# Patient Record
Sex: Female | Born: 1940 | Race: Black or African American | Hispanic: No | Smoking: Former smoker
Health system: Southern US, Community
[De-identification: ages and names within clinical notes are randomized; demographics above are authoritative.]

## PROBLEM LIST (undated history)

## (undated) DIAGNOSIS — I10 Essential (primary) hypertension: Secondary | ICD-10-CM

## (undated) DIAGNOSIS — E079 Disorder of thyroid, unspecified: Secondary | ICD-10-CM

## (undated) DIAGNOSIS — M109 Gout, unspecified: Secondary | ICD-10-CM

## (undated) DIAGNOSIS — E119 Type 2 diabetes mellitus without complications: Secondary | ICD-10-CM

## (undated) DIAGNOSIS — N189 Chronic kidney disease, unspecified: Secondary | ICD-10-CM

## (undated) DIAGNOSIS — E785 Hyperlipidemia, unspecified: Secondary | ICD-10-CM

## (undated) DIAGNOSIS — E039 Hypothyroidism, unspecified: Secondary | ICD-10-CM

## (undated) HISTORY — DX: Hyperlipidemia, unspecified: E78.5

## (undated) HISTORY — DX: Type 2 diabetes mellitus without complications: E11.9

## (undated) HISTORY — DX: Chronic kidney disease, unspecified: N18.9

## (undated) HISTORY — DX: Essential (primary) hypertension: I10

## (undated) HISTORY — DX: Disorder of thyroid, unspecified: E07.9

## (undated) HISTORY — PX: OTHER SURGICAL HISTORY: SHX169

## (undated) HISTORY — PX: TUBAL LIGATION: SHX77

## (undated) HISTORY — DX: Gout, unspecified: M10.9

---

## 1998-09-06 ENCOUNTER — Observation Stay: Admission: RE | Admit: 1998-09-06 | Discharge: 1998-09-08 | Payer: Self-pay | Admitting: *Deleted

## 2005-04-21 ENCOUNTER — Other Ambulatory Visit: Payer: Self-pay

## 2005-04-22 ENCOUNTER — Inpatient Hospital Stay: Payer: Self-pay | Admitting: Internal Medicine

## 2005-04-22 ENCOUNTER — Other Ambulatory Visit: Payer: Self-pay

## 2005-04-24 ENCOUNTER — Other Ambulatory Visit: Payer: Self-pay

## 2007-03-08 ENCOUNTER — Other Ambulatory Visit: Payer: Self-pay

## 2007-03-08 ENCOUNTER — Emergency Department: Payer: Self-pay | Admitting: Emergency Medicine

## 2011-03-25 ENCOUNTER — Emergency Department: Payer: Self-pay

## 2011-03-25 LAB — URINALYSIS, COMPLETE
Bilirubin,UR: NEGATIVE
Blood: NEGATIVE
Glucose,UR: >=500 mg/dL (ref 0–75)
RBC,UR: 1 /HPF (ref 0–5)
Specific Gravity: 1.007 (ref 1.003–1.030)
Squamous Epithelial: 3
WBC UR: 12 /HPF (ref 0–5)

## 2012-04-28 ENCOUNTER — Emergency Department: Payer: Self-pay | Admitting: Emergency Medicine

## 2012-04-28 LAB — CBC WITH DIFFERENTIAL/PLATELET
Basophil #: 0.1 10*3/uL (ref 0.0–0.1)
Basophil %: 0.9 %
Eosinophil #: 0.2 10*3/uL (ref 0.0–0.7)
Eosinophil %: 1.9 %
HCT: 33.9 % — ABNORMAL LOW (ref 35.0–47.0)
Lymphocyte #: 1.6 10*3/uL (ref 1.0–3.6)
Lymphocyte %: 15.8 %
MCH: 26 pg (ref 26.0–34.0)
MCV: 81 fL (ref 80–100)
Monocyte %: 7.4 %
Neutrophil #: 7.6 10*3/uL — ABNORMAL HIGH (ref 1.4–6.5)
Neutrophil %: 74 %
Platelet: 367 10*3/uL (ref 150–440)
RBC: 4.2 10*6/uL (ref 3.80–5.20)
RDW: 15.7 % — ABNORMAL HIGH (ref 11.5–14.5)
WBC: 10.3 10*3/uL (ref 3.6–11.0)

## 2012-04-28 LAB — BASIC METABOLIC PANEL
BUN: 23 mg/dL — ABNORMAL HIGH (ref 7–18)
Calcium, Total: 8.9 mg/dL (ref 8.5–10.1)
Chloride: 111 mmol/L — ABNORMAL HIGH (ref 98–107)
Co2: 23 mmol/L (ref 21–32)
Creatinine: 1.77 mg/dL — ABNORMAL HIGH (ref 0.60–1.30)
EGFR (African American): 33 — ABNORMAL LOW
Potassium: 3.3 mmol/L — ABNORMAL LOW (ref 3.5–5.1)
Sodium: 141 mmol/L (ref 136–145)

## 2012-04-28 LAB — URIC ACID: Uric Acid: 11.6 mg/dL — ABNORMAL HIGH (ref 2.6–6.0)

## 2012-04-28 LAB — SEDIMENTATION RATE: Erythrocyte Sed Rate: 45 mm/hr — ABNORMAL HIGH (ref 0–30)

## 2012-06-27 ENCOUNTER — Inpatient Hospital Stay: Payer: Self-pay | Admitting: Internal Medicine

## 2012-06-27 LAB — URINALYSIS, COMPLETE
Bilirubin,UR: NEGATIVE
Glucose,UR: 150 mg/dL (ref 0–75)
Ketone: NEGATIVE
Ph: 5 (ref 4.5–8.0)
Protein: 30
RBC,UR: 3 /HPF (ref 0–5)
Specific Gravity: 1.011 (ref 1.003–1.030)
Squamous Epithelial: 7
WBC UR: 19 /HPF (ref 0–5)

## 2012-06-27 LAB — COMPREHENSIVE METABOLIC PANEL
Albumin: 3.3 g/dL — ABNORMAL LOW (ref 3.4–5.0)
Alkaline Phosphatase: 191 U/L — ABNORMAL HIGH (ref 50–136)
Anion Gap: 9 (ref 7–16)
BUN: 47 mg/dL — ABNORMAL HIGH (ref 7–18)
EGFR (African American): 19 — ABNORMAL LOW
Glucose: 474 mg/dL — ABNORMAL HIGH (ref 65–99)
Osmolality: 301 (ref 275–301)
Potassium: 4.1 mmol/L (ref 3.5–5.1)
SGOT(AST): 30 U/L (ref 15–37)
SGPT (ALT): 17 U/L (ref 12–78)
Sodium: 134 mmol/L — ABNORMAL LOW (ref 136–145)

## 2012-06-27 LAB — CBC
HCT: 46.3 % (ref 35.0–47.0)
MCH: 25 pg — ABNORMAL LOW (ref 26.0–34.0)
Platelet: 369 10*3/uL (ref 150–440)
RDW: 15.3 % — ABNORMAL HIGH (ref 11.5–14.5)

## 2012-06-27 LAB — TROPONIN I: Troponin-I: 0.16 ng/mL — ABNORMAL HIGH

## 2012-06-28 LAB — CBC WITH DIFFERENTIAL/PLATELET
Eosinophil: 1 %
Lymphocytes: 13 %
MCH: 25 pg — ABNORMAL LOW (ref 26.0–34.0)
MCHC: 31.1 g/dL — ABNORMAL LOW (ref 32.0–36.0)
Platelet: 267 10*3/uL (ref 150–440)
RBC: 4.57 10*6/uL (ref 3.80–5.20)
RDW: 15.7 % — ABNORMAL HIGH (ref 11.5–14.5)
Segmented Neutrophils: 83 %
WBC: 21.4 10*3/uL — ABNORMAL HIGH (ref 3.6–11.0)

## 2012-06-28 LAB — URINE CULTURE

## 2012-06-28 LAB — BASIC METABOLIC PANEL
Anion Gap: 6 — ABNORMAL LOW (ref 7–16)
BUN: 38 mg/dL — ABNORMAL HIGH (ref 7–18)
Calcium, Total: 8.1 mg/dL — ABNORMAL LOW (ref 8.5–10.1)
Chloride: 106 mmol/L (ref 98–107)
Co2: 29 mmol/L (ref 21–32)
Creatinine: 2.26 mg/dL — ABNORMAL HIGH (ref 0.60–1.30)
EGFR (African American): 25 — ABNORMAL LOW
Glucose: 57 mg/dL — ABNORMAL LOW (ref 65–99)
Osmolality: 288 (ref 275–301)
Potassium: 3.8 mmol/L (ref 3.5–5.1)
Sodium: 141 mmol/L (ref 136–145)

## 2012-06-29 LAB — CBC WITH DIFFERENTIAL/PLATELET
Bands: 2 %
Basophil: 2 %
Lymphocytes: 18 %
MCH: 25.8 pg — ABNORMAL LOW (ref 26.0–34.0)
MCHC: 32.1 g/dL (ref 32.0–36.0)
MCV: 80 fL (ref 80–100)
Metamyelocyte: 1 %
Monocytes: 4 %
RDW: 15.9 % — ABNORMAL HIGH (ref 11.5–14.5)
Segmented Neutrophils: 73 %
WBC: 17.3 10*3/uL — ABNORMAL HIGH (ref 3.6–11.0)

## 2012-06-29 LAB — BASIC METABOLIC PANEL
BUN: 18 mg/dL (ref 7–18)
Co2: 25 mmol/L (ref 21–32)
Creatinine: 1.52 mg/dL — ABNORMAL HIGH (ref 0.60–1.30)
EGFR (African American): 40 — ABNORMAL LOW
EGFR (Non-African Amer.): 34 — ABNORMAL LOW
Osmolality: 280 (ref 275–301)
Potassium: 4 mmol/L (ref 3.5–5.1)
Sodium: 141 mmol/L (ref 136–145)

## 2012-06-29 LAB — HEMOGLOBIN A1C

## 2012-06-30 LAB — CREATININE, SERUM
EGFR (African American): 47 — ABNORMAL LOW
EGFR (Non-African Amer.): 41 — ABNORMAL LOW

## 2012-07-03 LAB — CULTURE, BLOOD (SINGLE)

## 2012-08-20 ENCOUNTER — Ambulatory Visit: Payer: Self-pay | Admitting: Family Medicine

## 2013-05-04 ENCOUNTER — Inpatient Hospital Stay: Payer: Self-pay | Admitting: Internal Medicine

## 2013-05-04 LAB — URINALYSIS, COMPLETE
BACTERIA: NONE SEEN
Bilirubin,UR: NEGATIVE
Glucose,UR: >=500 mg/dL (ref 0–75)
Nitrite: NEGATIVE
Ph: 5 (ref 4.5–8.0)
Protein: NEGATIVE
RBC,UR: 29 /HPF (ref 0–5)
Specific Gravity: 1.015 (ref 1.003–1.030)
WBC UR: 38 /HPF (ref 0–5)

## 2013-05-04 LAB — CBC
HCT: 44.9 % (ref 35.0–47.0)
HGB: 13.6 g/dL (ref 12.0–16.0)
MCH: 27.2 pg (ref 26.0–34.0)
MCHC: 30.3 g/dL — AB (ref 32.0–36.0)
MCV: 90 fL (ref 80–100)
Platelet: 233 10*3/uL (ref 150–440)
RBC: 5 10*6/uL (ref 3.80–5.20)
RDW: 15.7 % — AB (ref 11.5–14.5)
WBC: 14.2 10*3/uL — ABNORMAL HIGH (ref 3.6–11.0)

## 2013-05-04 LAB — BASIC METABOLIC PANEL
ANION GAP: 12 (ref 7–16)
BUN: 60 mg/dL — AB (ref 7–18)
Calcium, Total: 9.5 mg/dL (ref 8.5–10.1)
Chloride: 104 mmol/L (ref 98–107)
Co2: 18 mmol/L — ABNORMAL LOW (ref 21–32)
Creatinine: 2.84 mg/dL — ABNORMAL HIGH (ref 0.60–1.30)
EGFR (Non-African Amer.): 16 — ABNORMAL LOW
GFR CALC AF AMER: 19 — AB
Glucose: 819 mg/dL (ref 65–99)
Osmolality: 325 (ref 275–301)
Potassium: 4.7 mmol/L (ref 3.5–5.1)
Sodium: 134 mmol/L — ABNORMAL LOW (ref 136–145)

## 2013-05-04 LAB — COMPREHENSIVE METABOLIC PANEL
Albumin: 3.5 g/dL (ref 3.4–5.0)
Alkaline Phosphatase: 192 U/L — ABNORMAL HIGH
Anion Gap: 13 (ref 7–16)
BUN: 62 mg/dL — AB (ref 7–18)
Bilirubin,Total: 0.6 mg/dL (ref 0.2–1.0)
CALCIUM: 8.6 mg/dL (ref 8.5–10.1)
Chloride: 99 mmol/L (ref 98–107)
Co2: 14 mmol/L — ABNORMAL LOW (ref 21–32)
Creatinine: 2.83 mg/dL — ABNORMAL HIGH (ref 0.60–1.30)
EGFR (African American): 19 — ABNORMAL LOW
EGFR (Non-African Amer.): 16 — ABNORMAL LOW
Glucose: 1054 mg/dL (ref 65–99)
OSMOLALITY: 324 (ref 275–301)
POTASSIUM: 5.6 mmol/L — AB (ref 3.5–5.1)
SGOT(AST): 23 U/L (ref 15–37)
SGPT (ALT): 13 U/L (ref 12–78)
SODIUM: 126 mmol/L — AB (ref 136–145)
Total Protein: 7.4 g/dL (ref 6.4–8.2)

## 2013-05-04 LAB — BETA-HYDROXYBUTYRIC ACID: Beta-Hydroxybutyrate: 22 mg/dL — ABNORMAL HIGH (ref 0.2–2.8)

## 2013-05-04 LAB — LIPASE, BLOOD: LIPASE: 49 U/L — AB (ref 73–393)

## 2013-05-04 LAB — TROPONIN I: Troponin-I: <0.02 ng/mL

## 2013-05-05 LAB — BASIC METABOLIC PANEL
Anion Gap: 5 — ABNORMAL LOW (ref 7–16)
BUN: 55 mg/dL — ABNORMAL HIGH (ref 7–18)
CO2: 19 mmol/L — AB (ref 21–32)
Calcium, Total: 9.2 mg/dL (ref 8.5–10.1)
Chloride: 119 mmol/L — ABNORMAL HIGH (ref 98–107)
Creatinine: 2.39 mg/dL — ABNORMAL HIGH (ref 0.60–1.30)
EGFR (African American): 23 — ABNORMAL LOW
EGFR (Non-African Amer.): 20 — ABNORMAL LOW
Glucose: 246 mg/dL — ABNORMAL HIGH (ref 65–99)
Osmolality: 308 (ref 275–301)
POTASSIUM: 4.2 mmol/L (ref 3.5–5.1)
Sodium: 143 mmol/L (ref 136–145)

## 2013-05-06 LAB — HEMOGLOBIN A1C

## 2013-05-07 LAB — BASIC METABOLIC PANEL
ANION GAP: 5 — AB (ref 7–16)
BUN: 18 mg/dL (ref 7–18)
CHLORIDE: 116 mmol/L — AB (ref 98–107)
CO2: 21 mmol/L (ref 21–32)
Calcium, Total: 8.7 mg/dL (ref 8.5–10.1)
Creatinine: 1.76 mg/dL — ABNORMAL HIGH (ref 0.60–1.30)
EGFR (African American): 33 — ABNORMAL LOW
EGFR (Non-African Amer.): 29 — ABNORMAL LOW
Glucose: 195 mg/dL — ABNORMAL HIGH (ref 65–99)
OSMOLALITY: 290 (ref 275–301)
POTASSIUM: 4.8 mmol/L (ref 3.5–5.1)
SODIUM: 142 mmol/L (ref 136–145)

## 2013-11-03 LAB — CBC AND DIFFERENTIAL
HCT: 37 % (ref 36–46)
Hemoglobin: 11.6 g/dL — AB (ref 12.0–16.0)
Platelets: 326 10*3/uL (ref 150–399)
WBC: 9 10^3/mL

## 2014-04-13 DIAGNOSIS — I10 Essential (primary) hypertension: Secondary | ICD-10-CM | POA: Diagnosis not present

## 2014-04-13 DIAGNOSIS — N183 Chronic kidney disease, stage 3 (moderate): Secondary | ICD-10-CM | POA: Diagnosis not present

## 2014-04-13 DIAGNOSIS — D649 Anemia, unspecified: Secondary | ICD-10-CM | POA: Diagnosis not present

## 2014-04-13 DIAGNOSIS — E1122 Type 2 diabetes mellitus with diabetic chronic kidney disease: Secondary | ICD-10-CM | POA: Diagnosis not present

## 2014-05-18 DIAGNOSIS — I10 Essential (primary) hypertension: Secondary | ICD-10-CM | POA: Diagnosis not present

## 2014-05-18 DIAGNOSIS — N183 Chronic kidney disease, stage 3 (moderate): Secondary | ICD-10-CM | POA: Diagnosis not present

## 2014-05-18 DIAGNOSIS — R809 Proteinuria, unspecified: Secondary | ICD-10-CM | POA: Diagnosis not present

## 2014-05-18 DIAGNOSIS — E1122 Type 2 diabetes mellitus with diabetic chronic kidney disease: Secondary | ICD-10-CM | POA: Diagnosis not present

## 2014-06-29 DIAGNOSIS — N183 Chronic kidney disease, stage 3 (moderate): Secondary | ICD-10-CM | POA: Diagnosis not present

## 2014-06-29 DIAGNOSIS — E1022 Type 1 diabetes mellitus with diabetic chronic kidney disease: Secondary | ICD-10-CM | POA: Diagnosis not present

## 2014-06-29 DIAGNOSIS — E039 Hypothyroidism, unspecified: Secondary | ICD-10-CM | POA: Diagnosis not present

## 2014-07-01 ENCOUNTER — Other Ambulatory Visit: Payer: Self-pay | Admitting: *Deleted

## 2014-07-01 ENCOUNTER — Encounter: Payer: Self-pay | Admitting: *Deleted

## 2014-07-01 NOTE — Patient Outreach (Signed)
[facility]) Care Management  07/01/2014  Caitlyn Marshall 1940/04/25 [id number]   [employer] Tier 4 referral:   Telephone call to patient. Advised patient of reason for call and of [facility] care management services.  States she has received [facility] brochure and magnet with contact numbers.  States seeing diabetic doctor several times yearly and gets blood work done every 3 months.  Seeing primary care doctor for regular checks 1-2 times yearly. Family member takes her to doctors' appointments.   Uses local pharmacy for medications. States taking medications as prescribed by her doctors.  Patient states she has attended diabetic education classes in the past.  Knows action plan for low blood sugar.  States blood sugars have been within normal limits. Checking three times daily.   Patient states she has no health care concerns currently and does not need [facility] services at this time.  Patient advised of [facility] contact numbers, including 24 hour Nurse Advice line. Patient voices understanding.  Will close case. Send closure letter to MD.   Sherrin Daisy, [place] Management Coordinator [facility] Care Management  [phone]

## 2014-07-08 NOTE — Consult Note (Signed)
PATIENT NAME:  Caitlyn Marshall, Caitlyn Marshall MR#:  [id number] DATE OF BIRTH:  November 10, 1940  DATE OF CONSULTATION:  06/30/2012  REFERRING PHYSICIAN:  Gladstone Lighter, MD CONSULTING PHYSICIAN:  A. Lavone Orn, MD  CHIEF COMPLAINT: Uncontrolled diabetes.   HISTORY OF PRESENT ILLNESS: This is a 74 year old female seen in consultation at the request of Dr. Tressia Miners for uncontrolled diabetes. The patient was admitted on 06/28/2011 with initial findings of elevated blood sugar of over 500, hypotension, elevated white count and acute renal failure. She had been experiencing several weeks of dysphagia. She has since been undergone an EGD showing esophageal candidiasis. Dysphagia has had improved in the last day or two. She is now able to eat a little bit more. She estimates she is eating about half the amount she was prior to hospitalization, when she was feeling well.  She has lost 10 pounds in recent weeks due to dysphagia. She has had diabetes since 1998. She typically takes Novolin 70/30, 24 units at breakfast and 14 units at supper. Her primary care physician is Dr. Margarita Rana. Her initial hemoglobin A1c was found to be greater than 16%. She checks her blood sugars 3 to 4 times per week. She denies having had hypoglycemia prior to hospitalization. Since hospitalization, she had been on a regimen of 70/30 mix 24 units at breakfast and 14 units at bedtime. For the last several mornings, she has woken with fasting hypoglycemia. She has also had a problem with afternoon and evening highs. The last 24 hours, blood sugars have ranged from 63 to 322 and she has received a total of 38 units of 70/30 mix in addition to 14 units of NovoLog.   PAST MEDICAL HISTORY: 1.  Type 2 diabetes.  2.  Hypertension.  3.  Hyperlipidemia.  4.  Pancreatitis.  5.  Gout.   SOCIAL HISTORY: No tobacco or illicit drug use.   FAMILY HISTORY: Positive for diabetes.   CURRENT INPATIENT MEDICATIONS:  1.  Novolin 70/30 mix 24 units at  breakfast and 14 units at bedtime.  2.  Lovastatin 20 mg daily.  3.  Levothyroxine 50 mcg daily.  4.  Pantoprazole 40 mg daily.  5.  Aspirin 81 mg daily.  6.  Ceftriaxone 1 gram daily.  7.  Diflucan 100 mg daily.  8.  D5 NS at 75 mL/hour.   9.  NovoLog insulin sliding scale.   REVIEW OF SYSTEMS:  GENERAL: She has had weight loss. She denies fevers.  HEENT: She denies blurred vision. She does have dysphagia.  NECK: She has no neck pain.  CARDIAC: Denies chest pain or palpitation.  PULMONARY: Denies cough or shortness of breath.  ABDOMEN: Denies abdominal pain. Denies change in bowel habits.  EXTREMITIES: Denies leg swelling.  NEUROLOGIC:  She denies numbness or paresthesias in the extremities. She denies recent falls.  SKIN: Denies rash or recent skin changes.  ENDOCRINE:  Denies heat or cold intolerance.   PHYSICAL EXAMINATION: VITAL SIGNS: Height 65.9 inches, weight 91.6 pounds, BMI 14.8.  GENERAL: Thin African American female in no acute distress.  HEENT: Extraocular movements are intact.  Oropharynx is clear.  Mucous membranes moist.  NECK: Supple. No thyromegaly.  CARDIAC: Regular rate and rhythm without murmur.  PULMONARY: Clear to auscultation bilaterally. No wheeze.  ABDOMEN: Diffusely soft, nontender, nondistended.  EXTREMITIES: No edema is present.  PSYCHIATRIC: Alert and oriented x 3, calm and cooperative.  SKIN: No rash or dermatopathy noted.   LABORATORY DATA: Creatinine 1.32, EGFR 47, WBC 14.7. On  06/27/2012, hemoglobin A1c 15.8%.   ASSESSMENT: Severely uncontrolled diabetes with A1c of over 15.8%, currently with both hyper and hypoglycemia.  I suspect that her decreased oral intake as well as bedtime dosing of 70/30 mix have both contributed to hypoglycemia, whereas recent addition of D5 has contributed to hyperglycemia.   RECOMMENDATIONS: 1. Will discontinue her D5 NS.  2. Will hold her bedtime 70/30 mix tonight.  3. Tomorrow, we will change 70/30 mix 20 units  in the morning and 7 units evening, both to be given at meals.  4.  Continue NovoLog sliding scale.  5. Continue before meals and at bedtime blood sugar monitoring. After discharge, she should also be encouraged to check blood sugars on a regular basis, preferably at least twice a day.   Thank you for the kind request for consultation. I will follow along with you.   ____________________________ A. Lavone Orn, MD ams:cc D: 06/30/2012 17:13:21 ET T: 06/30/2012 18:05:36 ET JOB#: [id number]  cc: A. Lavone Orn, MD, <Dictator> Sherlon Handing MD ELECTRONICALLY SIGNED 07/05/2012 8:20

## 2014-07-08 NOTE — Consult Note (Signed)
CC: dysphagia.  Pt with problem of food hanging up in mid and lower esophagus, also hx of heartburn and reflux.  She may have esophageal reflux induced strictures.  Plan EGD tomorrow.  May need colonoscopy later due to abn CT scan.  Full consult to follow tomorrow morning and EGD later tomorrow.  Electronic Signatures: Manya Silvas (MD)  (Signed on 13-Apr-14 16:58)  Authored  Last Updated: 13-Apr-14 16:58 by Manya Silvas (MD)

## 2014-07-08 NOTE — H&P (Signed)
PATIENT NAME:  Caitlyn Marshall, Caitlyn Marshall MR#:  [id number] DATE OF BIRTH:  06-01-1940  DATE OF ADMISSION:  06/27/2012  PRIMARY CARE PHYSICIAN: Jerrell Belfast, MD  REFERRING PHYSICIAN: Sheryl L. Benjaman Lobe, MD  CHIEF COMPLAINT: High blood sugar today.   HISTORY OF PRESENT ILLNESS: The patient is a 74 year old African American female with a history of hypertension, diabetes, questionable chronic pancreatitis, hyperlipidemia, gout, presented to the ED with a high blood sugar today. Blood sugar was 530 today. In addition, the patient complains of abdominal pain, nausea, vomiting multiple times. Actually the patient has poor oral intake recently, feels generalized weakness, but the patient denies any fever or chills. No headache but has some dizziness, but denies any diarrhea, melena or bloody stool. No dysuria or hematuria. The patient denies any cough, phlegm, shortness of breath. She was noted to have a high white count of 23, was treated with Zosyn. Since the patient's blood pressure was on the lower side, the patient was treated with normal saline 2 liters in ED.   PAST MEDICAL HISTORY: Hypertension, diabetes, chronic pancreatitis, hyperlipidemia, gout.   FAMILY HISTORY: Hypertension and diabetes.   SOCIAL HISTORY: No smoking or drinking or illicit drugs.   HOME MEDICATIONS: Tribenzor 10 mg/25 mg p.o. daily, Novolin 70/30 with 24 units subcutaneously in the morning and 14 units at bedtime, lovastatin 20 mg p.o. daily at bedtime, levothyroxine 50 mcg p.o. daily, aspirin 81 mg p.o. daily.   REVIEW OF SYSTEMS:    CONSTITUTIONAL: The patient denies any fever or chills. No headache but has dizziness and generalized weakness and poor oral intake.  EYES: No double vision or blurred vision.  EARS, NOSE, THROAT: No postnasal drip, slurred speech or dysphagia. No epistaxis.  CARDIOVASCULAR: No chest pain, palpitation, orthopnea or nocturnal dyspnea. No leg edema.  PULMONARY: No cough, sputum, shortness of breath  or hemoptysis.  GASTROINTESTINAL: Positive for abdominal pain, nausea, vomiting, but no diarrhea, melena or bloody stool.  GENITOURINARY: No dysuria, hematuria or incontinence.  SKIN: No rash or jaundice.  NEUROLOGIC: No syncope, loss of consciousness or seizure.  HEMATOLOGIC: No easy bruising or bleeding.  ENDOCRINE: No polyuria, polydipsia, heat or cold intolerance.   PHYSICAL EXAMINATION:  VITAL SIGNS: Temperature 97.7, blood pressure 82/54, pulse 90, O2 saturation 99% on room air.  GENERAL: This patient is alert, awake, oriented, in no acute distress.  HEENT: Pupils round, equal, reactive to light and accommodation. Mouth: Dry oral mucosa. Clear oropharynx.  NECK: Supple. No JVD or carotid bruits. No lymphadenopathy. No thyromegaly.  CARDIOVASCULAR: S1, S2 regular rate, rhythm. No murmurs, gallops.  PULMONARY: Bilateral air entry. No wheezing or rales. No use of accessory muscles to breathe.  ABDOMEN: Soft. No distention or tenderness. No organomegaly. Bowel sounds present  EXTREMITIES: No edema, clubbing or cyanosis. No calf tenderness. Strong bilateral pedal pulses.  SKIN: No rash or jaundice.  NEUROLOGIC: A and O x 3. No focal deficits. Power 5/5. Sensation intact.   LABORATORY, DIAGNOSTIC AND RADIOLOGICAL DATA: CAT scan of abdomen and pelvis without contrast: No acute findings in the abdomen or pelvis. Findings of chronic pancreatitis and there is a focal area of mild bowel wall thickening in the transverse colon. Urinalysis showed WBC 19, RBC 3, nitrite negative. Abdominal ultrasound: No acute findings of gallbladder. Chest x-ray unremarkable, no acute cardiopulmonary disease. WBC 23.8, hemoglobin 14.4, platelets 369. Glucose 474 and then decreased to 142, BUN 47, creatinine 2.84, sodium 134, potassium 4.1, chloride 96, bicarbonate 29. Troponin 0.16. TSH 3.19. BNP 3367. Hemoglobin  A1c more than 16. Lipase 31. EKG showed normal sinus rhythm at 88 BPM.   IMPRESSION:  1.  Hyperglycemia.  uncontrolled diabetes.  2.  Abdominal pain, possible chronic pancreatitis.  3.  Hypotension.  4.  Acute renal failure with dehydration, questionable chronic kidney disease. The patient's creatinine was 1.77 and this time increased to 2.84. BUN was 23 2 months ago and now is 47.  5.  Urinary tract infection.  6.  Leukocytosis.  7.  Elevated troponin, which is possibly due to acute renal failure.  8.  History of hypertension.  9.  Hyperlipidemia.  10.  History of gout.   PLAN OF TREATMENT:  1.  The patient will be admitted to medical floor. We will give normal saline bolus, then 100 mL/hour if blood pressure is stable and follow up BMP. Hold Tribenzor due to hypotension and acute renal failure.  2.  For UTI, we will start Rocephin. Follow up blood culture, urine culture, CBC.  3.  For elevated troponin, which is possibly due to acute renal failure, we will follow up troponin. Continue aspirin and statin. 4.  For chronic pancreatitis and thickening of colon wall, we will get a GI consult.  5.  For hyperglycemia and uncontrolled diabetes, we will start sliding scale. Continue Novolin 70/30. Closely monitor her blood sugar and adjust insulin.  6.  GI and DVT prophylaxis.  7.  I discussed the patient's condition and the plan of treatment with the patient and the patient's family member, also discussed the patient's code status. The patient wants full code.   TIME SPENT: About 62 minutes.   ____________________________ Demetrios Loll, MD qc:jm D: 06/27/2012 16:41:01 ET T: 06/27/2012 17:41:27 ET JOB#: [id number]  cc: Demetrios Loll, MD, <Dictator> Demetrios Loll MD ELECTRONICALLY SIGNED 06/28/2012 17:26

## 2014-07-08 NOTE — Consult Note (Signed)
Pt CC is dysphagia.  She was dilated and started on medicine for candida.  Please continue this for 7-10 days.  I will sign off.  Electronic Signatures: Manya Silvas (MD)  (Signed on 15-Apr-14 17:22)  Authored  Last Updated: 15-Apr-14 17:22 by Manya Silvas (MD)

## 2014-07-08 NOTE — Consult Note (Signed)
Chief Complaint and History:  Referring Physician Dr. Tressia Miners   Chief Complaint Uncontrolled diabetes   Allergies:  No Known Allergies:   Assessment/Plan:  Assessment/Plan yo yo F with a 32 yr h/o diabetes was admitted with several weeks of dysphagia due to candida esophagitis. Hgb A1c on admit was 15.8%. Home regimen is 70/30 Mix 24 units at breakfast and 14 units at supper. She was given the 70/30 Mix 24 units in AM and HS here and was having fasting lows in the 60s. D5 added yesterday and sugars in evenings are high in the 250-320 range. She is eating some, but not regular amount. She is quite underweight.  A/ Uncontrolled diabetes with hypoglycemia  P/ Agree with encouraging inc'd PO intake Stop D5 NS Change PM dose of 70/30 Mix to supper. Will hold today and restart tomorrow PM. Reduce AM dose to 20 units. Continue NovoLog SSI.  I will follow with you and am available for out-pt follow-up also, if needed. Full consult has been dictated.   Case Discussed With patient   Electronic Signatures: Judi Cong (MD)  (Signed 15-Apr-14 17:21)  Authored: Chief Complaint and History, ALLERGIES, Assessment/Plan   Last Updated: 15-Apr-14 17:21 by Judi Cong (MD)

## 2014-07-08 NOTE — Discharge Summary (Signed)
PATIENT NAME:  Caitlyn Marshall, Caitlyn Marshall MR#:  [id number] DATE OF BIRTH:  10/09/1940  DATE OF ADMISSION:  06/27/2012 DATE OF DISCHARGE:  07/02/2012  ADMITTING PHYSICIAN:  Demetrios Loll, MD  DISCHARGING PHYSICIAN:  Gladstone Lighter, MD  PRIMARY CARE PHYSICIAN: Margarita Rana, MD  [place]: 1.  GI consultation by Dr. Vira Agar and Dr. Candace Cruise. 2.  Endocrinology consultation with Dr. Gabriel Carina.  DISCHARGE DIAGNOSES: 1.  Dysphagia secondary to Candida esophagitis on esophagogastroduodenoscopy. 2.  Uncontrolled diabetes mellitus with hemoglobin A1c of 15.8 with hypo and hyperglycemic episodes in the hospital.  3.  Urinary tract infection.  4.  Hypertension.  5.  Hypothyroidism.  6.  Chronic kidney disease, stage III.  DISCHARGE MEDICATIONS:  1.  Levothyroxine 50 mcg a few daily.  2.  Aspirin 81 mg p.o. daily.  3.  Fluconazole 100 mg p.o. daily for 8 more days.  4.  Protonix 40 mg p.o. daily.  5.  Tribenzor 10/25, 40 mg half tablet p.o. daily.  6.  Lantus 8 units subcutaneous at bedtime.  7.  Aspart insulin 6 units 3 times a day before meals.  8.  Aspart insulin protamine sliding scale insulin 1 unit for every 50 units increase 50 three times a day with meals.   DISCHARGE DIET: ADA 1800 diet.   DISCHARGE ACTIVITY: As tolerated. Dietary supplements Glucerna twice a day.    FOLLOWUP INSTRUCTIONS: 1.  GI follow up in 2 weeks.  Will need outpatient colonoscopy. 2.  Follow up with Dr. Gabriel Carina.  Appointment scheduled for 07/22/2012 at 11:15 a.m.  3.  PCP follow-up in 2 to 3 weeks.   LABORATORY AND DIAGNOSTIC DATA:  His hemoglobin A1C as mentioned above is 15.8.  Budding yeast from upper endoscopy brushings seen. Sodium 141, potassium 4.0, chloride 11, bicarb 25, BUN 18, creatinine 1.5, glucose 49 and calcium of 7.9.  WBC 17.3, hemoglobin 10.9, hematocrit 34.1, platelet count 255. WBC normalized to 11.4 at the time of discharge.   BRIEF HOSPITAL COURSE:  The patient is a 74 year old elderly  African American female with past medical history significant for diabetes, hypertension and chronic pancreatitis who follows with Dr. Venia Minks as an outpatient and presented to the hospital secondary to couple of weeks of weakness, poor p.o. intake, dysphagia and also elevated blood sugars at home.   1.  Dysphagia with poor p.o. intake going on for a couple of weeks. Seen by GI, had an upper GI endoscopy done; no strictures were found but she did have a diffuse Candida esophagitis and was started on fluconazole.  Still has pain on swallowing but much improved and she is recommended to have an outpatient follow-up for the same.  Also of note, she was noted to have thickening of her transverse colon on the CT of the abdomen done and never had a colonoscopy done. The patient is reluctant to get a colonoscopy done at this time but outpatient follow-up is recommended and she is agreeable for that. Also with the weight loss, failure to thrive, poor appetite, colon cancer need to be ruled out.  2.  Diabetes mellitus with hypo and hyperglycemic episodes while in the hospital. The patient's sugars were erratic, ranging from hypoglycemia in the 30s to hyperglycemia in the 500s. Her hemoglobin A1c is elevated at 15.8.  Sugars were hard to control with 70/30 insulin.  She was seen by endocrinologist, Dr. Gabriel Carina who adjusted her insulin and changed it to Lantus and also premeal insulin with sliding scale insulin.  The patient  said that she would be comfortable taking as she has been on insulin for several years. Prescription for new glucometer strips and Lantus is given and she has a follow-up appointment with Dr. Gabriel Carina in the next few weeks.  3.  Hypertension.  Her home medication was continued at half dose because her blood pressure has been stable while in the hospital.  4.  Hypothyroidism.  Continue her Synthroid.  5. Chronic kidney disease, stage III, appeared stable here. 6.  Urinary tract infection. The patient's  cultures were negative. The patient finished a 5-days course of Rocephin while in the hospital.   DISCHARGE CONDITION: Stable.   DISCHARGE DISPOSITION: Home.   Time spent on discharge: 40 minutes.  Also of note, the patient refused home health.  At the time of discharge she was able to ambulate fine.     ____________________________ Gladstone Lighter, MD rk:ct D: 07/02/2012 15:37:50 ET T: 07/02/2012 17:03:54 ET JOB#: [id number]  cc: Gladstone Lighter, MD, <Dictator> Jerrell Belfast, MD Gladstone Lighter MD ELECTRONICALLY SIGNED 07/03/2012 15:54

## 2014-07-08 NOTE — Consult Note (Signed)
EGD done for Dr. Vira Agar. Prob candidal esophagitis. Cytology brushings done. Start diflucan daily x 5 days. Hold lovastatin while getting diflucan since there is major drug interaction. Though no obvious esophageal stricture, 54 Fr maloney dilator passed. Suspect esophagitis cause of dysphagia. 1800 cal diet ordered. Dr. Vira Agar to see patient tomorrow. Thanks.  Electronic Signatures: Verdie Shire (MD)  (Signed on 14-Apr-14 12:59)  Authored  Last Updated: 14-Apr-14 12:59 by Verdie Shire (MD)

## 2014-07-08 NOTE — Consult Note (Signed)
PATIENT NAME:  Caitlyn Marshall, Caitlyn Marshall MR#:  [id number] DATE OF BIRTH:  July 07, 1940  DATE OF CONSULTATION:  06/29/2012  REFERRING PHYSICIAN:   CONSULTING PHYSICIAN:  Manya Silvas, MD  HISTORY OF PRESENT ILLNESS:  The patient is a 74 year old black female who was admitted to the hospital with very elevated blood sugar of 530. Also complaining of abdominal pain, nausea and multiple of vomiting spells with poor oral intake recently She had leukocytosis and was admitted to the hospital for all these problems. I was asked to see her because of history of chronic pancreatitis and problems with dysphasia. She also had a CAT scan that showed a focal area of mild bowel wall thickening in the transverse colon and it has been more than 10 years since she has had an upper endoscopy or colonoscopy so I was consulted to see her.   PAST MEDICAL HISTORY: Diabetes since 1998, chronic pancreatitis, hyperlipidemia, gout and hypertension.   HABITS: Used to drink vodka 1/2 pint or more a day, quit several years ago.  Quit smoking cigarettes 15 to 20 years ago.   HOME MEDICATIONS: 1.  Tribenzor 10/25 mg 1 a day.  2.  Novolin insulin 24 units of 70/30 in the morning and 14 units at bedtime.  3.  Lovastatin 20 mg at bedtime.  4.  Levothyroxine 50 mcg a day.  5.  Aspirin 81 mg a day.   REVIEW OF SYSTEMS:  No chills or fever. Some dizziness and weakness. She has had decreased oral intake and maybe lost 10 pounds over the last several weeks. She had vomiting a couple of days ago. She has vomiting off and on. No asthma or wheezing. No chest pains. She runs a somewhat low blood pressure, however.  No dysuria or hematuria.   PHYSICAL EXAMINATION:  GENERAL:  A thin black female in no acute distress, pleasant.  HEENT: Sclerae anicteric. Conjunctivae negative. Tongue negative. Head is atraumatic. Trachea is in the midline.  LUNGS:  Chest is clear.  HEART: Normal S1 and S2.  ABDOMEN: Soft, nontender. No hepatosplenomegaly. No  masses. No bruits.  EXTREMITIES: No edema.  SKIN: Warm and dry.  NEUROLOGIC: The patient is alert and oriented.   LABORATORY DATA:  White count 23.8, hemoglobin 14.4 and platelet count 369. Glucose 474, decreased to 142. BUN 47 and creatinine 2.84. Troponin 0.16. TSH 3.19.  BNP 3367.  Hemoglobin A1c more than 16.   ASSESSMENT:  1.  Dysphagia, possible esophageal stricture, possible spasm of esophagus. Plan to do an upper endoscopy this morning. Will need a colonoscopy eventually.  2.  Acute renal insufficiency with deterioration over time.  3.  Leukocytosis, etiology uncertain.  4.  History of hypertension.  6.  History of gout.  7.  Elevated troponin likely due to renal insufficiency.   PLAN: Upper endoscopy this morning and colonoscopy to follow in the future.  ____________________________ Manya Silvas, MD rte:sb D: 06/29/2012 07:36:02 ET T: 06/29/2012 07:59:06 ET JOB#: [id number]  cc: Manya Silvas, MD, <Dictator> Manya Silvas MD ELECTRONICALLY SIGNED 07/29/2012 14:12

## 2014-07-09 NOTE — Consult Note (Signed)
PATIENT NAME:  Caitlyn Marshall, Caitlyn Marshall MR#:  [id number] DATE OF BIRTH:  [birth year]  DATE OF CONSULTATION:  05/05/2013  REFERRING PHYSICIAN:  Bettey Costa, MD CONSULTING PHYSICIAN:  A. Lavone Orn, MD  PRIMARY CARE PROVIDER:  Margarita Rana, MD  CHIEF COMPLAINT: Hyperglycemia.   HISTORY OF PRESENT ILLNESS: This is a 74 year old female with a history of insulin-dependent diabetes, hyperlipidemia, hypothyroidism, chronic pancreatitis, who presented to the ED with complaints of weakness, increased thirst, polyuria.  She has experienced about a 7 pound weight loss in the last 2 weeks. Over the last 2 days, she has had nausea and vomiting.  She was found to have an initial serum blood sugar of 1054, low sodium of 126, elevated potassium of 5.6, acute on chronic renal insufficiency with a creatinine of 2.8, and a low bicarbonate of 14 consistent with metabolic acidosis. She did not have an elevated anion gap. She was initiated on IV insulin and IV fluids and this led to significant improvement in her blood sugars. This morning, IV insulin was stopped and she was started on subcutaneous Levemir 10 units. Over the course of the day, her blood sugars have steadily increased and the last blood sugar was greater than 500. In addition to Levemir over the course of the day, she has been given a NovoLog insulin sliding scale. She is receiving a diet. The patient states she has had a good appetite and eating much of the food tray.   The patient is well known to me from a prior hospitalization and clinic followup. She was last seen in clinic in August 2014. She has chronically uncontrolled diabetes. Last hemoglobin A1c, on 02/16/2013, was 10.8%. She has no known complications from diabetes. Her prescribed outpatient insulin regimen is Lantus 7 units at bedtime and NovoLog 6 units t.i.d. a.c., in addition to a sliding scale of 1 unit per 50 over a target of 150. She tells she might miss a dose of her NovoLog once in a while and  she is not taking her sliding scale insulin at all. She states blood sugars have been running high for about 3 to 4 weeks. She has not notified me as she apparently had a clinic visit scheduled for today and was waiting for that visit to discuss her diabetes. Because her sugars are running high, she states she had fears of taking so much insulin. She thought that if she took less insulin maybe this would improve her blood sugars.  PAST MEDICAL HISTORY: 1.  Diabetes mellitus, insulin-dependent.  2.  Hypertension.  3.  Hyperlipidemia.  4.  Chronic pancreatitis.  5.  Gout.  6.  History of hospitalization for esophageal candidiasis, 06/2012.  7.  Hypothyroidism.   OUTPATIENT MEDICATIONS: 1.  Lantus 7 units at bedtime.  2.  NovoLog 6 units t.i.d. a.c. plus sliding scale.  3.  Protonix 40 mg daily.  4.  Tribenzor 40/10/25, 1 tablet daily.  5.  Lovastatin 20 mg at bedtime.  6.  Aspirin 81 mg daily.  7.  Levothyroxine 50 mcg daily.   SOCIAL HISTORY: The patient lives with her 63 year old son. She denies use of tobacco or illicit drugs.   FAMILY HISTORY: Positive for diabetes.   ALLERGIES: No known drug allergies.     REVIEW OF SYSTEMS:   GENERAL: She has had weight loss, as per HPI. No fever.  HEENT: She reports blurred vision. She reports dry mouth.  NECK: Denies neck pain. She does report sore throat.  ENDOCRINE:  Denies heat or  cold intolerance.  CARDIAC: Denies chest pain. Denies palpitations.  PULMONARY: Denies cough. Denies shortness of breath.  GASTROINTESTINAL: Reports nausea with vomiting. Reports good appetite.  GENITOURINARY: Denies dysuria. Does report polyuria.  EXTREMITIES: Denies leg swelling.  SKIN: Denies recent rash or skin changes.  HEMATOLOGIC: Denies easy bruisability or recent bleeding.   PHYSICAL EXAMINATION: VITAL SIGNS: Height 70 inches, weight 105 pounds, BMI 15.1. Temperature 98.6, pulse 79, respirations 18, blood pressure 122/65, pulse ox 100% on room air.   GENERAL: Underweight African American female, disheveled, no acute distress.  HEENT: EOMI.  Oropharynx is clear. Mucous membranes dry.  NECK: Supple. No neck tenderness.  CARDIAC: Regular rate and rhythm. No arrhythmia.  PULMONARY: Clear to auscultation bilaterally. No wheeze.  ABDOMEN: Diffusely soft, nontender.  EXTREMITIES: No peripheral edema is present, 2+ DP pulses are present.  SKIN: No rash or dermatopathy is present.  PSYCHIATRIC: Calm, cooperative.  NEUROLOGIC: Alert and oriented.   LABORATORY: Glucose 246, BUN 55, creatinine 2.39, bicarb 19, anion gap 5, calcium 9.2.   ASSESSMENT: A 74 year old female with severely uncontrolled long-standing insulin-dependent diabetes who has had likely inadequate insulin delivery despite high blood sugars going on at least several weeks. Cause of severe hyperglycemia not clear. No evidence of infection at this time.   RECOMMENDATIONS: 1.  For severely high sugar now, the patient will be given 20 units of NovoLog.  2.  Restart her prandial insulin. Recommend scheduled NovoLog 7 units t.i.d. a.c.  3.  Recommend NovoLog insulin sliding scale, 1 unit if blood sugar 150 to 200 and an additional 2 units per 50 over a target of 201.  4.  We will re-dose Levemir 10 units tonight, could consider increasing her long-standing basal insulin if needed.  5.  Counseled the patient about how high sugars need to be treated with more insulin. She seems to lack understanding about diabetes. I will request a diabetic educator to see her during this hospitalization as well.   The patient will need close outpatient followup, which I can arrange at discharge. In the meantime, I will continue to follow along with you.    ____________________________ A. Lavone Orn, MD ams:dmm D: 05/05/2013 21:27:19 ET T: 05/05/2013 21:48:43 ET JOB#: [id number]  cc: A. Lavone Orn, MD, <Dictator> Sherlon Handing MD ELECTRONICALLY SIGNED 05/08/2013 8:48

## 2014-07-09 NOTE — Discharge Summary (Signed)
PATIENT NAME:  SELA, FALK MR#:  [id number] DATE OF BIRTH:  03/23/1940  PRESENTING COMPLAINT: Weakness and uncontrolled diabetes along with loss of appetite.   DISCHARGE DIAGNOSES: 1. Uncontrolled type 2 diabetes, improved.  2. Acute renal failure due to dehydration.   CODE STATUS: FULL CODE.   MEDICATIONS: 1. Aspirin 81 mg daily.  2. Levothyroxine 50 mcg p.o. daily.  3. Tribenzor 5/12.5/20 p.o. daily.  4. Lovastatin 20 mg daily at bedtime.  5. Allopurinol 100 mg daily.  6. Lantus 12 units daily.  7. Insulin aspart per your sliding scale.   DIET:  Carbohydrate-controlled diet.   FOLLOWUP:  1. With Dr. Gabriel Carina in 1 to 2 weeks.  2. Follow up with Dr. Venia Minks in 2 to 3 weeks.   LABORATORY DATA:  Glucose at discharge was 172, BUN is 18, creatinine is 1.79, sodium is 146, potassium is 4.8, chloride is 116, bicarbonate 21. Sugar on admission was in the thousands. Her hemoglobin A1c is 16.6. UA positive for mild UTI. Creatinine on admission was 2.84. H and H are 13.6 and 44.9.   CONSULTATIONS:  Endocrine consultation with Dr. Gabriel Carina.   BRIEF SUMMARY OF HOSPITAL COURSE:  Koreen Lizaola is a 74 year old African American female with a history of diabetes, comes in with poor appetite and uncontrolled sugars. 1. She was admitted with uncontrolled type 2 diabetes, blood sugar more than 1000, started on insulin gtt changed to Lantus and sliding scale. Her Lantus was increased to 12 units daily and added insulin aspart 7 units t.i.d. by Dr. Gabriel Carina; her hemoglobin A1c 16. She will follow up with Dr. Gabriel Carina as an outpatient.   2. Acute on chronic chronic kidney disease secondary to uncontrolled type 2 diabetes. Received IV fluids. Creatinine was  1.7 prior to discharge.   3. Hypothyroidism. Continue Synthroid.  4. Hyperkalemia secondary to acute renal failure.  5. Type 2 diabetes.  6. Leukocytosis, suspected from urinary tract infection . Received Cipro for 5 days. The patient did receive IV  antibiotics and was changed to p.o. Cipro.  7. Hyperlipidemia. Continue on her statin.  8. Hypertension. Her Tribenzor was resumed prior to discharge.  [facility] stay otherwise has been stable. The patient made a FULL CODE.   TIME SPENT:  40 minutes.   ____________________________ Hart Rochester Posey Pronto, MD sap:cs D: 05/09/2013 07:24:00 ET T: 05/09/2013 14:36:01 ET JOB#: [id number]  cc: Raedyn Wenke A. Posey Pronto, MD, <Dictator>  Ilda Basset MD ELECTRONICALLY SIGNED 05/16/2013 13:39

## 2014-07-09 NOTE — H&P (Signed)
PATIENT NAME:  Caitlyn Marshall, Caitlyn Marshall MR#:  [id number] DATE OF BIRTH:  1940/10/08  DATE OF ADMISSION:  05/04/2013  PRIMARY CARE PHYSICIAN: Jerrell Belfast, MD  PRIMARY ENDOCRINOLOGIST: Sherlon Handing, MD  CHIEF COMPLAINT: Loss of appetite, weakness.   HISTORY OF PRESENT ILLNESS:  This is a very pleasant 74 year old female with history of insulin-requiring diabetes, hypothyroidism, hyperlipidemia, hypertension who presents with the above complaint. Over the past week, the patient had a decreased appetite and weakness and she also says that she has been very thirsty. EMS was called. Upon arrival, her blood sugar was undetectable. Here it is over 1000. She was started on DKA protocol. She says she is compliant with her insulin but she has not taken her insulin over the last 24 hours due to her poor appetite.  REVIEW OF SYSTEMS: CONSTITUTIONAL: No fever. Positive fatigue, weakness. No weight loss, weight gain.  EYES: No blurred or double vision, glaucoma.  ENT:  No ear pain, hearing loss, snoring, postnasal drip.  RESPIRATORY:  No cough, wheezing, hemoptysis, COPD, pneumonia.  CARDIOVASCULAR:  No chest pain, palpitations, orthopnea, syncope, edema, arrhythmia,  GASTROINTESTINAL: Positive nausea, positive vomiting. No diarrhea, abdominal pain, melena or ulcers.  GENITOURINARY: No dysuria, hematuria, frequency or urgency.  ENDOCRINE:  No nocturia but positive hypothyroidism.  HEMATOLOGY AND LYMPHATIC:  No anemia or easy bruising.  SKIN: No rash or lesions.  MUSCULOSKELETAL: No limited activity.  NEUROLOGICAL:  No history of CVA, TIA or seizures.  PSYCHIATRIC: She says she stays depressed but denies any suicidal ideations or major depression.   PAST MEDICAL HISTORY:   1.  Hypertension.  2.  Hypothyroidism.  3.  Hyperlipidemia.  4.  Diabetes, insulin-requiring.   MEDICATIONS: 1.  Allopurinol 100 mg daily.  2.  Aspirin 81 mg daily.  3.  Lantus 7 units at bedtime.  4.  Synthroid 50 mcg daily.   5.  Lovastatin 20 mg daily.  6.  NovoLog 6 units t.i.d.  7.  Tribenzor 05/12.5/20 mg daily.   ALLERGIES: No known drug allergies.   PAST SURGICAL HISTORY: None.   FAMILY HISTORY:  Positive for dementia.   SOCIAL HISTORY:  No tobacco, alcohol or drug use.   PHYSICAL EXAM:  VITAL SIGNS:  The patient's temperature is 98.4, pulse 103, respirations 18, blood pressure 133/61, 97% on room air.  GENERAL: The patient is alert, oriented, does not appear to be in acute distress. She does look frail.  HEENT: Head is atraumatic. Pupils are round and reactive. Sclerae are anicteric. Mucous membranes are dry. It looks like she has some oral thrush. Oropharynx is clear.  NECK: Supple without JVD, carotid bruit or enlarged thyroid.  CARDIOVASCULAR: Tachycardia without murmur, gallops or rubs. PMI is not displaced. RESPIRATORY:  Lungs clear to auscultation without crackles, rales, rhonchi or wheezing. Normal percussion. Normal chest expansion.  BACK: No CVA or vertebral tenderness.  ABDOMEN:  Bowel sounds are present. Nontender, nondistended. No hepatosplenomegaly, no  guarding or rebound.  EXTREMITIES: No clubbing, cyanosis or edema.  NEUROLOGICAL:  Cranial nerves II through XII are grossly intact. There are no focal deficits.  MUSCULOSKELETAL: Has 5 out of 5 strength in all extremities.  SKIN:  Without any rash or lesions.   LABORATORY, DIAGNOSTIC AND RADIOLOGICAL DATA:  1.  Sodium 126, potassium 5.6, chloride 99, bicarb 14, BUN 62, creatinine 2.83, glucose 1054, anion gap is 13.  2.  Lipase 4980, beta-hydroxybutyrate 22, total protein 7.4, albumin 3.5, bilirubin 0.6, alk phos  192, AST 23, ALT 13.  3.  Troponin less than 0.02.  4.  White blood cells 14.2, hemoglobin 13.6, hematocrit 45, platelets 233.  5.  PH 7.18.  6.  EKG: Sinus tachycardia, no ST elevations or depressions.   ASSESSMENT AND PLAN: This is a 74 year old female who presents with diabetic ketoacidosis.  1.  Diabetic ketoacidosis.  Interesting the patient does not have an anion gap but is suspect this is from her sodium being low but her CO2 is low as well as her beta-hydroxybutyrate. The patient will be admitted to stepdown status. We started diabetic ketoacidosis protocol. We will continue to monitor BMP q.4 hours per protocol. We will continue per diabetic ketoacidosis protocol. We will also consult Dr. Gabriel Carina for her assistance. Check hemoglobin A1c.  2.  Acute renal failure secondary to diabetic ketoacidosis. We will hold any nephrotoxic agents, provide hydration for diabetic ketoacidosis. Suspect if her diabetic ketoacidosis resolves her creatinine will also improve.  I am holding allopurinol as well.  3.  Hypothyroidism. Continue Synthroid.  4.  Hyperkalemia secondary to acute renal failure. We will provide some Kayexalate, repeat the potassium after Kayexalate given.  5.  Leukocytosis. I suspect from severe dehydration in the setting of diabetic ketoacidosis. No source of infection. Urinalysis is pending at this time. Chest x-ray shows no acute cardiopulmonary disease.  6.  Tachycardia in the setting of dehydration. We will continue to monitor, provide IV fluids. The patient will be admitted to telemetry.  7.  Hyperlipidemia. We will continue her outpatient medications.  8.  Hypertension. The patient is on Tribenzor which can affect the kidneys so we will continue to monitor at this time and will add Norvasc for now 10 mg for her blood pressure.  9.  The patient is full CODE STATUS.   CRITICAL CARE TIME SPENT: Approximately 50 minutes.   ____________________________ Donell Beers. Benjie Karvonen, MD spm:cs D: 05/04/2013 15:36:00 ET T: 05/04/2013 15:49:45 ET JOB#: [id number]  cc:  P. Benjie Karvonen, MD, <Dictator> Jerrell Belfast, MD A. Lavone Orn, MD Donell Beers  MD ELECTRONICALLY SIGNED 05/04/2013 17:38

## 2014-07-20 DIAGNOSIS — I10 Essential (primary) hypertension: Secondary | ICD-10-CM | POA: Diagnosis not present

## 2014-07-20 DIAGNOSIS — E119 Type 2 diabetes mellitus without complications: Secondary | ICD-10-CM | POA: Diagnosis not present

## 2014-07-20 DIAGNOSIS — H52223 Regular astigmatism, bilateral: Secondary | ICD-10-CM | POA: Diagnosis not present

## 2014-07-20 DIAGNOSIS — H2513 Age-related nuclear cataract, bilateral: Secondary | ICD-10-CM | POA: Diagnosis not present

## 2014-08-04 DIAGNOSIS — E1059 Type 1 diabetes mellitus with other circulatory complications: Secondary | ICD-10-CM | POA: Diagnosis not present

## 2014-08-04 DIAGNOSIS — Z0000 Encounter for general adult medical examination without abnormal findings: Secondary | ICD-10-CM | POA: Diagnosis not present

## 2014-08-04 DIAGNOSIS — Z1389 Encounter for screening for other disorder: Secondary | ICD-10-CM | POA: Diagnosis not present

## 2014-08-04 DIAGNOSIS — E039 Hypothyroidism, unspecified: Secondary | ICD-10-CM | POA: Diagnosis not present

## 2014-08-05 DIAGNOSIS — E039 Hypothyroidism, unspecified: Secondary | ICD-10-CM | POA: Diagnosis not present

## 2014-08-05 DIAGNOSIS — I129 Hypertensive chronic kidney disease with stage 1 through stage 4 chronic kidney disease, or unspecified chronic kidney disease: Secondary | ICD-10-CM | POA: Diagnosis not present

## 2014-08-05 DIAGNOSIS — E780 Pure hypercholesterolemia: Secondary | ICD-10-CM | POA: Diagnosis not present

## 2014-08-05 LAB — LIPID PANEL
Cholesterol: 122 mg/dL (ref 0–200)
HDL: 52 mg/dL (ref 35–70)
LDL Cholesterol: 46 mg/dL
Triglycerides: 120 mg/dL (ref 40–160)

## 2014-08-05 LAB — BASIC METABOLIC PANEL
BUN: 34 mg/dL — AB (ref 4–21)
Creatinine: 1.9 mg/dL — AB (ref 0.5–1.1)
GLUCOSE: 184 mg/dL
POTASSIUM: 5 mmol/L (ref 3.4–5.3)
Sodium: 143 mmol/L (ref 137–147)

## 2014-08-05 LAB — HEPATIC FUNCTION PANEL
ALT: 11 U/L (ref 7–35)
AST: 18 U/L (ref 13–35)

## 2014-08-05 LAB — TSH: TSH: 1.92 u[IU]/mL (ref 0.41–5.90)

## 2014-09-05 ENCOUNTER — Other Ambulatory Visit: Payer: Self-pay | Admitting: Family Medicine

## 2014-09-05 DIAGNOSIS — E119 Type 2 diabetes mellitus without complications: Secondary | ICD-10-CM

## 2014-10-19 DIAGNOSIS — N183 Chronic kidney disease, stage 3 (moderate): Secondary | ICD-10-CM | POA: Diagnosis not present

## 2014-10-19 DIAGNOSIS — E1022 Type 1 diabetes mellitus with diabetic chronic kidney disease: Secondary | ICD-10-CM | POA: Diagnosis not present

## 2014-10-19 DIAGNOSIS — E039 Hypothyroidism, unspecified: Secondary | ICD-10-CM | POA: Diagnosis not present

## 2014-10-25 ENCOUNTER — Other Ambulatory Visit: Payer: Self-pay | Admitting: Family Medicine

## 2014-10-25 DIAGNOSIS — IMO0002 Reserved for concepts with insufficient information to code with codable children: Secondary | ICD-10-CM

## 2014-10-25 DIAGNOSIS — E1029 Type 1 diabetes mellitus with other diabetic kidney complication: Secondary | ICD-10-CM

## 2014-10-25 DIAGNOSIS — E1065 Type 1 diabetes mellitus with hyperglycemia: Principal | ICD-10-CM

## 2014-10-26 DIAGNOSIS — E039 Hypothyroidism, unspecified: Secondary | ICD-10-CM | POA: Diagnosis not present

## 2014-10-26 DIAGNOSIS — E109 Type 1 diabetes mellitus without complications: Secondary | ICD-10-CM | POA: Diagnosis not present

## 2014-10-26 NOTE — Telephone Encounter (Signed)
Last ov for DM was on 08/11/2013 pt last seen by endo on 06/09/2013. Last HgA1c 01/06/2013 8.7%, document in allscripts.

## 2014-10-26 NOTE — Telephone Encounter (Signed)
Sent rx, but please call patient. Needs OV.  Thanks.

## 2014-11-16 DIAGNOSIS — I129 Hypertensive chronic kidney disease with stage 1 through stage 4 chronic kidney disease, or unspecified chronic kidney disease: Secondary | ICD-10-CM | POA: Diagnosis not present

## 2014-11-16 DIAGNOSIS — E1129 Type 2 diabetes mellitus with other diabetic kidney complication: Secondary | ICD-10-CM | POA: Diagnosis not present

## 2014-11-16 DIAGNOSIS — N184 Chronic kidney disease, stage 4 (severe): Secondary | ICD-10-CM | POA: Diagnosis not present

## 2014-11-16 DIAGNOSIS — I10 Essential (primary) hypertension: Secondary | ICD-10-CM | POA: Diagnosis not present

## 2014-12-20 DIAGNOSIS — I129 Hypertensive chronic kidney disease with stage 1 through stage 4 chronic kidney disease, or unspecified chronic kidney disease: Secondary | ICD-10-CM | POA: Insufficient documentation

## 2014-12-20 DIAGNOSIS — I493 Ventricular premature depolarization: Secondary | ICD-10-CM

## 2014-12-20 DIAGNOSIS — E559 Vitamin D deficiency, unspecified: Secondary | ICD-10-CM

## 2014-12-20 DIAGNOSIS — M109 Gout, unspecified: Secondary | ICD-10-CM | POA: Insufficient documentation

## 2014-12-20 DIAGNOSIS — D649 Anemia, unspecified: Secondary | ICD-10-CM

## 2014-12-20 DIAGNOSIS — N183 Chronic kidney disease, stage 3 unspecified: Secondary | ICD-10-CM | POA: Insufficient documentation

## 2014-12-20 DIAGNOSIS — E039 Hypothyroidism, unspecified: Secondary | ICD-10-CM | POA: Insufficient documentation

## 2014-12-20 DIAGNOSIS — I739 Peripheral vascular disease, unspecified: Secondary | ICD-10-CM | POA: Insufficient documentation

## 2014-12-20 DIAGNOSIS — F419 Anxiety disorder, unspecified: Secondary | ICD-10-CM

## 2014-12-20 DIAGNOSIS — E7800 Pure hypercholesterolemia, unspecified: Secondary | ICD-10-CM

## 2014-12-20 DIAGNOSIS — N289 Disorder of kidney and ureter, unspecified: Secondary | ICD-10-CM

## 2014-12-20 DIAGNOSIS — M1990 Unspecified osteoarthritis, unspecified site: Secondary | ICD-10-CM | POA: Insufficient documentation

## 2014-12-21 ENCOUNTER — Encounter: Payer: Self-pay | Admitting: Family Medicine

## 2014-12-21 ENCOUNTER — Other Ambulatory Visit: Payer: Self-pay

## 2014-12-21 ENCOUNTER — Ambulatory Visit (INDEPENDENT_AMBULATORY_CARE_PROVIDER_SITE_OTHER): Payer: Commercial Managed Care - HMO | Admitting: Family Medicine

## 2014-12-21 VITALS — BP 160/46 | HR 60 | Temp 98.3°F | Resp 16 | Ht 64.5 in | Wt 117.0 lb

## 2014-12-21 DIAGNOSIS — N183 Chronic kidney disease, stage 3 unspecified: Secondary | ICD-10-CM

## 2014-12-21 DIAGNOSIS — I129 Hypertensive chronic kidney disease with stage 1 through stage 4 chronic kidney disease, or unspecified chronic kidney disease: Secondary | ICD-10-CM

## 2014-12-21 DIAGNOSIS — Z23 Encounter for immunization: Secondary | ICD-10-CM

## 2014-12-21 MED ORDER — AMLODIPINE BESYLATE 10 MG PO TABS: 10.0000 mg | ORAL_TABLET | Freq: Every day | ORAL | Status: DC

## 2014-12-21 NOTE — Progress Notes (Signed)
Subjective:    [name], female    DOB: 1941-02-05, 74 y.o.   MRN: [id number]  Hypertension This is a chronic problem. Associated symptoms include headaches and shortness of breath (on exertion). Pertinent negatives include no anxiety, blurred vision, chest pain, malaise/fatigue, neck pain, orthopnea, palpitations, peripheral edema or sweats. Treatments tried: Amlodipine 5 mg, Valsartan 160 mg, HCTZ 12.5 mg. Hypertensive end-organ damage includes kidney disease.  Pt's Nephrologist has recently changed pt's BP medication. Will update her chart.  Does not think her medication is working as well. Would like to add a medication. Concerned that HCTZ is for leg swelling.     Review of Systems  Constitutional: Negative for malaise/fatigue.  Eyes: Negative for blurred vision.  Respiratory: Positive for shortness of breath (on exertion).   Cardiovascular: Negative for chest pain, palpitations and orthopnea.  Musculoskeletal: Negative for neck pain.  Neurological: Positive for headaches.   BP 152/42 mmHg  Pulse 60  Temp(Src) 98.3 F (36.8 C) (Oral)  Resp 16  Ht 5' 4.5" (1.638 m)  Wt 117 lb (53.071 kg)  BMI 19.78 kg/m2   Patient Active Problem List   Diagnosis Date Noted  . Vitamin D deficiency 12/20/2014  . Anxiety 12/20/2014  . Hypercholesterolemia 12/20/2014  . Anemia 12/20/2014  . Hypothyroidism 12/20/2014  . Hypertensive CKD (chronic kidney disease) 12/20/2014  . Premature ventricular contraction 12/20/2014  . PVD (peripheral vascular disease) ([place]) 12/20/2014  . Gout 12/20/2014  . Osteoarthritis 12/20/2014  . Renal insufficiency 12/20/2014  . Chronic kidney disease (CKD), stage III (moderate) 12/20/2014  . Diabetes ([place]) 09/05/2014  . Type 1 diabetes mellitus without complication ([place]) 41/93/7902   No past medical history on file. Current Outpatient Prescriptions on File Prior to Visit  Medication Sig  . allopurinol (ZYLOPRIM) 100 MG tablet Take 100 mg  by mouth 2 (two) times daily.  . Insulin Syringe-Needle U-100 (INSULIN SYRINGE .5CC/30GX5/16") 30G X 5/16" 0.5 ML MISC U QID UTD  . levothyroxine (SYNTHROID, LEVOTHROID) 50 MCG tablet Take 1 tablet by mouth daily.  Marland Kitchen lovastatin (MEVACOR) 20 MG tablet Take 20 mg by mouth daily.  Marland Kitchen NOVOLOG 100 UNIT/ML injection INJECT 6 UNITS UNDER THE SKIN THREE TIMES DAILY  . ONE TOUCH ULTRA TEST test strip TEST THREE TIMES DAILY  . TRIBENZOR 20-5-12.5 MG TABS Take 1 tablet by mouth daily.   No current facility-administered medications on file prior to visit.   No Known Allergies Past Surgical History  Procedure Laterality Date  . Vascular stent in right leg    . Tubal ligation     Social History   Social History  . Marital Status: Widowed    Spouse Name: N/A  . Number of Children: N/A  . Years of Education: N/A   Occupational History  . Not on file.   Social History Main Topics  . Smoking status: Former Smoker    Quit date: 03/18/2007  . Smokeless tobacco: Never Used     Comment: Quit smoking in 2008  . Alcohol Use: No  . Drug Use: No  . Sexual Activity: Not on file   Other Topics Concern  . Not on file   Social History Narrative   Family History  Problem Relation Age of Onset  . Hypertension Mother   . Diabetes Mother     Diabetes mellitus Type 2  . Alzheimer's disease Mother       Objective:   Physical Exam  Constitutional: She is oriented to person, place, and time.  She appears well-developed and well-nourished.  Neurological: She is alert and oriented to person, place, and time.  Psychiatric: She has a normal mood and affect. Her behavior is normal. Judgment and thought content normal.  BP 152/42 mmHg  Pulse 60  Temp(Src) 98.3 F (36.8 C) (Oral)  Resp 16  Ht 5' 4.5" (1.638 m)  Wt 117 lb (53.071 kg)  BMI 19.78 kg/m2     Assessment & Plan:  1. Flu vaccine need Given today.  - Flu vaccine HIGH DOSE PF  2. 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 Will increase Norvasc and recheck in one to two weeks, just for BP check.   - amLODipine (NORVASC) 10 MG tablet; Take 1 tablet (10 mg total) by mouth daily.  Dispense: 30 tablet; Refill: 5  3. Chronic kidney disease (CKD), stage III (moderate) Follow up with nephrology in one month.  Margarita Rana, MD

## 2014-12-22 DIAGNOSIS — Z23 Encounter for immunization: Secondary | ICD-10-CM

## 2014-12-22 DIAGNOSIS — I129 Hypertensive chronic kidney disease with stage 1 through stage 4 chronic kidney disease, or unspecified chronic kidney disease: Secondary | ICD-10-CM | POA: Diagnosis not present

## 2014-12-22 DIAGNOSIS — N183 Chronic kidney disease, stage 3 (moderate): Secondary | ICD-10-CM | POA: Diagnosis not present

## 2014-12-25 ENCOUNTER — Other Ambulatory Visit: Payer: Self-pay | Admitting: Family Medicine

## 2014-12-25 DIAGNOSIS — J309 Allergic rhinitis, unspecified: Secondary | ICD-10-CM

## 2014-12-26 DIAGNOSIS — J309 Allergic rhinitis, unspecified: Secondary | ICD-10-CM | POA: Insufficient documentation

## 2015-02-28 ENCOUNTER — Other Ambulatory Visit: Payer: Self-pay | Admitting: Family Medicine

## 2015-03-22 DIAGNOSIS — E039 Hypothyroidism, unspecified: Secondary | ICD-10-CM | POA: Diagnosis not present

## 2015-03-22 DIAGNOSIS — E109 Type 1 diabetes mellitus without complications: Secondary | ICD-10-CM | POA: Diagnosis not present

## 2015-03-29 DIAGNOSIS — E109 Type 1 diabetes mellitus without complications: Secondary | ICD-10-CM | POA: Diagnosis not present

## 2015-03-29 DIAGNOSIS — E039 Hypothyroidism, unspecified: Secondary | ICD-10-CM | POA: Diagnosis not present

## 2015-05-22 ENCOUNTER — Other Ambulatory Visit: Payer: Self-pay | Admitting: Family Medicine

## 2015-05-22 DIAGNOSIS — E7800 Pure hypercholesterolemia, unspecified: Secondary | ICD-10-CM

## 2015-05-22 NOTE — Telephone Encounter (Signed)
LOV 12/21/2014. Last Lipids were checked May 2016 and were stable. Renaldo Fiddler, CMA.

## 2015-05-31 ENCOUNTER — Ambulatory Visit (INDEPENDENT_AMBULATORY_CARE_PROVIDER_SITE_OTHER): Payer: Commercial Managed Care - HMO | Admitting: Family Medicine

## 2015-05-31 ENCOUNTER — Encounter: Payer: Self-pay | Admitting: Family Medicine

## 2015-05-31 ENCOUNTER — Other Ambulatory Visit: Payer: Self-pay | Admitting: Family Medicine

## 2015-05-31 VITALS — BP 150/60 | HR 76 | Temp 98.3°F | Resp 16 | Wt 118.0 lb

## 2015-05-31 DIAGNOSIS — K5909 Other constipation: Secondary | ICD-10-CM

## 2015-05-31 DIAGNOSIS — J3089 Other allergic rhinitis: Secondary | ICD-10-CM | POA: Diagnosis not present

## 2015-05-31 DIAGNOSIS — G4700 Insomnia, unspecified: Secondary | ICD-10-CM

## 2015-05-31 DIAGNOSIS — N183 Chronic kidney disease, stage 3 unspecified: Secondary | ICD-10-CM

## 2015-05-31 DIAGNOSIS — R630 Anorexia: Secondary | ICD-10-CM

## 2015-05-31 DIAGNOSIS — M7989 Other specified soft tissue disorders: Secondary | ICD-10-CM | POA: Insufficient documentation

## 2015-05-31 DIAGNOSIS — M719 Bursopathy, unspecified: Secondary | ICD-10-CM | POA: Insufficient documentation

## 2015-05-31 DIAGNOSIS — F419 Anxiety disorder, unspecified: Secondary | ICD-10-CM

## 2015-05-31 DIAGNOSIS — E1029 Type 1 diabetes mellitus with other diabetic kidney complication: Secondary | ICD-10-CM | POA: Insufficient documentation

## 2015-05-31 MED ORDER — POLYETHYLENE GLYCOL 3350 17 GM/SCOOP PO POWD: 17.0000 g | Freq: Every day | ORAL | Status: DC

## 2015-05-31 MED ORDER — MIRTAZAPINE 15 MG PO TBDP
15.0000 mg | ORAL_TABLET | Freq: Every day | ORAL | Status: DC
Start: 2015-05-31 — End: 2015-05-31

## 2015-05-31 MED ORDER — LORATADINE 10 MG PO TABS: 10.0000 mg | ORAL_TABLET | Freq: Every day | ORAL | Status: DC

## 2015-05-31 NOTE — Progress Notes (Signed)
[name], female   DOB: 1941-02-24, 75 y.o.   MRN: [id number]       Patient: Caitlyn Marshall Female    DOB: [birth year]   75 y.o.   MRN: [id number] Visit Date: 05/31/2015  Today's Provider: Margarita Rana, MD   Chief Complaint  Patient presents with  . Allergic Rhinitis    Subjective:    HPI Upper Respiratory Infection: Patient complains of symptoms of a URI. Symptoms include cough and sneezing. Onset of symptoms was a few weeks ago, gradually improving since that time. She also c/o productive cough with  clear colored sputum and abdominal pain for the past 3 weeks .  She is drinking plenty of fluids. Evaluation to date: none. Treatment to date: none. Patient not sure which OTC medications she can take due to BP and diabetic medications.  Constipation: Patient complains of constipation.  Patient reports that constipation has been on and off for years. Patient reports that she drinks plenty of water through out the day. Patient reports that her diet is the about the same for years. Patient has used OTC laxatives on and off reports mild relief. Patient unsure if medications are causing constipation.     No Known Allergies Previous Medications   ALLOPURINOL (ZYLOPRIM) 100 MG TABLET    Take 100 mg by mouth 2 (two) times daily.   AMLODIPINE (NORVASC) 5 MG TABLET    TK 1 T PO QD   ASPIRIN EC 81 MG TABLET    Take by mouth.   HYDROCHLOROTHIAZIDE (HYDRODIURIL) 12.5 MG TABLET    TK 1 T PO QAM UTD FOR LEG SWELLING   INSULIN GLARGINE (LANTUS) 100 UNIT/ML INJECTION    INJECT 10 UNITS UNDER THE SKIN EVERY EVENING   INSULIN SYRINGE-NEEDLE U-100 (INSULIN SYRINGE .5CC/30GX5/16") 30G X 5/16" 0.5 ML MISC    U QID UTD   LEVOTHYROXINE (SYNTHROID, LEVOTHROID) 50 MCG TABLET    Take 1 tablet by mouth daily.   LOVASTATIN (MEVACOR) 20 MG TABLET    TAKE 1 TABLET BY MOUTH DAILY   MONTELUKAST (SINGULAIR) 10 MG TABLET    TAKE 1 TABLET BY MOUTH EVERY DAY   NOVOLOG 100 UNIT/ML INJECTION    INJECT  6 UNITS UNDER THE SKIN THREE TIMES DAILY   ONE TOUCH ULTRA TEST TEST STRIP    TEST THREE TIMES DAILY   ONETOUCH DELICA LANCETS 02V MISC    INJECT AS DIRECTED THREE TIMES DAILY   VALSARTAN (DIOVAN) 160 MG TABLET    TK 1 T PO QPM FOR HIGH BP    Review of Systems  Constitutional: Negative.   HENT: Positive for rhinorrhea and sneezing.   Respiratory: Positive for cough.   Gastrointestinal: Positive for abdominal pain and constipation.    Social History  Substance Use Topics  . Smoking status: Former Smoker    Quit date: 03/18/2007  . Smokeless tobacco: Never Used     Comment: Quit smoking in 2008  . Alcohol Use: No   Objective:   BP 150/60 mmHg  Pulse 76  Temp(Src) 98.3 F (36.8 C) (Oral)  Resp 16  Wt 118 lb (53.524 kg)  SpO2 98%  Physical Exam  Constitutional: She appears well-developed and well-nourished.  Cardiovascular: Normal rate, regular rhythm and normal heart sounds.   Pulmonary/Chest: Effort normal and breath sounds normal.  Abdominal: Soft. Bowel sounds are normal.  Psychiatric: She has a normal mood and affect. Her behavior is normal. Judgment and thought content normal.  Assessment & Plan:     1. Other allergic rhinitis New problem. Patient advised to start loratadine 10 mg as below. - loratadine (CLARITIN) 10 MG tablet; Take 1 tablet (10 mg total) by mouth daily.  Dispense: 30 tablet; Refill: 11  2. Other constipation Recurrent. Patient advised to start Miralax powder as below. - polyethylene glycol powder (GLYCOLAX/MIRALAX) powder; Take 17 g by mouth daily.  Dispense: 3350 g; Refill: 6  3. Insomnia Recurrent. Worsening. Patient started on mirtazapine 15 mg as below. Patient to follow-up in 6 weeks. - mirtazapine (REMERON SOL-TAB) 15 MG disintegrating tablet; Take 1 tablet (15 mg total) by mouth at bedtime. 1/2 tablet at bedtime 1 week then 1 tablet daily at bedtime.  Dispense: 30 tablet; Refill: 5  4. Poor appetite As above. - mirtazapine (REMERON  SOL-TAB) 15 MG disintegrating tablet; Take 1 tablet (15 mg total) by mouth at bedtime. 1/2 tablet at bedtime 1 week then 1 tablet daily at bedtime.  Dispense: 30 tablet; Refill: 5  5. Chronic kidney disease (CKD), stage III (moderate) Gave a sample of NovoLog #1 exp: 02/2016 lot # [id number] - Ambulatory referral to Nephrology      Patient seen and examined by Dr. Jerrell Belfast, and note scribed by Philbert Riser. Dimas, CMA. I have reviewed the document for accuracy and completeness and I agree with above. - Jerrell Belfast, MD    Margarita Rana, MD  [facility]

## 2015-06-07 DIAGNOSIS — N183 Chronic kidney disease, stage 3 (moderate): Secondary | ICD-10-CM | POA: Diagnosis not present

## 2015-06-07 DIAGNOSIS — E1122 Type 2 diabetes mellitus with diabetic chronic kidney disease: Secondary | ICD-10-CM | POA: Diagnosis not present

## 2015-06-07 DIAGNOSIS — I129 Hypertensive chronic kidney disease with stage 1 through stage 4 chronic kidney disease, or unspecified chronic kidney disease: Secondary | ICD-10-CM | POA: Diagnosis not present

## 2015-06-07 DIAGNOSIS — I10 Essential (primary) hypertension: Secondary | ICD-10-CM | POA: Diagnosis not present

## 2015-06-16 ENCOUNTER — Other Ambulatory Visit: Payer: Self-pay | Admitting: Family Medicine

## 2015-06-16 DIAGNOSIS — M1000 Idiopathic gout, unspecified site: Secondary | ICD-10-CM

## 2015-06-28 DIAGNOSIS — E1129 Type 2 diabetes mellitus with other diabetic kidney complication: Secondary | ICD-10-CM | POA: Diagnosis not present

## 2015-06-28 DIAGNOSIS — R809 Proteinuria, unspecified: Secondary | ICD-10-CM | POA: Diagnosis not present

## 2015-06-28 DIAGNOSIS — N184 Chronic kidney disease, stage 4 (severe): Secondary | ICD-10-CM | POA: Diagnosis not present

## 2015-06-28 DIAGNOSIS — I10 Essential (primary) hypertension: Secondary | ICD-10-CM | POA: Diagnosis not present

## 2015-07-12 ENCOUNTER — Ambulatory Visit (INDEPENDENT_AMBULATORY_CARE_PROVIDER_SITE_OTHER): Payer: Commercial Managed Care - HMO | Admitting: Family Medicine

## 2015-07-12 ENCOUNTER — Encounter: Payer: Self-pay | Admitting: Family Medicine

## 2015-07-12 VITALS — BP 148/64 | HR 80 | Temp 98.0°F | Resp 16 | Wt 122.0 lb

## 2015-07-12 DIAGNOSIS — K5900 Constipation, unspecified: Secondary | ICD-10-CM

## 2015-07-12 DIAGNOSIS — J309 Allergic rhinitis, unspecified: Secondary | ICD-10-CM | POA: Diagnosis not present

## 2015-07-12 DIAGNOSIS — R630 Anorexia: Secondary | ICD-10-CM

## 2015-07-12 DIAGNOSIS — G4700 Insomnia, unspecified: Secondary | ICD-10-CM

## 2015-07-12 DIAGNOSIS — I10 Essential (primary) hypertension: Secondary | ICD-10-CM | POA: Diagnosis not present

## 2015-07-12 NOTE — Progress Notes (Signed)
[name], female   DOB: 04-06-1940, 75 y.o.   MRN: [id number]        Patient: Caitlyn Marshall Female    DOB: 02-23-1941   75 y.o.   MRN: [id number] Visit Date: 07/12/2015  Today's Provider: Margarita Rana, MD   Chief Complaint  Patient presents with  . Insomnia   Subjective:    Insomnia Primary symptoms: fragmented sleep, no sleep disturbance, difficulty falling asleep.  The current episode started more than one year. The onset quality is gradual. The problem occurs every several days. The problem has been gradually improving since onset. How many beverages per day that contain caffeine: 0 - 1.  Types of beverages you drink: coffee. The symptoms are relieved by medication. Past treatments include medication. The treatment provided moderate relief. Typical bedtime:  10-11 P.M..  How long after going to bed to you fall asleep: 30-60 minutes.     Has gained weight since last visit. Eating better. Increased appetite since started Mirtazapine.     BP is improved, but not quite at goal. Had medication added by nephrology.  Has follow up in July.   Constipation is improved on Miralax.    No Known Allergies Previous Medications   ALLOPURINOL (ZYLOPRIM) 100 MG TABLET    TAKE 2 TABLETS BY MOUTH TWICE DAILY   AMLODIPINE (NORVASC) 5 MG TABLET    TK 1 T PO QD   ASPIRIN EC 81 MG TABLET    Take by mouth.   HYDROCHLOROTHIAZIDE (HYDRODIURIL) 12.5 MG TABLET    TK 1 T PO QAM UTD FOR LEG SWELLING   INSULIN GLARGINE (LANTUS) 100 UNIT/ML INJECTION    INJECT 10 UNITS UNDER THE SKIN EVERY EVENING   INSULIN SYRINGE-NEEDLE U-100 (INSULIN SYRINGE .5CC/30GX5/16") 30G X 5/16" 0.5 ML MISC    U QID UTD   LEVOTHYROXINE (SYNTHROID, LEVOTHROID) 50 MCG TABLET    Take 1 tablet by mouth daily.   LORATADINE (CLARITIN) 10 MG TABLET    Take 1 tablet (10 mg total) by mouth daily.   LOVASTATIN (MEVACOR) 20 MG TABLET    TAKE 1 TABLET BY MOUTH DAILY   MIRTAZAPINE (REMERON SOL-TAB) 15 MG DISINTEGRATING  TABLET    TAKE 1/2 TABLET BY MOUTH EVERY NIGHT AT BEDTIME FOR 1 WEEK THEN 1 TABLET EVERY NIGHT AT BEDTIME   MONTELUKAST (SINGULAIR) 10 MG TABLET    TAKE 1 TABLET BY MOUTH EVERY DAY   NOVOLOG 100 UNIT/ML INJECTION    INJECT 6 UNITS UNDER THE SKIN THREE TIMES DAILY   ONE TOUCH ULTRA TEST TEST STRIP    TEST THREE TIMES DAILY   ONETOUCH DELICA LANCETS 29B MISC    INJECT AS DIRECTED THREE TIMES DAILY   POLYETHYLENE GLYCOL POWDER (GLYCOLAX/MIRALAX) POWDER    Take 17 g by mouth daily.   VALSARTAN (DIOVAN) 160 MG TABLET    TK 1 T PO QPM FOR HIGH BP    Review of Systems  Constitutional: Negative for fever, chills, diaphoresis, activity change, appetite change (Appetitie has improved since starting Mirtazapine.), fatigue and unexpected weight change.  Respiratory: Negative.   Cardiovascular: Negative.   Gastrointestinal: Negative.   Musculoskeletal: Positive for arthralgias (Has some arthritis pain). Negative for myalgias, back pain, joint swelling, gait problem, neck pain and neck stiffness.  Neurological: Negative for dizziness, light-headedness and headaches.  Psychiatric/Behavioral: Negative for suicidal ideas, sleep disturbance, self-injury, dysphoric mood and decreased concentration. The patient has insomnia. The patient is not nervous/anxious.     Social History  Substance Use Topics  . Smoking status: Former Smoker    Quit date: 03/18/2007  . Smokeless tobacco: Never Used     Comment: Quit smoking in 2008  . Alcohol Use: No   Objective:   BP 148/64 mmHg  Pulse 80  Temp(Src) 98 F (36.7 C) (Oral)  Resp 16  Wt 122 lb (55.339 kg)  Physical Exam  Constitutional: She is oriented to person, place, and time. She appears well-developed and well-nourished.  Cardiovascular: Normal rate, regular rhythm and normal heart sounds.   Pulmonary/Chest: Effort normal and breath sounds normal.  Neurological: She is alert and oriented to person, place, and time.  Skin: Skin is warm and dry.    Psychiatric: She has a normal mood and affect. Her behavior is normal. Judgment and thought content normal.      Assessment & Plan:     1. Insomnia Pt reports improvement on Mirtazapine, she also reports her appetite has improved as well.  Continue current medications.    2. Essential hypertension Elevated today; cardiology recently increased her BP medication.  No changes for now; pt will follow up with cardiology next month.   3. Poor appetite Improved on Mirtazapine.  4. Allergic rhinitis, unspecified allergic rhinitis type Stable continue Loratadine.   5. Constipation, unspecified constipation type Improved with Miralax.  Pt says she only needs to take it every other day.   Patient was seen and examined by Jerrell Belfast, MD, and note scribed by Ashley Royalty, CMA.  I have reviewed the document for accuracy and completeness and I agree with above. - Jerrell Belfast, MD      Margarita Rana, MD  [facility]

## 2015-07-26 DIAGNOSIS — E109 Type 1 diabetes mellitus without complications: Secondary | ICD-10-CM | POA: Diagnosis not present

## 2015-08-02 DIAGNOSIS — E039 Hypothyroidism, unspecified: Secondary | ICD-10-CM | POA: Diagnosis not present

## 2015-08-02 DIAGNOSIS — E1065 Type 1 diabetes mellitus with hyperglycemia: Secondary | ICD-10-CM | POA: Diagnosis not present

## 2015-08-02 DIAGNOSIS — Z794 Long term (current) use of insulin: Secondary | ICD-10-CM | POA: Diagnosis not present

## 2015-09-18 ENCOUNTER — Other Ambulatory Visit: Payer: Self-pay | Admitting: Family Medicine

## 2015-09-18 DIAGNOSIS — E7800 Pure hypercholesterolemia, unspecified: Secondary | ICD-10-CM

## 2015-09-27 ENCOUNTER — Encounter: Payer: Self-pay | Admitting: *Deleted

## 2015-09-27 ENCOUNTER — Encounter: Payer: Commercial Managed Care - HMO | Attending: Internal Medicine | Admitting: *Deleted

## 2015-09-27 VITALS — BP 150/62 | Ht 66.0 in | Wt 120.9 lb

## 2015-09-27 DIAGNOSIS — E1022 Type 1 diabetes mellitus with diabetic chronic kidney disease: Secondary | ICD-10-CM

## 2015-09-27 DIAGNOSIS — N183 Chronic kidney disease, stage 3 unspecified: Secondary | ICD-10-CM

## 2015-09-27 DIAGNOSIS — E109 Type 1 diabetes mellitus without complications: Secondary | ICD-10-CM | POA: Diagnosis not present

## 2015-09-27 NOTE — Patient Instructions (Signed)
Check blood sugars at least 3 x day before each meal   Exercise: Continue walking for 15-20 minutes  3  days a week and gradually increase as tolerated  Eat 3 meals day,  2  snacks a day Space meals 4-6 hours apart Avoid sugar sweetened drinks (soda, juice) unless treating a low blood sugar Complete 3 Day Food Record and bring to next appt  Bring blood sugar records or glucometer to the next appointment  Carry fast acting glucose and a snack at all times  Rotate injection sites  Return for appointment on:  Wednesday October 25, 2015 at 1:30 pm with [facility] (dietitian)

## 2015-09-28 NOTE — Progress Notes (Signed)
Diabetes Self-Management Education  Visit Type: First/Initial  Appt. Start Time: 1525 Appt. End Time: 1640  09/27/2015  [name], identified by name and date of birth, is a 75 y.o. female with a diagnosis of Diabetes: Type 1.   ASSESSMENT  Blood pressure 150/62, height '5\' 6"'$  (1.676 m), weight 120 lb 14.4 oz (54.84 kg). Body mass index is 19.52 kg/(m^2).      Diabetes Self-Management Education - 09/27/15 1651    Visit Information   Visit Type First/Initial   Initial Visit   Diabetes Type Type 1   Are you currently following a meal plan? No   Are you taking your medications as prescribed? Yes   Date Diagnosed 1998   Psychosocial Assessment   Patient Belief/Attitude about Diabetes Other (comment)  "I would rather be normal"   Self-care barriers None   Self-management support Doctor's office;Family   Patient Concerns Nutrition/Meal planning   Special Needs None   Preferred Learning Style Visual   Learning Readiness Ready   How often do you need to have someone help you when you read instructions, pamphlets, or other written materials from your doctor or pharmacy? 1 - Never   What is the last grade level you completed in school? 12   Pre-Education Assessment   Patient understands the diabetes disease and treatment process. Needs Review   Patient understands incorporating nutritional management into lifestyle. Needs Instruction   Patient undertands incorporating physical activity into lifestyle. Needs Review   Patient understands using medications safely. Needs Review   Patient understands monitoring blood glucose, interpreting and using results Needs Review   Patient understands prevention, detection, and treatment of acute complications. Needs Review   Patient understands prevention, detection, and treatment of chronic complications. Needs Review   Patient understands how to develop strategies to address psychosocial issues. Needs Instruction   Patient understands  how to develop strategies to promote health/change behavior. Needs Instruction   Complications   Last HgB A1C per patient/outside source 9 %   How often do you check your blood sugar? 3-4 times/day   Fasting Blood glucose range (mg/dL) 70-129;130-179  Pt reports FBG's 110-150's mg/dL. She didn't bring meter or readings to this appointment.    Postprandial Blood glucose range (mg/dL) --  Pt reports pre-lunch readings 150-170's mg/dL and pre-supper 150's mg/dL. She reports no 200's mg/dL in last few weeks.   Number of hypoglycemic episodes per month 2   What do you do if your blood sugar is low? "eat something sweet"   Have you had a dilated eye exam in the past 12 months? Yes   Have you had a dental exam in the past 12 months? No  "no money"   Are you checking your feet? Yes   How many days per week are you checking your feet? 4   Dietary Intake   Breakfast egg, sausage sandwich and fruit   Lunch [place], ham or pimento cheese sandwich, chips   Dinner sandwich during the summer; meat and vegetables during the winter   Snack (evening) 1/2 sandwich   Beverage(s) water, coffee, lemonade with Splenda, 1 regular Pepsi or Sprite per day   Exercise   Exercise Type Light (walking / raking leaves)   How many days per week to you exercise? 3   How many minutes per day do you exercise? 15   Total minutes per week of exercise 45   Patient Education   Previous Diabetes Education No   Disease state  Factors that  contribute to the development of diabetes   Nutrition management  Role of diet in the treatment of diabetes and the relationship between the three main macronutrients and blood glucose level   Physical activity and exercise  Role of exercise on diabetes management, blood pressure control and cardiac health.   Medications Taught/reviewed insulin injection, site rotation, insulin storage and needle disposal.;Reviewed patients medication for diabetes, action, purpose, timing of dose and side  effects.   Monitoring Purpose and frequency of SMBG.;Identified appropriate SMBG and/or A1C goals.   Acute complications Taught treatment of hypoglycemia - the 15 rule.   Chronic complications Relationship between chronic complications and blood glucose control   Psychosocial adjustment Identified and addressed patients feelings and concerns about diabetes   Individualized Goals (developed by patient)   Reducing Risk Decrease medications   Outcomes   Expected Outcomes Demonstrated interest in learning. Expect positive outcomes   Future DMSE 4-6 wks      Individualized Plan for Diabetes Self-Management Training:   Learning Objective:  Patient will have a greater understanding of diabetes self-management. Patient education plan is to attend individual and/or group sessions per assessed needs and concerns.   Plan:   Patient Instructions  Check blood sugars at least 3 x day before each meal  Exercise: Continue walking for 15-20 minutes  3  days a week and gradually increase as tolerated Eat 3 meals day,  2  snacks a day Space meals 4-6 hours apart Avoid sugar sweetened drinks (soda, juice) unless treating a low blood sugar Complete 3 Day Food Record and bring to next appt Bring blood sugar records or glucometer to the next appointment Carry fast acting glucose and a snack at all times Rotate injection sites Return for appointment on:  Wednesday October 25, 2015 at 1:30 pm with [facility] (dietitian)   Expected Outcomes:  Demonstrated interest in learning. Expect positive outcomes  Education material provided:  General Meal Planning Guidelines Simple Meal Plan 3 Day Food Record Glucose tablets Symptoms, causes and treatments of Hypoglycemia  If problems or questions, patient to contact team via:  Johny Drilling, [place], [place], CDE [phone]  Future DSME appointment: 4-6 wks  October 25, 2015 with dietitian

## 2015-10-19 ENCOUNTER — Encounter: Payer: Self-pay | Admitting: Family Medicine

## 2015-10-25 ENCOUNTER — Encounter: Payer: Self-pay | Admitting: Dietician

## 2015-10-25 ENCOUNTER — Encounter: Payer: Commercial Managed Care - HMO | Attending: Internal Medicine | Admitting: Dietician

## 2015-10-25 VITALS — Ht 66.0 in | Wt 122.1 lb

## 2015-10-25 DIAGNOSIS — E109 Type 1 diabetes mellitus without complications: Secondary | ICD-10-CM | POA: Diagnosis not present

## 2015-10-25 DIAGNOSIS — E1022 Type 1 diabetes mellitus with diabetic chronic kidney disease: Secondary | ICD-10-CM

## 2015-10-25 DIAGNOSIS — N183 Chronic kidney disease, stage 3 (moderate): Secondary | ICD-10-CM

## 2015-10-25 NOTE — Patient Instructions (Signed)
   Eat every 3-4 hours while you are awake. Make sure to have a protein food and a carbohydrate food (starch, fruit) each time you eat.   Keep meals quick and easy to fix so you can keep your appetite up.   Do some light walking or other moving to help stimulate your hunger.   Eat small amounts often during the day.

## 2015-10-25 NOTE — Progress Notes (Signed)
Diabetes Self-Management Education  Visit Type:  Follow-up  Appt. Start Time: 1330 Appt. End Time: 1430  10/25/2015  [name], identified by name and date of birth, is a 75 y.o. female with a diagnosis of Diabetes:  .   ASSESSMENT  Height '5\' 6"'$  (1.676 m), weight 122 lb 1.6 oz (55.4 kg). Body mass index is 19.71 kg/m.       Diabetes Self-Management Education - 96/78/93 8101      Complications   How often do you check your blood sugar? 3-4 times/day   Fasting Blood glucose range (mg/dL) 70-129;130-179;180-200  93-192; one reading of 344   Postprandial Blood glucose range (mg/dL) --  tests before each meal daily; ranging 72-242 before lch, spr   Have you had a dilated eye exam in the past 12 months? Yes   Have you had a dental exam in the past 12 months? No   Are you checking your feet? Yes   How many days per week are you checking your feet? 4     Dietary Intake   Breakfast 3 meals and 1-2 snacks daily    Beverage(s) 2c coffee, water, occasional pepsi or sprite     Exercise   Exercise Type Light (walking / raking leaves)   How many days per week to you exercise? 3   How many minutes per day do you exercise? 15   Total minutes per week of exercise 45     Post-Education Assessment   Patient understands the diabetes disease and treatment process. Demonstrates understanding / competency   Patient understands incorporating nutritional management into lifestyle. Demonstrates understanding / competency   Patient undertands incorporating physical activity into lifestyle. Demonstrates understanding / competency   Patient understands using medications safely. Demonstrates understanding / competency   Patient understands monitoring blood glucose, interpreting and using results Demonstrates understanding / competency   Patient understands prevention, detection, and treatment of acute complications. Demonstrates understanding / competency   Patient understands prevention,  detection, and treatment of chronic complications. Demonstrates understanding / competency   Patient understands how to develop strategies to address psychosocial issues. Needs Review   Patient understands how to develop strategies to promote health/change behavior. Needs Review     Outcomes   Program Status Completed      Learning Objective:  Patient will have a greater understanding of diabetes self-management. Patient education plan is to attend individual and/or group sessions per assessed needs and concerns.  Discussed importance of adequate nutrition and kcal intake to maintain a healthy weight and strength. Patient reports her highest weight during her life has been 126lbs., and weight gain is very difficult for her. She also reports disinterest in eating most of the time.    Plan:   Patient Instructions   Eat every 3-4 hours while you are awake. Make sure to have a protein food and a carbohydrate food (starch, fruit) each time you eat.   Keep meals quick and easy to fix so you can keep your appetite up.   Do some light walking or other moving to help stimulate your hunger.   Eat small amounts often during the day.     Expected Outcomes:  Demonstrated interest in learning. Expect positive outcomes  Education material provided: plate method meal planner; Quick and Healthy Meal Ideas, Planning a Balanced Meal  If problems or questions, patient to contact team via:  Phone  Future DSME appointment: -  none scheduled, patient feels she does not need further follow-up

## 2015-11-01 DIAGNOSIS — E1122 Type 2 diabetes mellitus with diabetic chronic kidney disease: Secondary | ICD-10-CM | POA: Diagnosis not present

## 2015-11-01 DIAGNOSIS — E1065 Type 1 diabetes mellitus with hyperglycemia: Secondary | ICD-10-CM | POA: Diagnosis not present

## 2015-11-01 DIAGNOSIS — I10 Essential (primary) hypertension: Secondary | ICD-10-CM | POA: Diagnosis not present

## 2015-11-01 DIAGNOSIS — N183 Chronic kidney disease, stage 3 (moderate): Secondary | ICD-10-CM | POA: Diagnosis not present

## 2015-11-01 DIAGNOSIS — R809 Proteinuria, unspecified: Secondary | ICD-10-CM | POA: Diagnosis not present

## 2015-11-03 ENCOUNTER — Encounter: Payer: Self-pay | Admitting: Family Medicine

## 2015-11-08 DIAGNOSIS — E039 Hypothyroidism, unspecified: Secondary | ICD-10-CM | POA: Diagnosis not present

## 2015-11-08 DIAGNOSIS — E1022 Type 1 diabetes mellitus with diabetic chronic kidney disease: Secondary | ICD-10-CM | POA: Diagnosis not present

## 2015-11-08 DIAGNOSIS — E1065 Type 1 diabetes mellitus with hyperglycemia: Secondary | ICD-10-CM | POA: Diagnosis not present

## 2015-11-08 DIAGNOSIS — Z794 Long term (current) use of insulin: Secondary | ICD-10-CM | POA: Diagnosis not present

## 2015-11-08 DIAGNOSIS — N183 Chronic kidney disease, stage 3 (moderate): Secondary | ICD-10-CM | POA: Diagnosis not present

## 2016-01-01 ENCOUNTER — Other Ambulatory Visit: Payer: Self-pay | Admitting: Family Medicine

## 2016-01-01 DIAGNOSIS — J309 Allergic rhinitis, unspecified: Secondary | ICD-10-CM

## 2016-01-10 DIAGNOSIS — H5202 Hypermetropia, left eye: Secondary | ICD-10-CM | POA: Diagnosis not present

## 2016-01-10 DIAGNOSIS — H5211 Myopia, right eye: Secondary | ICD-10-CM | POA: Diagnosis not present

## 2016-01-10 DIAGNOSIS — E119 Type 2 diabetes mellitus without complications: Secondary | ICD-10-CM | POA: Diagnosis not present

## 2016-01-10 DIAGNOSIS — H2513 Age-related nuclear cataract, bilateral: Secondary | ICD-10-CM | POA: Diagnosis not present

## 2016-01-10 DIAGNOSIS — I10 Essential (primary) hypertension: Secondary | ICD-10-CM | POA: Diagnosis not present

## 2016-01-10 DIAGNOSIS — H52223 Regular astigmatism, bilateral: Secondary | ICD-10-CM | POA: Diagnosis not present

## 2016-01-10 LAB — HM DIABETES EYE EXAM

## 2016-01-11 ENCOUNTER — Encounter: Payer: Self-pay | Admitting: *Deleted

## 2016-01-30 ENCOUNTER — Other Ambulatory Visit: Payer: Self-pay | Admitting: Family Medicine

## 2016-02-01 ENCOUNTER — Ambulatory Visit: Payer: Commercial Managed Care - HMO

## 2016-02-02 ENCOUNTER — Ambulatory Visit (INDEPENDENT_AMBULATORY_CARE_PROVIDER_SITE_OTHER): Payer: Commercial Managed Care - HMO

## 2016-02-02 DIAGNOSIS — Z23 Encounter for immunization: Secondary | ICD-10-CM

## 2016-02-07 DIAGNOSIS — E039 Hypothyroidism, unspecified: Secondary | ICD-10-CM | POA: Diagnosis not present

## 2016-02-07 DIAGNOSIS — E1065 Type 1 diabetes mellitus with hyperglycemia: Secondary | ICD-10-CM | POA: Diagnosis not present

## 2016-02-14 DIAGNOSIS — E1065 Type 1 diabetes mellitus with hyperglycemia: Secondary | ICD-10-CM | POA: Diagnosis not present

## 2016-02-14 DIAGNOSIS — Z794 Long term (current) use of insulin: Secondary | ICD-10-CM | POA: Diagnosis not present

## 2016-02-14 DIAGNOSIS — E039 Hypothyroidism, unspecified: Secondary | ICD-10-CM | POA: Diagnosis not present

## 2016-02-16 ENCOUNTER — Encounter: Payer: Self-pay | Admitting: Dietician

## 2016-02-16 NOTE — Progress Notes (Signed)
Returned call from Brock Hall at Dr. Joycie Peek office. Gave clarification regarding patient's 10/25/15 appointment with dietitian at [facility]. Janett Billow stated that Dr. Gabriel Carina wants patient to be seen again with focus on carbohydrate counting.  Requested that Dr. Gabriel Carina send referral for medical nutrition therapy and we will be glad to call patient to set up the appointment.

## 2016-02-19 ENCOUNTER — Telehealth: Payer: Self-pay | Admitting: Dietician

## 2016-02-19 NOTE — Telephone Encounter (Signed)
Called Caitlyn Marshall to set up a follow-up appointment with the dietitian. She is scheduled for December 20th at 1:30pm at [facility], [facility].

## 2016-03-06 ENCOUNTER — Encounter: Payer: Self-pay | Admitting: Dietician

## 2016-03-06 DIAGNOSIS — E876 Hypokalemia: Secondary | ICD-10-CM | POA: Diagnosis not present

## 2016-03-06 DIAGNOSIS — N183 Chronic kidney disease, stage 3 (moderate): Secondary | ICD-10-CM | POA: Diagnosis not present

## 2016-03-06 DIAGNOSIS — E1122 Type 2 diabetes mellitus with diabetic chronic kidney disease: Secondary | ICD-10-CM | POA: Diagnosis not present

## 2016-03-06 DIAGNOSIS — I10 Essential (primary) hypertension: Secondary | ICD-10-CM | POA: Diagnosis not present

## 2016-03-06 DIAGNOSIS — I129 Hypertensive chronic kidney disease with stage 1 through stage 4 chronic kidney disease, or unspecified chronic kidney disease: Secondary | ICD-10-CM | POA: Diagnosis not present

## 2016-03-06 DIAGNOSIS — E875 Hyperkalemia: Secondary | ICD-10-CM | POA: Diagnosis not present

## 2016-03-06 NOTE — Progress Notes (Signed)
Discussed carbohydrate counting with Ms. Huyser per MD request. Ms. Edmundson still reports eating little, but she is eating 3-4 times a day typically. Verbal diet recall indicates moderate carbohydrate intake, but protein lacking in some meals.  Reviewed sources of carbohydrate and basic carbohydrate needs for each meal and snack. Advised 30-45g carbohydrate with meals and 15-30g with snacks. Used food labels and food list resources to demonstrate how to determine carb content of foods. Encouraged patient to have a nutrition drink high in protein on days she is not hungry for a meal, or unable to include a protein source with her meal. Encouraged her to call as needed with any questions or concerns.

## 2016-03-24 ENCOUNTER — Other Ambulatory Visit: Payer: Self-pay | Admitting: Family Medicine

## 2016-03-24 DIAGNOSIS — E7800 Pure hypercholesterolemia, unspecified: Secondary | ICD-10-CM

## 2016-05-22 DIAGNOSIS — E1065 Type 1 diabetes mellitus with hyperglycemia: Secondary | ICD-10-CM | POA: Diagnosis not present

## 2016-05-22 LAB — MICROALBUMIN, URINE: Microalb, Ur: 21

## 2016-05-29 DIAGNOSIS — E1065 Type 1 diabetes mellitus with hyperglycemia: Secondary | ICD-10-CM | POA: Diagnosis not present

## 2016-05-29 DIAGNOSIS — E039 Hypothyroidism, unspecified: Secondary | ICD-10-CM | POA: Diagnosis not present

## 2016-05-29 DIAGNOSIS — N183 Chronic kidney disease, stage 3 (moderate): Secondary | ICD-10-CM | POA: Diagnosis not present

## 2016-05-29 DIAGNOSIS — E1022 Type 1 diabetes mellitus with diabetic chronic kidney disease: Secondary | ICD-10-CM | POA: Diagnosis not present

## 2016-06-26 DIAGNOSIS — I10 Essential (primary) hypertension: Secondary | ICD-10-CM | POA: Diagnosis not present

## 2016-06-26 DIAGNOSIS — R0989 Other specified symptoms and signs involving the circulatory and respiratory systems: Secondary | ICD-10-CM | POA: Diagnosis not present

## 2016-06-26 DIAGNOSIS — N183 Chronic kidney disease, stage 3 (moderate): Secondary | ICD-10-CM | POA: Diagnosis not present

## 2016-06-26 DIAGNOSIS — E1129 Type 2 diabetes mellitus with other diabetic kidney complication: Secondary | ICD-10-CM | POA: Diagnosis not present

## 2016-07-03 ENCOUNTER — Encounter: Payer: Commercial Managed Care - HMO | Admitting: Family Medicine

## 2016-07-03 ENCOUNTER — Ambulatory Visit: Payer: Commercial Managed Care - HMO

## 2016-07-17 ENCOUNTER — Encounter: Payer: Self-pay | Admitting: Family Medicine

## 2016-07-19 ENCOUNTER — Telehealth: Payer: Self-pay | Admitting: Family Medicine

## 2016-07-19 NOTE — Telephone Encounter (Signed)
Called Pt to schedule AWV with [facility] - [facility]

## 2016-08-09 ENCOUNTER — Other Ambulatory Visit: Payer: Self-pay | Admitting: Family Medicine

## 2016-08-14 ENCOUNTER — Other Ambulatory Visit (INDEPENDENT_AMBULATORY_CARE_PROVIDER_SITE_OTHER): Payer: Self-pay | Admitting: Nephrology

## 2016-08-14 ENCOUNTER — Other Ambulatory Visit (INDEPENDENT_AMBULATORY_CARE_PROVIDER_SITE_OTHER): Payer: Medicare HMO

## 2016-08-14 DIAGNOSIS — R0989 Other specified symptoms and signs involving the circulatory and respiratory systems: Secondary | ICD-10-CM

## 2016-08-14 LAB — VAS US CAROTID
LCCADDIAS: 0 cm/s
LCCAPSYS: 113 cm/s
LEFT ECA DIAS: 0 cm/s
LEFT VERTEBRAL DIAS: 0 cm/s
LICADDIAS: -11 cm/s
LICAPSYS: -102 cm/s
Left CCA dist sys: -95 cm/s
Left CCA prox dias: 8 cm/s
Left ICA dist sys: -79 cm/s
Left ICA prox dias: -16 cm/s
RIGHT CCA MID DIAS: 0 cm/s
RIGHT ECA DIAS: 0 cm/s
RIGHT VERTEBRAL DIAS: 3 cm/s
Right CCA prox dias: 0 cm/s
Right CCA prox sys: 110 cm/s
Right cca dist sys: -105 cm/s

## 2016-09-04 DIAGNOSIS — E1065 Type 1 diabetes mellitus with hyperglycemia: Secondary | ICD-10-CM | POA: Diagnosis not present

## 2016-09-04 LAB — HEMOGLOBIN A1C: Hemoglobin A1C: 9.1

## 2016-09-11 DIAGNOSIS — E1022 Type 1 diabetes mellitus with diabetic chronic kidney disease: Secondary | ICD-10-CM | POA: Diagnosis not present

## 2016-09-11 DIAGNOSIS — E1065 Type 1 diabetes mellitus with hyperglycemia: Secondary | ICD-10-CM | POA: Diagnosis not present

## 2016-09-11 DIAGNOSIS — E039 Hypothyroidism, unspecified: Secondary | ICD-10-CM | POA: Diagnosis not present

## 2016-09-11 DIAGNOSIS — Z794 Long term (current) use of insulin: Secondary | ICD-10-CM | POA: Diagnosis not present

## 2016-09-11 DIAGNOSIS — N183 Chronic kidney disease, stage 3 (moderate): Secondary | ICD-10-CM | POA: Diagnosis not present

## 2016-10-02 ENCOUNTER — Encounter: Payer: Self-pay | Admitting: Family Medicine

## 2016-10-02 ENCOUNTER — Ambulatory Visit (INDEPENDENT_AMBULATORY_CARE_PROVIDER_SITE_OTHER): Payer: Medicare HMO

## 2016-10-02 ENCOUNTER — Ambulatory Visit (INDEPENDENT_AMBULATORY_CARE_PROVIDER_SITE_OTHER): Payer: Medicare HMO | Admitting: Family Medicine

## 2016-10-02 VITALS — BP 144/50

## 2016-10-02 VITALS — BP 148/52 | HR 80 | Temp 98.9°F | Ht 66.0 in | Wt 116.4 lb

## 2016-10-02 DIAGNOSIS — Z0000 Encounter for general adult medical examination without abnormal findings: Secondary | ICD-10-CM

## 2016-10-02 DIAGNOSIS — E1029 Type 1 diabetes mellitus with other diabetic kidney complication: Secondary | ICD-10-CM

## 2016-10-02 DIAGNOSIS — I10 Essential (primary) hypertension: Secondary | ICD-10-CM

## 2016-10-02 NOTE — Patient Instructions (Signed)
Caitlyn Marshall , Thank you for taking time to come for your Medicare Wellness Visit. I appreciate your ongoing commitment to your health goals. Please review the following plan we discussed and let me know if I can assist you in the future.   Screening recommendations/referrals: Colonoscopy: declined referral Mammogram: declined Bone Density: completed 04/13/12 Recommended yearly ophthalmology/optometry visit for glaucoma screening and checkup Recommended yearly dental visit for hygiene and checkup  Vaccinations: Influenza vaccine: due 11/2016 Pneumococcal vaccine: completed series Tdap vaccine: declined Shingles vaccine: declined  Advanced directives: Please bring a copy of your POA (Power of Attorney) and/or Living Will to your next appointment.   Conditions/risks identified: Recommend increasing water intake to 4-6 glasses a day.    Next appointment: None, need to schedule 1 year AWV.   Preventive Care 45 Years and Older, Female Preventive care refers to lifestyle choices and visits with your health care provider that can promote health and wellness. What does preventive care include?  A yearly physical exam. This is also called an annual well check.  Dental exams once or twice a year.  Routine eye exams. Ask your health care provider how often you should have your eyes checked.  Personal lifestyle choices, including:  Daily care of your teeth and gums.  Regular physical activity.  Eating a healthy diet.  Avoiding tobacco and drug use.  Limiting alcohol use.  Practicing safe sex.  Taking low-dose aspirin every day.  Taking vitamin and mineral supplements as recommended by your health care provider. What happens during an annual well check? The services and screenings done by your health care provider during your annual well check will depend on your age, overall health, lifestyle risk factors, and family history of disease. Counseling  Your health care provider  may ask you questions about your:  Alcohol use.  Tobacco use.  Drug use.  Emotional well-being.  Home and relationship well-being.  Sexual activity.  Eating habits.  History of falls.  Memory and ability to understand (cognition).  Work and work Statistician.  Reproductive health. Screening  You may have the following tests or measurements:  Height, weight, and BMI.  Blood pressure.  Lipid and cholesterol levels. These may be checked every 5 years, or more frequently if you are over 88 years old.  Skin check.  Lung cancer screening. You may have this screening every year starting at age 73 if you have a 30-pack-year history of smoking and currently smoke or have quit within the past 15 years.  Fecal occult blood test (FOBT) of the stool. You may have this test every year starting at age 45.  Flexible sigmoidoscopy or colonoscopy. You may have a sigmoidoscopy every 5 years or a colonoscopy every 10 years starting at age 53.  Hepatitis C blood test.  Hepatitis B blood test.  Sexually transmitted disease (STD) testing.  Diabetes screening. This is done by checking your blood sugar (glucose) after you have not eaten for a while (fasting). You may have this done every 1-3 years.  Bone density scan. This is done to screen for osteoporosis. You may have this done starting at age 64.  Mammogram. This may be done every 1-2 years. Talk to your health care provider about how often you should have regular mammograms. Talk with your health care provider about your test results, treatment options, and if necessary, the need for more tests. Vaccines  Your health care provider may recommend certain vaccines, such as:  Influenza vaccine. This is recommended every year.  Tetanus, diphtheria, and acellular pertussis (Tdap, Td) vaccine. You may need a Td booster every 10 years.  Zoster vaccine. You may need this after age 3.  Pneumococcal 13-valent conjugate (PCV13) vaccine.  One dose is recommended after age 82.  Pneumococcal polysaccharide (PPSV23) vaccine. One dose is recommended after age 60. Talk to your health care provider about which screenings and vaccines you need and how often you need them. This information is not intended to replace advice given to you by your health care provider. Make sure you discuss any questions you have with your health care provider. Document Released: 03/31/2015 Document Revised: 11/22/2015 Document Reviewed: 01/03/2015 Elsevier Interactive Patient Education  2017 [place] Prevention in the Home Falls can cause injuries. They can happen to people of all ages. There are many things you can do to make your home safe and to help prevent falls. What can I do on the outside of my home?  Regularly fix the edges of walkways and driveways and fix any cracks.  Remove anything that might make you trip as you walk through a door, such as a raised step or threshold.  Trim any bushes or trees on the path to your home.  Use bright outdoor lighting.  Clear any walking paths of anything that might make someone trip, such as rocks or tools.  Regularly check to see if handrails are loose or broken. Make sure that both sides of any steps have handrails.  Any raised decks and porches should have guardrails on the edges.  Have any leaves, snow, or ice cleared regularly.  Use sand or salt on walking paths during winter.  Clean up any spills in your garage right away. This includes oil or grease spills. What can I do in the bathroom?  Use night lights.  Install grab bars by the toilet and in the tub and shower. Do not use towel bars as grab bars.  Use non-skid mats or decals in the tub or shower.  If you need to sit down in the shower, use a plastic, non-slip stool.  Keep the floor dry. Clean up any water that spills on the floor as soon as it happens.  Remove soap buildup in the tub or shower regularly.  Attach bath  mats securely with double-sided non-slip rug tape.  Do not have throw rugs and other things on the floor that can make you trip. What can I do in the bedroom?  Use night lights.  Make sure that you have a light by your bed that is easy to reach.  Do not use any sheets or blankets that are too big for your bed. They should not hang down onto the floor.  Have a firm chair that has side arms. You can use this for support while you get dressed.  Do not have throw rugs and other things on the floor that can make you trip. What can I do in the kitchen?  Clean up any spills right away.  Avoid walking on wet floors.  Keep items that you use a lot in easy-to-reach places.  If you need to reach something above you, use a strong step stool that has a grab bar.  Keep electrical cords out of the way.  Do not use floor polish or wax that makes floors slippery. If you must use wax, use non-skid floor wax.  Do not have throw rugs and other things on the floor that can make you trip. What can I do  with my stairs?  Do not leave any items on the stairs.  Make sure that there are handrails on both sides of the stairs and use them. Fix handrails that are broken or loose. Make sure that handrails are as long as the stairways.  Check any carpeting to make sure that it is firmly attached to the stairs. Fix any carpet that is loose or worn.  Avoid having throw rugs at the top or bottom of the stairs. If you do have throw rugs, attach them to the floor with carpet tape.  Make sure that you have a light switch at the top of the stairs and the bottom of the stairs. If you do not have them, ask someone to add them for you. What else can I do to help prevent falls?  Wear shoes that:  Do not have high heels.  Have rubber bottoms.  Are comfortable and fit you well.  Are closed at the toe. Do not wear sandals.  If you use a stepladder:  Make sure that it is fully opened. Do not climb a closed  stepladder.  Make sure that both sides of the stepladder are locked into place.  Ask someone to hold it for you, if possible.  Clearly mark and make sure that you can see:  Any grab bars or handrails.  First and last steps.  Where the edge of each step is.  Use tools that help you move around (mobility aids) if they are needed. These include:  Canes.  Walkers.  Scooters.  Crutches.  Turn on the lights when you go into a dark area. Replace any light bulbs as soon as they burn out.  Set up your furniture so you have a clear path. Avoid moving your furniture around.  If any of your floors are uneven, fix them.  If there are any pets around you, be aware of where they are.  Review your medicines with your doctor. Some medicines can make you feel dizzy. This can increase your chance of falling. Ask your doctor what other things that you can do to help prevent falls. This information is not intended to replace advice given to you by your health care provider. Make sure you discuss any questions you have with your health care provider. Document Released: 12/29/2008 Document Revised: 08/10/2015 Document Reviewed: 04/08/2014 Elsevier Interactive Patient Education  2017 [employer].

## 2016-10-02 NOTE — Progress Notes (Signed)
Patient: Caitlyn Marshall, Female    DOB: 02-Sep-1940, 76 y.o.   MRN: [id number] Visit Date: 10/02/2016  Today's Provider: Lelon Huh, MD   Chief Complaint  Patient presents with  . Annual Exam  . Hypertension  . Chronic Kidney Disease  . Insomnia   Subjective:    Annual physical exam Caitlyn Marshall is a 76 y.o. female who presents today for health maintenance and complete physical. She feels well. She reports exercising daily. She reports she is sleeping poorly. She has AWV with nurse health advisor earlier today.   -----------------------------------------------------------------  Hypertension, follow-up:  BP Readings from Last 3 Encounters:  10/02/16 (!) 148/52  09/27/15 (!) 150/62  07/12/15 (!) 148/64    She was last seen for hypertension 1 years ago.  BP at that visit was 148/64. Management since that visit includes no changes. Patient was advised to follow up with Cardiology. She reports good compliance with treatment. She is not having side effects.  She is exercising. She is not adherent to low salt diet.   Outside blood pressures are not being checked. She is experiencing none.  Patient denies chest pain, chest pressure/discomfort, claudication, dyspnea, exertional chest pressure/discomfort, fatigue, irregular heart beat, lower extremity edema, near-syncope, orthopnea, palpitations, paroxysmal nocturnal dyspnea, syncope and tachypnea.   Cardiovascular risk factors include advanced age (older than 44 for men, 39 for women), diabetes mellitus and hypertension.  Use of agents associated with hypertension: NSAIDS.     Weight trend: decreasing steadily Wt Readings from Last 3 Encounters:  10/02/16 116 lb 6.4 oz (52.8 kg)  10/25/15 122 lb 1.6 oz (55.4 kg)  09/27/15 120 lb 14.4 oz (54.8 kg)    Current diet: has a poor appetite most days  ------------------------------------------------------------------------ Follow up of CKD:  Patient was last  seen for this problem over 1 year ago and was referred to Nephrology.Patient reports that her next follow up with Nephrology is in 1 month.  Follow up of Insomnia:  Patient was last seen for this problem over 1 year ago and no changes were made. Patient reports that she only gets 5-6 hours of sleep each night.   Review of Systems  Constitutional: Negative for chills, fatigue and fever.  HENT: Positive for sneezing. Negative for congestion, ear pain, rhinorrhea and sore throat.   Eyes: Positive for itching. Negative for pain and redness.  Respiratory: Negative for cough, shortness of breath and wheezing.   Cardiovascular: Negative for chest pain and leg swelling.  Gastrointestinal: Negative for abdominal pain, blood in stool, constipation, diarrhea and nausea.  Endocrine: Negative for polydipsia and polyphagia.  Genitourinary: Negative.  Negative for dysuria, flank pain, hematuria, pelvic pain, vaginal bleeding and vaginal discharge.  Musculoskeletal: Positive for neck stiffness. Negative for arthralgias, back pain, gait problem and joint swelling.  Skin: Negative for rash.  Neurological: Negative.  Negative for dizziness, tremors, seizures, weakness, light-headedness, numbness and headaches.  Hematological: Negative for adenopathy.  Psychiatric/Behavioral: Negative.  Negative for behavioral problems, confusion and dysphoric mood. The patient is not nervous/anxious and is not hyperactive.     Social History      She  reports that she quit smoking about 9 years ago. Her smoking use included Cigarettes. She has a 15.00 pack-year smoking history. She has never used smokeless tobacco. She reports that she does not drink alcohol or use drugs.       Social History   Social History  . Marital status: Widowed    Spouse  name: N/A  . Number of children: N/A  . Years of education: N/A   Social History Main Topics  . Smoking status: Former Smoker    Packs/day: 0.50    Years: 30.00    Types:  Cigarettes    Quit date: 03/18/2007  . Smokeless tobacco: Never Used     Comment: Quit smoking in 2008  . Alcohol use No  . Drug use: No  . Sexual activity: Not on file   Other Topics Concern  . Not on file   Social History Narrative  . No narrative on file    Past Medical History:  Diagnosis Date  . Chronic kidney disease   . Diabetes mellitus without complication ([place])   . Gout   . Hyperlipidemia   . Hypertension   . Thyroid disease      Patient Active Problem List   Diagnosis Date Noted  . Insomnia 07/12/2015  . Hypertension 07/12/2015  . Poor appetite 07/12/2015  . Constipation 07/12/2015  . Bursitis 05/31/2015  . Type 1 diabetes mellitus ([place]) 05/31/2015  . 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 Leg swelling 05/31/2015  . Allergic rhinitis 12/26/2014  . Vitamin D deficiency 12/20/2014  . Anxiety 12/20/2014  . Hypercholesterolemia 12/20/2014  . Anemia 12/20/2014  . Hypothyroidism 12/20/2014  . Hypertensive CKD (chronic kidney disease) 12/20/2014  . Premature ventricular contraction 12/20/2014  . PVD (peripheral vascular disease) ([place]) 12/20/2014  . Gout 12/20/2014  . Osteoarthritis 12/20/2014  . Renal insufficiency 12/20/2014  . Chronic kidney disease (CKD), stage III (moderate) 12/20/2014  . Diabetes ([place]) 09/05/2014  . Type 1 diabetes mellitus without complication ([place]) 52/77/8242    Past Surgical History:  Procedure Laterality Date  . TUBAL LIGATION    . Vascular Stent in right leg      Family History        Family Status  Relation Status  . Mother Deceased at age 101  . Father Deceased at age 38's       Diabetes mellitus Type II  . Sister Deceased       bone cancer  . Brother Deceased       seizure  . Brother Deceased       died from a motor vehicle accident, was hit by a car  . Son Deceased  . Son Alive        Her family history includes Alzheimer's disease in her mother; Cancer in her  son; Diabetes in her mother; Hypertension in her mother.     No Known Allergies   Current Outpatient Prescriptions:  .  allopurinol (ZYLOPRIM) 100 MG tablet, TAKE 2 TABLETS BY MOUTH TWICE DAILY, Disp: 360 tablet, Rfl: 0 .  amLODipine (NORVASC) 10 MG tablet, Take 10 mg by mouth daily., Disp: , Rfl:  .  aspirin EC 81 MG tablet, Take 81 mg by mouth daily. , Disp: , Rfl:  .  hydrochlorothiazide (HYDRODIURIL) 12.5 MG tablet, TK 1 T PO QAM UTD FOR LEG SWELLING, Disp: , Rfl: 11 .  insulin NPH Human (NOVOLIN N) 100 UNIT/ML injection, Inject into the skin 2 (two) times daily before a meal. , Disp: , Rfl:  .  insulin regular (NOVOLIN R,HUMULIN R) 100 units/mL injection, Inject into the skin 3 (three) times daily before meals. , Disp: , Rfl:  .  Insulin Syringe-Needle U-100 (INSULIN SYRINGE .5CC/30GX5/16") 30G X 5/16" 0.5 ML MISC, U QID UTD, Disp: , Rfl: 5 .  levothyroxine (SYNTHROID, LEVOTHROID) 50  MCG tablet, Take 1 tablet by mouth daily., Disp: , Rfl: 5 .  lovastatin (MEVACOR) 20 MG tablet, TAKE 1 TABLET BY MOUTH DAILY, Disp: 90 tablet, Rfl: 3 .  montelukast (SINGULAIR) 10 MG tablet, TAKE 1 TABLET BY MOUTH EVERY DAY, Disp: 90 tablet, Rfl: 3 .  ONE TOUCH ULTRA TEST test strip, TEST THREE TIMES DAILY, Disp: 100 each, Rfl: 5 .  ONETOUCH DELICA LANCETS 98P MISC, INJECT AS DIRECTED THREE TIMES DAILY, Disp: , Rfl:  .  polyethylene glycol powder (GLYCOLAX/MIRALAX) powder, Take 17 g by mouth daily. (Patient taking differently: Take 17 g by mouth daily as needed. ), Disp: 3350 g, Rfl: 6 .  valsartan (DIOVAN) 320 MG tablet, Take 320 mg by mouth daily. , Disp: , Rfl: 2   Patient Care Team: Birdie Sons, MD as PCP - General (Family Medicine) [name], OD as Consulting Physician (Optometry) [name], [name], MD as Physician Assistant (Endocrinology) [name], MD as Consulting Physician (Internal Medicine)      Objective:   Vitals:  Vital Signs - Last Recorded  Most recent update: 10/02/2016  2:28 PM by Fabio Neighbors, LPN  BP    382/50 (BP Location: Left Arm)     Pulse  80     Temp  98.9 F (37.2 C) (Oral)     Ht  5\' 6"  (1.676 m)     Wt  116 lb 6.4 oz (52.8 kg)      BMI  18.79 kg/m        Vitals:   10/02/16 1505  BP: (!) 144/50     Physical Exam   General Appearance:    Alert, cooperative, no distress, appears stated age  Head:    Normocephalic, without obvious abnormality, atraumatic  Eyes:    PERRL, conjunctiva/corneas clear, EOM's intact, fundi    benign, both eyes  Ears:    Normal TM's and external ear canals, both ears  Nose:   Nares normal, septum midline, mucosa normal, no drainage    or sinus tenderness  Throat:   Lips, mucosa, and tongue normal; teeth and gums normal  Neck:   Supple, symmetrical, trachea midline, no adenopathy;    thyroid:  no enlargement/tenderness/nodules; no carotid   bruit or JVD  Back:     Symmetric, no curvature, ROM normal, no CVA tenderness  Lungs:     Clear to auscultation bilaterally, respirations unlabored  Chest Wall:    No tenderness or deformity   Heart:    Regular rate and rhythm, S1 and S2 normal, no murmur, rub   or gallop  Breast Exam:    patient refused  Abdomen:     Soft, non-tender, bowel sounds active all four quadrants,    no masses, no organomegaly  Pelvic:    deferred  Extremities:   Extremities normal, atraumatic, no cyanosis or edema  Pulses:   2+ and symmetric all extremities  Skin:   Skin color, texture, turgor normal, no rashes or lesions  Lymph nodes:   Cervical, supraclavicular, and axillary nodes normal  Neurologic:   CNII-XII intact, normal strength, sensation and reflexes    throughout    Depression Screen PHQ 2/9 Scores 10/02/2016 10/02/2016 09/27/2015 07/12/2015  PHQ - 2 Score 0 0 0 0  PHQ- 9 Score 2 - - -      Assessment & Plan:     Routine Health Maintenance and Physical Exam  Exercise Activities and Dietary recommendations Goals    . Increase water intake  Recommend increasing water intake to 4-6 glasses a day.        Immunization History  Administered Date(s) Administered  . Influenza, High Dose Seasonal PF 12/22/2014, 02/02/2016  . Pneumococcal Conjugate-13 11/03/2013  . Pneumococcal Polysaccharide-23 04/15/2007, 08/07/2011    Health Maintenance  Topic Date Due  . MAMMOGRAM  09/15/2017 (Originally 03/12/1992)  . COLONOSCOPY  03/18/2026 (Originally 03/12/1992)  . TETANUS/TDAP  03/18/2026 (Originally 03/12/1961)  . INFLUENZA VACCINE  10/16/2016  . OPHTHALMOLOGY EXAM  01/09/2017  . HEMOGLOBIN A1C  03/06/2017  . FOOT EXAM  09/11/2017  . DEXA SCAN  Completed  . PNA vac Low Risk Adult  Completed     Discussed health benefits of physical activity, and encouraged her to engage in regular exercise appropriate for her age and condition.    --------------------------------------------------------------------  1. Annual physical exam Generally doing well. Patient given contact information to schedule mammogram. She refused referral for BMD and colonoscopy. Advised to call back to schedule BMD and order for cologuard when she decides to have these done.   2. Essential hypertension Stable Continue current medications.  Continue routine follow up Dr. Candiss Norse for CKD.  - EKG 12-Lead  3. 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 Gabriel Carina.  - Lipid panel   Lelon Huh, MD  [facility]

## 2016-10-02 NOTE — Patient Instructions (Addendum)
   You are due for a  screening colonoscopy. You can call our office at your convenience to schedule a referral to gastroenterologist or to order a Cologuard stool test   You are due for a bone density test to screen for osteoporosis. Please call our office at [phone] to schedule at your convenience.   Please call the [facility] [phone] to schedule a routine screening mammogram.

## 2016-10-02 NOTE — Progress Notes (Signed)
Subjective:   Caitlyn Marshall is a 76 y.o. female who presents for Medicare Annual (Subsequent) preventive examination.  Review of Systems:  N/A  Cardiac Risk Factors include: advanced age (>68men, >43 women);diabetes mellitus;dyslipidemia;hypertension     Objective:     Vitals: BP (!) 148/52 (BP Location: Left Arm)   Pulse 80   Temp 98.9 F (37.2 C) (Oral)   Ht 5\' 6"  (1.676 m)   Wt 116 lb 6.4 oz (52.8 kg)   BMI 18.79 kg/m   Body mass index is 18.79 kg/m.   Tobacco History  Smoking Status  . Former Smoker  . Packs/day: 0.50  . Years: 30.00  . Types: Cigarettes  . Quit date: 03/18/2007  Smokeless Tobacco  . Never Used    Comment: Quit smoking in 2008     Counseling given: Not Answered   Past Medical History:  Diagnosis Date  . Chronic kidney disease   . Diabetes mellitus without complication ([place])   . Gout   . Hyperlipidemia   . Hypertension   . Thyroid disease    Past Surgical History:  Procedure Laterality Date  . TUBAL LIGATION    . Vascular Stent in right leg     Family History  Problem Relation Age of Onset  . Hypertension Mother   . Diabetes Mother        Diabetes mellitus Type 2  . Alzheimer's disease Mother   . Cancer Son        died at age 77, esophagus   History  Sexual Activity  . Sexual activity: Not on file    Outpatient Encounter Prescriptions as of 10/02/2016  Medication Sig  . allopurinol (ZYLOPRIM) 100 MG tablet TAKE 2 TABLETS BY MOUTH TWICE DAILY  . amLODipine (NORVASC) 10 MG tablet Take 10 mg by mouth daily.  Marland Kitchen aspirin EC 81 MG tablet Take 81 mg by mouth daily.   . hydrochlorothiazide (HYDRODIURIL) 12.5 MG tablet TK 1 T PO QAM UTD FOR LEG SWELLING  . insulin NPH Human (NOVOLIN N) 100 UNIT/ML injection Inject into the skin 2 (two) times daily before a meal.   . insulin regular (NOVOLIN R,HUMULIN R) 100 units/mL injection Inject into the skin 3 (three) times daily before meals.   . Insulin Syringe-Needle U-100 (INSULIN  SYRINGE .5CC/30GX5/16") 30G X 5/16" 0.5 ML MISC U QID UTD  . levothyroxine (SYNTHROID, LEVOTHROID) 50 MCG tablet Take 1 tablet by mouth daily.  Marland Kitchen lovastatin (MEVACOR) 20 MG tablet TAKE 1 TABLET BY MOUTH DAILY  . montelukast (SINGULAIR) 10 MG tablet TAKE 1 TABLET BY MOUTH EVERY DAY  . ONE TOUCH ULTRA TEST test strip TEST THREE TIMES DAILY  . ONETOUCH DELICA LANCETS 44B MISC INJECT AS DIRECTED THREE TIMES DAILY  . polyethylene glycol powder (GLYCOLAX/MIRALAX) powder Take 17 g by mouth daily. (Patient taking differently: Take 17 g by mouth daily as needed. )  . valsartan (DIOVAN) 320 MG tablet Take 320 mg by mouth daily.   . [DISCONTINUED] insulin glargine (LANTUS) 100 UNIT/ML injection INJECT 10 UNITS UNDER THE SKIN EVERY EVENING  . [DISCONTINUED] mirtazapine (REMERON SOL-TAB) 15 MG disintegrating tablet TAKE 1/2 TABLET BY MOUTH EVERY NIGHT AT BEDTIME FOR 1 WEEK THEN 1 TABLET EVERY NIGHT AT BEDTIME  . [DISCONTINUED] NOVOLOG 100 UNIT/ML injection INJECT 6 UNITS UNDER THE SKIN THREE TIMES DAILY  . [DISCONTINUED] valsartan (DIOVAN) 160 MG tablet TK 1 T PO QPM FOR HIGH BP   No facility-administered encounter medications on file as of 10/02/2016.  Activities of Daily Living In your present state of health, do you have any difficulty performing the following activities: 10/02/2016  Hearing? N  Vision? N  Difficulty concentrating or making decisions? N  Walking or climbing stairs? N  Dressing or bathing? N  Doing errands, shopping? N  Preparing Food and eating ? N  Using the Toilet? N  In the past six months, have you accidently leaked urine? N  Do you have problems with loss of bowel control? N  Managing your Medications? N  Managing your Finances? N  Housekeeping or managing your Housekeeping? N  Some recent data might be hidden    Patient Care Team: Birdie Sons, MD as PCP - General (Family Medicine) [name], OD as Consulting Physician (Optometry) [name], [name], MD as  Physician Assistant (Endocrinology) [name], MD as Consulting Physician (Internal Medicine)    Assessment:     Exercise Activities and Dietary recommendations Current Exercise Habits: The patient does not participate in regular exercise at present, Exercise limited by: None identified  Goals    . Increase water intake          Recommend increasing water intake to 4-6 glasses a day.       Fall Risk Fall Risk  10/02/2016 10/25/2015 09/27/2015 07/12/2015  Falls in the past year? No No No No   Depression Screen PHQ 2/9 Scores 10/02/2016 10/02/2016 09/27/2015 07/12/2015  PHQ - 2 Score 0 0 0 0  PHQ- 9 Score 2 - - -     Cognitive Function     6CIT Screen 10/02/2016  What Year? 0 points  What month? 0 points  What time? 0 points  Count back from 20 0 points  Months in reverse 0 points  Repeat phrase 2 points  Total Score 2    Immunization History  Administered Date(s) Administered  . Influenza, High Dose Seasonal PF 12/22/2014, 02/02/2016  . Pneumococcal Conjugate-13 11/03/2013  . Pneumococcal Polysaccharide-23 04/15/2007, 08/07/2011   Screening Tests Health Maintenance  Topic Date Due  . MAMMOGRAM  09/15/2017 (Originally 03/12/1992)  . COLONOSCOPY  03/18/2026 (Originally 03/12/1992)  . TETANUS/TDAP  03/18/2026 (Originally 03/12/1961)  . INFLUENZA VACCINE  10/16/2016  . OPHTHALMOLOGY EXAM  01/09/2017  . HEMOGLOBIN A1C  03/06/2017  . FOOT EXAM  09/11/2017  . DEXA SCAN  Completed  . PNA vac Low Risk Adult  Completed      Plan:  I have personally reviewed and addressed the Medicare Annual Wellness questionnaire and have noted the following in the patient's chart:  A. Medical and social history B. Use of alcohol, tobacco or illicit drugs  C. Current medications and supplements D. Functional ability and status E.  Nutritional status F.  Physical activity G. Advance directives H. List of other physicians I.  Hospitalizations, surgeries, and ER visits in previous 12  months J.  [place] such as hearing and vision if needed, cognitive and depression L. Referrals and appointments - none  In addition, I have reviewed and discussed with patient certain preventive protocols, quality metrics, and best practice recommendations. A written personalized care plan for preventive services as well as general preventive health recommendations were provided to patient.  See attached scanned questionnaire for additional information.   Signed,  Fabio Neighbors, LPN Nurse Health Advisor   MD Recommendations: None. Pt declined mammogram, colonoscopy referral and tetanus vaccine today.

## 2016-10-03 DIAGNOSIS — E1029 Type 1 diabetes mellitus with other diabetic kidney complication: Secondary | ICD-10-CM | POA: Diagnosis not present

## 2016-10-04 LAB — LIPID PANEL
CHOLESTEROL TOTAL: 131 mg/dL (ref 100–199)
Chol/HDL Ratio: 2 ratio (ref 0.0–4.4)
HDL: 65 mg/dL (ref >39)
LDL Calculated: 49 mg/dL (ref 0–99)
TRIGLYCERIDES: 87 mg/dL (ref 0–149)
VLDL CHOLESTEROL CAL: 17 mg/dL (ref 5–40)

## 2016-11-06 DIAGNOSIS — H52223 Regular astigmatism, bilateral: Secondary | ICD-10-CM | POA: Diagnosis not present

## 2016-11-06 DIAGNOSIS — E119 Type 2 diabetes mellitus without complications: Secondary | ICD-10-CM | POA: Diagnosis not present

## 2016-11-06 DIAGNOSIS — H2513 Age-related nuclear cataract, bilateral: Secondary | ICD-10-CM | POA: Diagnosis not present

## 2016-11-06 DIAGNOSIS — I10 Essential (primary) hypertension: Secondary | ICD-10-CM | POA: Diagnosis not present

## 2016-11-06 DIAGNOSIS — H5213 Myopia, bilateral: Secondary | ICD-10-CM | POA: Diagnosis not present

## 2016-11-06 DIAGNOSIS — H524 Presbyopia: Secondary | ICD-10-CM | POA: Diagnosis not present

## 2016-11-28 ENCOUNTER — Other Ambulatory Visit: Payer: Self-pay | Admitting: Family Medicine

## 2016-11-28 NOTE — Telephone Encounter (Signed)
Pharmacy requesting refills. Thanks!  

## 2016-12-05 DIAGNOSIS — E1129 Type 2 diabetes mellitus with other diabetic kidney complication: Secondary | ICD-10-CM | POA: Diagnosis not present

## 2016-12-05 DIAGNOSIS — N183 Chronic kidney disease, stage 3 (moderate): Secondary | ICD-10-CM | POA: Diagnosis not present

## 2016-12-05 DIAGNOSIS — I10 Essential (primary) hypertension: Secondary | ICD-10-CM | POA: Diagnosis not present

## 2016-12-11 DIAGNOSIS — Z0100 Encounter for examination of eyes and vision without abnormal findings: Secondary | ICD-10-CM | POA: Diagnosis not present

## 2016-12-24 ENCOUNTER — Other Ambulatory Visit: Payer: Self-pay | Admitting: Family Medicine

## 2016-12-24 DIAGNOSIS — J309 Allergic rhinitis, unspecified: Secondary | ICD-10-CM

## 2016-12-25 DIAGNOSIS — E1065 Type 1 diabetes mellitus with hyperglycemia: Secondary | ICD-10-CM | POA: Diagnosis not present

## 2016-12-25 DIAGNOSIS — N183 Chronic kidney disease, stage 3 (moderate): Secondary | ICD-10-CM | POA: Diagnosis not present

## 2016-12-25 DIAGNOSIS — E039 Hypothyroidism, unspecified: Secondary | ICD-10-CM | POA: Diagnosis not present

## 2016-12-25 DIAGNOSIS — E1022 Type 1 diabetes mellitus with diabetic chronic kidney disease: Secondary | ICD-10-CM | POA: Diagnosis not present

## 2016-12-25 LAB — HEMOGLOBIN A1C: HEMOGLOBIN A1C: 8.1

## 2017-01-08 ENCOUNTER — Ambulatory Visit (INDEPENDENT_AMBULATORY_CARE_PROVIDER_SITE_OTHER): Payer: Medicare HMO

## 2017-01-08 DIAGNOSIS — Z23 Encounter for immunization: Secondary | ICD-10-CM | POA: Diagnosis not present

## 2017-03-12 ENCOUNTER — Other Ambulatory Visit: Payer: Self-pay | Admitting: Family Medicine

## 2017-03-12 DIAGNOSIS — E7800 Pure hypercholesterolemia, unspecified: Secondary | ICD-10-CM

## 2017-03-17 ENCOUNTER — Encounter: Payer: Self-pay | Admitting: Emergency Medicine

## 2017-03-17 ENCOUNTER — Emergency Department: Payer: Medicare HMO

## 2017-03-17 ENCOUNTER — Inpatient Hospital Stay
Admission: EM | Admit: 2017-03-17 | Discharge: 2017-03-19 | DRG: 871 | Disposition: A | Discharge status: Home / Self Care | Payer: Medicare HMO | Attending: Internal Medicine | Admitting: Internal Medicine

## 2017-03-17 ENCOUNTER — Other Ambulatory Visit: Payer: Self-pay

## 2017-03-17 DIAGNOSIS — N183 Chronic kidney disease, stage 3 (moderate): Secondary | ICD-10-CM | POA: Diagnosis not present

## 2017-03-17 DIAGNOSIS — Z79890 Hormone replacement therapy (postmenopausal): Secondary | ICD-10-CM

## 2017-03-17 DIAGNOSIS — E872 Acidosis: Secondary | ICD-10-CM | POA: Diagnosis not present

## 2017-03-17 DIAGNOSIS — R911 Solitary pulmonary nodule: Secondary | ICD-10-CM

## 2017-03-17 DIAGNOSIS — E785 Hyperlipidemia, unspecified: Secondary | ICD-10-CM | POA: Diagnosis not present

## 2017-03-17 DIAGNOSIS — Z87891 Personal history of nicotine dependence: Secondary | ICD-10-CM

## 2017-03-17 DIAGNOSIS — R109 Unspecified abdominal pain: Secondary | ICD-10-CM

## 2017-03-17 DIAGNOSIS — N179 Acute kidney failure, unspecified: Secondary | ICD-10-CM | POA: Diagnosis not present

## 2017-03-17 DIAGNOSIS — E1122 Type 2 diabetes mellitus with diabetic chronic kidney disease: Secondary | ICD-10-CM | POA: Diagnosis present

## 2017-03-17 DIAGNOSIS — M109 Gout, unspecified: Secondary | ICD-10-CM | POA: Diagnosis present

## 2017-03-17 DIAGNOSIS — J189 Pneumonia, unspecified organism: Secondary | ICD-10-CM

## 2017-03-17 DIAGNOSIS — E1165 Type 2 diabetes mellitus with hyperglycemia: Secondary | ICD-10-CM | POA: Diagnosis present

## 2017-03-17 DIAGNOSIS — I129 Hypertensive chronic kidney disease with stage 1 through stage 4 chronic kidney disease, or unspecified chronic kidney disease: Secondary | ICD-10-CM | POA: Diagnosis not present

## 2017-03-17 DIAGNOSIS — Z833 Family history of diabetes mellitus: Secondary | ICD-10-CM | POA: Diagnosis not present

## 2017-03-17 DIAGNOSIS — Z8249 Family history of ischemic heart disease and other diseases of the circulatory system: Secondary | ICD-10-CM

## 2017-03-17 DIAGNOSIS — N189 Chronic kidney disease, unspecified: Secondary | ICD-10-CM | POA: Diagnosis not present

## 2017-03-17 DIAGNOSIS — Z7982 Long term (current) use of aspirin: Secondary | ICD-10-CM

## 2017-03-17 DIAGNOSIS — A419 Sepsis, unspecified organism: Secondary | ICD-10-CM | POA: Diagnosis not present

## 2017-03-17 DIAGNOSIS — E876 Hypokalemia: Secondary | ICD-10-CM | POA: Diagnosis present

## 2017-03-17 DIAGNOSIS — Z79899 Other long term (current) drug therapy: Secondary | ICD-10-CM | POA: Diagnosis not present

## 2017-03-17 DIAGNOSIS — E039 Hypothyroidism, unspecified: Secondary | ICD-10-CM | POA: Diagnosis present

## 2017-03-17 DIAGNOSIS — E860 Dehydration: Secondary | ICD-10-CM | POA: Diagnosis present

## 2017-03-17 DIAGNOSIS — R1010 Upper abdominal pain, unspecified: Secondary | ICD-10-CM | POA: Diagnosis not present

## 2017-03-17 DIAGNOSIS — R1110 Vomiting, unspecified: Secondary | ICD-10-CM | POA: Diagnosis not present

## 2017-03-17 DIAGNOSIS — Z794 Long term (current) use of insulin: Secondary | ICD-10-CM | POA: Diagnosis not present

## 2017-03-17 DIAGNOSIS — J181 Lobar pneumonia, unspecified organism: Secondary | ICD-10-CM | POA: Diagnosis present

## 2017-03-17 DIAGNOSIS — D72829 Elevated white blood cell count, unspecified: Secondary | ICD-10-CM | POA: Diagnosis not present

## 2017-03-17 DIAGNOSIS — R1011 Right upper quadrant pain: Secondary | ICD-10-CM | POA: Diagnosis not present

## 2017-03-17 DIAGNOSIS — R918 Other nonspecific abnormal finding of lung field: Secondary | ICD-10-CM | POA: Diagnosis not present

## 2017-03-17 LAB — URINALYSIS, COMPLETE (UACMP) WITH MICROSCOPIC
BILIRUBIN URINE: NEGATIVE
Bacteria, UA: NONE SEEN
GLUCOSE, UA: 50 mg/dL — AB
Ketones, ur: NEGATIVE mg/dL
Leukocytes, UA: NEGATIVE
NITRITE: NEGATIVE
Protein, ur: NEGATIVE mg/dL
RBC / HPF: NONE SEEN RBC/hpf (ref 0–5)
SPECIFIC GRAVITY, URINE: 1.008 (ref 1.005–1.030)
Squamous Epithelial / LPF: NONE SEEN
pH: 5 (ref 5.0–8.0)

## 2017-03-17 LAB — BLOOD GAS, VENOUS
Acid-base deficit: 4.7 mmol/L — ABNORMAL HIGH (ref 0.0–2.0)
BICARBONATE: 19.4 mmol/L — AB (ref 20.0–28.0)
O2 SAT: 80.2 %
PATIENT TEMPERATURE: 37
pCO2, Ven: 32 mmHg — ABNORMAL LOW (ref 44.0–60.0)
pH, Ven: 7.39 (ref 7.250–7.430)
pO2, Ven: 45 mmHg (ref 32.0–45.0)

## 2017-03-17 LAB — CBC WITH DIFFERENTIAL/PLATELET
BASOS PCT: 0 %
Basophils Absolute: 0.1 10*3/uL (ref 0–0.1)
EOS ABS: 0 10*3/uL (ref 0–0.7)
Eosinophils Relative: 0 %
HCT: 37 % (ref 35.0–47.0)
HEMOGLOBIN: 11.7 g/dL — AB (ref 12.0–16.0)
Lymphocytes Relative: 2 %
Lymphs Abs: 0.7 10*3/uL — ABNORMAL LOW (ref 1.0–3.6)
MCH: 27.2 pg (ref 26.0–34.0)
MCHC: 31.5 g/dL — AB (ref 32.0–36.0)
MCV: 86.4 fL (ref 80.0–100.0)
Monocytes Absolute: 1.1 10*3/uL — ABNORMAL HIGH (ref 0.2–0.9)
Monocytes Relative: 3 %
NEUTROS PCT: 95 %
Neutro Abs: 29.7 10*3/uL — ABNORMAL HIGH (ref 1.4–6.5)
PLATELETS: 198 10*3/uL (ref 150–440)
RBC: 4.28 MIL/uL (ref 3.80–5.20)
RDW: 17 % — ABNORMAL HIGH (ref 11.5–14.5)
WBC: 31.7 10*3/uL — AB (ref 3.6–11.0)

## 2017-03-17 LAB — BETA-HYDROXYBUTYRIC ACID: Beta-Hydroxybutyric Acid: 0.08 mmol/L (ref 0.05–0.27)

## 2017-03-17 LAB — COMPREHENSIVE METABOLIC PANEL
ALK PHOS: 103 U/L (ref 38–126)
ALT: 12 U/L — AB (ref 14–54)
AST: 29 U/L (ref 15–41)
Albumin: 3.9 g/dL (ref 3.5–5.0)
Anion gap: 11 (ref 5–15)
BILIRUBIN TOTAL: 1.1 mg/dL (ref 0.3–1.2)
BUN: 23 mg/dL — ABNORMAL HIGH (ref 6–20)
CALCIUM: 9 mg/dL (ref 8.9–10.3)
CO2: 18 mmol/L — ABNORMAL LOW (ref 22–32)
CREATININE: 1.95 mg/dL — AB (ref 0.44–1.00)
Chloride: 109 mmol/L (ref 101–111)
GFR, EST AFRICAN AMERICAN: 28 mL/min — AB (ref >60)
GFR, EST NON AFRICAN AMERICAN: 24 mL/min — AB (ref >60)
Glucose, Bld: 370 mg/dL — ABNORMAL HIGH (ref 65–99)
Potassium: 2.9 mmol/L — ABNORMAL LOW (ref 3.5–5.1)
Sodium: 138 mmol/L (ref 135–145)
Total Protein: 7.1 g/dL (ref 6.5–8.1)

## 2017-03-17 LAB — GLUCOSE, CAPILLARY
GLUCOSE-CAPILLARY: 342 mg/dL — AB (ref 65–99)
GLUCOSE-CAPILLARY: 138 mg/dL — AB (ref 65–99)
Glucose-Capillary: 175 mg/dL — ABNORMAL HIGH (ref 65–99)

## 2017-03-17 LAB — LACTIC ACID, PLASMA
LACTIC ACID, VENOUS: 2.6 mmol/L — AB (ref 0.5–1.9)
LACTIC ACID, VENOUS: 2.3 mmol/L — AB (ref 0.5–1.9)

## 2017-03-17 LAB — LIPASE, BLOOD: Lipase: 16 U/L (ref 11–51)

## 2017-03-17 LAB — MAGNESIUM: MAGNESIUM: 1.4 mg/dL — AB (ref 1.7–2.4)

## 2017-03-17 LAB — POTASSIUM: Potassium: 3.7 mmol/L (ref 3.5–5.1)

## 2017-03-17 MED ORDER — PIPERACILLIN-TAZOBACTAM 3.375 G IVPB: 3.3750 g | Freq: Three times a day (TID) | INTRAVENOUS | Status: DC

## 2017-03-17 MED ORDER — INSULIN DETEMIR 100 UNIT/ML ~~LOC~~ SOLN
8.0000 [IU] | Freq: Two times a day (BID) | SUBCUTANEOUS | Status: DC
  Administered 2017-03-17 – 2017-03-19 (×4): 8 [IU] via SUBCUTANEOUS
  Filled 2017-03-17 (×5): qty 0.08

## 2017-03-17 MED ORDER — AZITHROMYCIN 500 MG PO TABS: 250.0000 mg | ORAL_TABLET | Freq: Every day | ORAL | Status: DC

## 2017-03-17 MED ORDER — INSULIN ASPART 100 UNIT/ML ~~LOC~~ SOLN
0.0000 [IU] | Freq: Three times a day (TID) | SUBCUTANEOUS | Status: DC
Start: 2017-03-17 — End: 2017-03-19
  Administered 2017-03-17: 17:00:00 2 [IU] via SUBCUTANEOUS
  Administered 2017-03-18: 1 [IU] via SUBCUTANEOUS
  Administered 2017-03-18: 5 [IU] via SUBCUTANEOUS
  Administered 2017-03-18: 7 [IU] via SUBCUTANEOUS
  Administered 2017-03-19: 5 [IU] via SUBCUTANEOUS
  Filled 2017-03-17 (×5): qty 1

## 2017-03-17 MED ORDER — ALBUTEROL SULFATE (2.5 MG/3ML) 0.083% IN NEBU
2.5000 mg | INHALATION_SOLUTION | Freq: Four times a day (QID) | RESPIRATORY_TRACT | Status: DC
  Administered 2017-03-17: 20:00:00 2.5 mg via RESPIRATORY_TRACT
  Filled 2017-03-17: qty 3

## 2017-03-17 MED ORDER — ACETAMINOPHEN 325 MG PO TABS
650.0000 mg | ORAL_TABLET | Freq: Four times a day (QID) | ORAL | Status: DC | PRN
  Administered 2017-03-19: 01:00:00 650 mg via ORAL
  Filled 2017-03-17: qty 2

## 2017-03-17 MED ORDER — CEFTRIAXONE SODIUM IN DEXTROSE 20 MG/ML IV SOLN
1.0000 g | Freq: Once | INTRAVENOUS | Status: AC
  Administered 2017-03-17: 1 g via INTRAVENOUS
  Filled 2017-03-17: qty 50

## 2017-03-17 MED ORDER — ALBUTEROL SULFATE (2.5 MG/3ML) 0.083% IN NEBU
2.5000 mg | INHALATION_SOLUTION | Freq: Three times a day (TID) | RESPIRATORY_TRACT | Status: DC
  Administered 2017-03-18 – 2017-03-19 (×4): 2.5 mg via RESPIRATORY_TRACT
  Filled 2017-03-17 (×4): qty 3

## 2017-03-17 MED ORDER — SODIUM CHLORIDE 0.9 % IV SOLN
INTRAVENOUS | Status: DC
  Administered 2017-03-17 – 2017-03-18 (×4): via INTRAVENOUS

## 2017-03-17 MED ORDER — ASPIRIN EC 81 MG PO TBEC
81.0000 mg | DELAYED_RELEASE_TABLET | Freq: Every day | ORAL | Status: DC
  Administered 2017-03-17 – 2017-03-19 (×3): 81 mg via ORAL
  Filled 2017-03-17 (×3): qty 1

## 2017-03-17 MED ORDER — POTASSIUM CHLORIDE 10 MEQ/100ML IV SOLN
10.0000 meq | INTRAVENOUS | Status: AC
  Administered 2017-03-17 (×4): 10 meq via INTRAVENOUS
  Filled 2017-03-17 (×4): qty 100

## 2017-03-17 MED ORDER — ACETAMINOPHEN 325 MG PO TABS
650.0000 mg | ORAL_TABLET | Freq: Once | ORAL | Status: AC
  Administered 2017-03-17: 650 mg via ORAL
  Filled 2017-03-17: qty 2

## 2017-03-17 MED ORDER — PRAVASTATIN SODIUM 10 MG PO TABS
10.0000 mg | ORAL_TABLET | Freq: Every day | ORAL | Status: DC
  Administered 2017-03-17 – 2017-03-18 (×2): 10 mg via ORAL
  Filled 2017-03-17 (×3): qty 1

## 2017-03-17 MED ORDER — MAGNESIUM SULFATE 2 GM/50ML IV SOLN
2.0000 g | Freq: Once | INTRAVENOUS | Status: AC
  Administered 2017-03-17: 2 g via INTRAVENOUS
  Filled 2017-03-17: qty 50

## 2017-03-17 MED ORDER — ONDANSETRON HCL 4 MG PO TABS: 4.0000 mg | ORAL_TABLET | Freq: Four times a day (QID) | ORAL | Status: DC | PRN

## 2017-03-17 MED ORDER — DEXTROSE 5 % IV SOLN
INTRAVENOUS | Status: DC
  Administered 2017-03-18: 15:00:00 via INTRAVENOUS
  Filled 2017-03-17 (×2): qty 10

## 2017-03-17 MED ORDER — DEXTROSE 5 % IV SOLN
500.0000 mg | Freq: Once | INTRAVENOUS | Status: AC
  Administered 2017-03-17: 500 mg via INTRAVENOUS
  Filled 2017-03-17: qty 500

## 2017-03-17 MED ORDER — DEXTROSE 5 % IV SOLN: INTRAVENOUS | Status: DC

## 2017-03-17 MED ORDER — SODIUM CHLORIDE 0.9 % IV BOLUS (SEPSIS)
500.0000 mL | Freq: Once | INTRAVENOUS | Status: AC
  Administered 2017-03-17: 500 mL via INTRAVENOUS

## 2017-03-17 MED ORDER — ONDANSETRON HCL 4 MG/2ML IJ SOLN: 4.0000 mg | Freq: Four times a day (QID) | INTRAMUSCULAR | Status: DC | PRN

## 2017-03-17 MED ORDER — POLYETHYLENE GLYCOL 3350 17 G PO PACK: 17.0000 g | PACK | Freq: Every day | ORAL | Status: DC | PRN

## 2017-03-17 MED ORDER — LEVOTHYROXINE SODIUM 50 MCG PO TABS
50.0000 ug | ORAL_TABLET | Freq: Every day | ORAL | Status: DC
  Administered 2017-03-17 – 2017-03-19 (×3): 50 ug via ORAL
  Filled 2017-03-17 (×3): qty 1

## 2017-03-17 MED ORDER — DEXTROSE 5 % IV SOLN
500.0000 mg | INTRAVENOUS | Status: DC
  Administered 2017-03-18: 14:00:00 500 mg via INTRAVENOUS
  Filled 2017-03-17 (×2): qty 500

## 2017-03-17 MED ORDER — MONTELUKAST SODIUM 10 MG PO TABS
10.0000 mg | ORAL_TABLET | Freq: Every day | ORAL | Status: DC
  Administered 2017-03-17 – 2017-03-19 (×3): 10 mg via ORAL
  Filled 2017-03-17 (×3): qty 1

## 2017-03-17 MED ORDER — SENNA 8.6 MG PO TABS
1.0000 | ORAL_TABLET | Freq: Two times a day (BID) | ORAL | Status: DC
  Administered 2017-03-17 – 2017-03-19 (×4): 8.6 mg via ORAL
  Filled 2017-03-17 (×4): qty 1

## 2017-03-17 MED ORDER — CEFTRIAXONE SODIUM IN DEXTROSE 20 MG/ML IV SOLN: 1.0000 g | INTRAVENOUS | Status: DC

## 2017-03-17 MED ORDER — ONDANSETRON HCL 4 MG/2ML IJ SOLN
4.0000 mg | Freq: Once | INTRAMUSCULAR | Status: AC
  Administered 2017-03-17: 4 mg via INTRAVENOUS
  Filled 2017-03-17: qty 2

## 2017-03-17 MED ORDER — ACETAMINOPHEN 650 MG RE SUPP: 650.0000 mg | Freq: Four times a day (QID) | RECTAL | Status: DC | PRN

## 2017-03-17 MED ORDER — PIPERACILLIN-TAZOBACTAM 3.375 G IVPB 30 MIN: 3.3750 g | Freq: Once | INTRAVENOUS | Status: DC

## 2017-03-17 MED ORDER — ENOXAPARIN SODIUM 30 MG/0.3ML ~~LOC~~ SOLN
30.0000 mg | SUBCUTANEOUS | Status: DC
  Administered 2017-03-17 – 2017-03-18 (×2): 30 mg via SUBCUTANEOUS
  Filled 2017-03-17 (×2): qty 0.3

## 2017-03-17 MED ORDER — CEFTRIAXONE SODIUM IN DEXTROSE 20 MG/ML IV SOLN
1.0000 g | INTRAVENOUS | Status: DC
  Filled 2017-03-17: qty 50

## 2017-03-17 NOTE — Progress Notes (Signed)
Anticoagulation monitoring(Lovenox):  76 yo female ordered Lovenox 40 mg Q24h  Filed Weights   03/17/17 1125  Weight: 120 lb (54.4 kg)   BMI    Lab Results  Component Value Date   CREATININE 1.95 (H) 03/17/2017   CREATININE 1.9 (A) 08/05/2014   CREATININE 1.76 (H) 05/07/2013   Estimated Creatinine Clearance: 21.4 mL/min (A) (by C-G formula based on SCr of 1.95 mg/dL (H)). Hemoglobin & Hematocrit     Component Value Date/Time   HGB 11.7 (L) 03/17/2017 1132   HGB 13.6 05/04/2013 1411   HCT 37.0 03/17/2017 1132   HCT 44.9 05/04/2013 1411     Per Protocol for Patient with estCrcl < 30 ml/min and BMI < 40, will transition to Lovenox 30 mg Q24h.

## 2017-03-17 NOTE — Progress Notes (Signed)
Pharmacy Antibiotic Note  Caitlyn Marshall is a 76 y.o. female admitted on 03/17/2017 with pneumonia.  Pharmacy has been consulted for azithromycin and ceftriaxone dosing.  Plan: azithromycin 500mg  iv q24h & ceftriaxone 1gm iv q24h   Height: 5\' 6"  (167.6 cm) Weight: 120 lb (54.4 kg) IBW/kg (Calculated) : 59.3  Temp (24hrs), Avg:100.6 F (38.1 C), Min:100.6 F (38.1 C), Max:100.6 F (38.1 C)  Recent Labs  Lab 03/17/17 1132  WBC 31.7*  CREATININE 1.95*  LATICACIDVEN 2.6*    Estimated Creatinine Clearance: 21.4 mL/min (A) (by C-G formula based on SCr of 1.95 mg/dL (H)).    No Known Allergies  Antimicrobials this admission: Anti-infectives (From admission, onward)   Start     Dose/Rate Route Frequency Ordered Stop   03/18/17 1300  azithromycin (ZITHROMAX) 500 mg in dextrose 5 % 250 mL IVPB     500 mg 250 mL/hr over 60 Minutes Intravenous Every 24 hours 03/17/17 1327     03/18/17 1300  cefTRIAXone (ROCEPHIN) 1 g in dextrose 5 % 50 mL IVPB - Premix     1 g 100 mL/hr over 30 Minutes Intravenous Every 24 hours 03/17/17 1337     03/18/17 1000  azithromycin (ZITHROMAX) tablet 250 mg     250 mg Oral Daily 03/17/17 1338     03/18/17 0000  cefTRIAXone (ROCEPHIN) 1 g in dextrose 5 % 50 mL IVPB - Premix     1 g 100 mL/hr over 30 Minutes Intravenous Every 24 hours 03/17/17 1338     03/17/17 1400  piperacillin-tazobactam (ZOSYN) IVPB 3.375 g  Status:  Discontinued     3.375 g 12.5 mL/hr over 240 Minutes Intravenous Every 8 hours 03/17/17 1326 03/17/17 1326   03/17/17 1400  piperacillin-tazobactam (ZOSYN) IVPB 3.375 g  Status:  Discontinued     3.375 g 12.5 mL/hr over 240 Minutes Intravenous Every 8 hours 03/17/17 1326 03/17/17 1329   03/17/17 1300  cefTRIAXone (ROCEPHIN) 1 g in dextrose 5 % 50 mL IVPB - Premix     1 g 100 mL/hr over 30 Minutes Intravenous  Once 03/17/17 1246 03/17/17 1326   03/17/17 1300  azithromycin (ZITHROMAX) 500 mg in dextrose 5 % 250 mL IVPB     500 mg 250  mL/hr over 60 Minutes Intravenous  Once 03/17/17 1246     03/17/17 1245  piperacillin-tazobactam (ZOSYN) IVPB 3.375 g  Status:  Discontinued     3.375 g 100 mL/hr over 30 Minutes Intravenous  Once 03/17/17 1244 03/17/17 1246      Microbiology results: No results found for this or any previous visit (from the past 240 hour(s)).  Thank you for allowing pharmacy to be a part of this patient's care.  Donna Christen Heena [place]bury 03/17/2017 1:40 PM

## 2017-03-17 NOTE — Progress Notes (Signed)
MEDICATION RELATED CONSULT NOTE - FOLLOW UP   Pharmacy Consult for electrolyte management  Indication: hypokalemia  No Known Allergies  Patient Measurements: Height: 5\' 6"  (167.6 cm) Weight: 120 lb (54.4 kg) IBW/kg (Calculated) : 59.3  Vital Signs: Temp: 98.4 F (36.9 C) (12/31 2040) Temp Source: Oral (12/31 2040) BP: 128/44 (12/31 2040) Pulse Rate: 99 (12/31 2040) Intake/Output from previous day: No intake/output data recorded. Intake/Output from this shift: No intake/output data recorded.  Labs: Recent Labs    03/17/17 1132 03/17/17 2026  WBC 31.7*  --   HGB 11.7*  --   HCT 37.0  --   PLT 198  --   CREATININE 1.95*  --   MG  --  1.4*  ALBUMIN 3.9  --   PROT 7.1  --   AST 29  --   ALT 12*  --   ALKPHOS 103  --   BILITOT 1.1  --    Estimated Creatinine Clearance: 21.4 mL/min (A) (by C-G formula based on SCr of 1.95 mg/dL (H)).   Microbiology: No results found for this or any previous visit (from the past 720 hour(s)).  Medications:  Medications Prior to Admission  Medication Sig Dispense Refill Last Dose  . allopurinol (ZYLOPRIM) 100 MG tablet TAKE 2 TABLETS BY MOUTH TWICE DAILY 360 tablet 4 Past Week at 2000  . amLODipine (NORVASC) 10 MG tablet Take 10 mg by mouth daily.   03/16/2017 at 0800  . aspirin EC 81 MG tablet Take 81 mg by mouth daily.    03/16/2017 at 0800  . hydrochlorothiazide (HYDRODIURIL) 12.5 MG tablet TK 1 T PO QAM UTD FOR LEG SWELLING  11 03/16/2017 at 0800  . insulin NPH Human (NOVOLIN N) 100 UNIT/ML injection Inject 6-7 Units into the skin 2 (two) times daily before a meal.    03/16/2017 at 1900  . insulin regular (NOVOLIN R,HUMULIN R) 100 units/mL injection Inject 5-9 Units into the skin 3 (three) times daily before meals. 5 units in AM, 7 units at lunch and 9 units in PM   03/17/2017 at 0800  . Insulin Syringe-Needle U-100 (INSULIN SYRINGE .5CC/30GX5/16") 30G X 5/16" 0.5 ML MISC U QID UTD  5 Taking  . irbesartan (AVAPRO) 300 MG tablet  Take 300 mg by mouth every evening.  8 03/16/2017 at 0800  . levothyroxine (SYNTHROID, LEVOTHROID) 50 MCG tablet Take 1 tablet by mouth daily.  5 03/16/2017 at 0730  . lovastatin (MEVACOR) 20 MG tablet TAKE 1 TABLET BY MOUTH DAILY 90 tablet 3 03/16/2017 at 0800  . montelukast (SINGULAIR) 10 MG tablet TAKE 1 TABLET BY MOUTH EVERY DAY 90 tablet 4 03/16/2017 at 0800  . ONE TOUCH ULTRA TEST test strip TEST THREE TIMES DAILY 100 each 5 Taking  . ONETOUCH DELICA LANCETS 16X MISC INJECT AS DIRECTED THREE TIMES DAILY   Taking   Scheduled:  . [START ON 03/18/2017] albuterol  2.5 mg Nebulization TID  . aspirin EC  81 mg Oral Daily  . enoxaparin (LOVENOX) injection  30 mg Subcutaneous Q24H  . insulin aspart  0-9 Units Subcutaneous TID WC  . insulin detemir  8 Units Subcutaneous BID AC & HS  . levothyroxine  50 mcg Oral QAC breakfast  . montelukast  10 mg Oral Daily  . pravastatin  10 mg Oral q1800  . senna  1 tablet Oral BID   Infusions:  . sodium chloride 100 mL/hr at 03/17/17 1502  . [START ON 03/18/2017] azithromycin    . [START ON  03/18/2017] small volume/piggyback builder    . magnesium sulfate 1 - 4 g bolus IVPB     PRN: acetaminophen **OR** acetaminophen, ondansetron **OR** ondansetron (ZOFRAN) IV, polyethylene glycol Anti-infectives (From admission, onward)   Start     Dose/Rate Route Frequency Ordered Stop   03/18/17 1300  azithromycin (ZITHROMAX) 500 mg in dextrose 5 % 250 mL IVPB     500 mg 250 mL/hr over 60 Minutes Intravenous Every 24 hours 03/17/17 1327     03/18/17 1300  cefTRIAXone (ROCEPHIN) 1 g in dextrose 5 % 50 mL IVPB - Premix  Status:  Discontinued     1 g 100 mL/hr over 30 Minutes Intravenous Every 24 hours 03/17/17 1337 03/17/17 1658   03/18/17 1300  cefTRIAXone (ROCEPHIN) 1 g in dextrose 5 % 50 mL injection      Intravenous Every 24 hours 03/17/17 1658     03/18/17 1000  azithromycin (ZITHROMAX) tablet 250 mg  Status:  Discontinued     250 mg Oral Daily 03/17/17 1338  03/17/17 1450   03/18/17 1000  cefTRIAXone (ROCEPHIN) 1 g in dextrose 5 % 50 mL injection  Status:  Discontinued      Intravenous Every 24 hours 03/17/17 1445 03/17/17 1453   03/18/17 0000  cefTRIAXone (ROCEPHIN) 1 g in dextrose 5 % 50 mL IVPB - Premix  Status:  Discontinued     1 g 100 mL/hr over 30 Minutes Intravenous Every 24 hours 03/17/17 1338 03/17/17 1444   03/17/17 1400  piperacillin-tazobactam (ZOSYN) IVPB 3.375 g  Status:  Discontinued     3.375 g 12.5 mL/hr over 240 Minutes Intravenous Every 8 hours 03/17/17 1326 03/17/17 1326   03/17/17 1400  piperacillin-tazobactam (ZOSYN) IVPB 3.375 g  Status:  Discontinued     3.375 g 12.5 mL/hr over 240 Minutes Intravenous Every 8 hours 03/17/17 1326 03/17/17 1329   03/17/17 1300  cefTRIAXone (ROCEPHIN) 1 g in dextrose 5 % 50 mL IVPB - Premix     1 g 100 mL/hr over 30 Minutes Intravenous  Once 03/17/17 1246 03/17/17 1343   03/17/17 1300  azithromycin (ZITHROMAX) 500 mg in dextrose 5 % 250 mL IVPB     500 mg 250 mL/hr over 60 Minutes Intravenous  Once 03/17/17 1246 03/17/17 1450   03/17/17 1245  piperacillin-tazobactam (ZOSYN) IVPB 3.375 g  Status:  Discontinued     3.375 g 100 mL/hr over 30 Minutes Intravenous  Once 03/17/17 1244 03/17/17 1246      Assessment: 76 year old female requiring electrolyte replacement per pharmacy protocol.    Upon admission: Lab Results  Component Value Date   CREATININE 1.95 (H) 03/17/2017   BUN 23 (H) 03/17/2017   NA 138 03/17/2017   K 3.7 03/17/2017   CL 109 03/17/2017   CO2 18 (L) 03/17/2017      Goal of Therapy:  K+ 3.5 - 5.1   Plan:  KCl 77meq IV x 4, recheck 1 hour after last dose, will also order Magnesium for this time. BMP in the morning per protocol.  12/31 @ 20:26:  K = 3.7,  Mag = 1.4  Will order Magnesium sulfate 2 gm IV X 1 . Will recheck electrolytes on 1/1 with AM labs.   Cherrell Maybee D 03/17/2017,9:06 PM

## 2017-03-17 NOTE — ED Notes (Signed)
Patient transported to X-ray 

## 2017-03-17 NOTE — ED Provider Notes (Signed)
[facility] Emergency Department Provider Note   ____________________________________________   First MD Initiated Contact with Patient 03/17/17 1148     (approximate)  I have reviewed the triage vital signs and the nursing notes.   HISTORY  Chief Complaint Emesis    HPI Caitlyn Marshall is a 76 y.o. female known diabetes chronic kidney disease presents for evaluation of vomiting and fever  Patient and her family report that yesterday she did not feel well, fatigued, feeling generally weak and began vomiting last night.  She feels dehydrated.  She denies chest pain or trouble breathing.  She does report last night that she had some right-sided pain in her upper abdomen with vomiting but this is since gone away.  Currently not any pain or discomfort.  Experiencing fevers and chills.  No shortness of breath.  Pain is experience last night was aching, worsened by vomiting.  Hospitalizations, lives at home.  Does not live in a nursing facility.  Is using her insulin regularly    Past Medical History:  Diagnosis Date  . Chronic kidney disease   . Diabetes mellitus without complication ([place])   . Gout   . Hyperlipidemia   . Hypertension   . Thyroid disease     Patient Active Problem List   Diagnosis Date Noted  . Sepsis ([place]) 03/17/2017  . Insomnia 07/12/2015  . Hypertension 07/12/2015  . Poor appetite 07/12/2015  . Constipation 07/12/2015  . Bursitis 05/31/2015  . 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 Leg swelling 05/31/2015  . Allergic rhinitis 12/26/2014  . Vitamin D deficiency 12/20/2014  . Anxiety 12/20/2014  . Hypercholesterolemia 12/20/2014  . Anemia 12/20/2014  . Hypothyroidism 12/20/2014  . Hypertensive CKD (chronic kidney disease) 12/20/2014  . Premature ventricular contraction 12/20/2014  . PVD (peripheral vascular disease) ([place]) 12/20/2014  . Gout 12/20/2014  . Osteoarthritis  12/20/2014  . Renal insufficiency 12/20/2014  . Chronic kidney disease (CKD), stage III (moderate) ([place]) 12/20/2014    Past Surgical History:  Procedure Laterality Date  . TUBAL LIGATION    . Vascular Stent in right leg      Prior to Admission medications   Medication Sig Start Date End Date Taking? Authorizing Provider  allopurinol (ZYLOPRIM) 100 MG tablet TAKE 2 TABLETS BY MOUTH TWICE DAILY 11/28/16  Yes Birdie Sons, MD  amLODipine (NORVASC) 10 MG tablet Take 10 mg by mouth daily. 12/21/14  Yes [provider]  aspirin EC 81 MG tablet Take 81 mg by mouth daily.    Yes [provider]  hydrochlorothiazide (HYDRODIURIL) 12.5 MG tablet TK 1 T PO QAM UTD FOR LEG SWELLING 11/16/14  Yes [provider]  insulin NPH Human (NOVOLIN N) 100 UNIT/ML injection Inject 6-7 Units into the skin 2 (two) times daily before a meal.    Yes [provider]  insulin regular (NOVOLIN R,HUMULIN R) 100 units/mL injection Inject 5-9 Units into the skin 3 (three) times daily before meals. 5 units in AM, 7 units at lunch and 9 units in PM   Yes [provider]  Insulin Syringe-Needle U-100 (INSULIN SYRINGE .5CC/30GX5/16") 30G X 5/16" 0.5 ML MISC U QID UTD 09/13/14  Yes [provider]  irbesartan (AVAPRO) 300 MG tablet Take 300 mg by mouth every evening. 01/02/17  Yes [provider]  levothyroxine (SYNTHROID, LEVOTHROID) 50 MCG tablet Take 1 tablet by mouth daily. 10/21/14  Yes [provider]  lovastatin (MEVACOR) 20 MG  tablet TAKE 1 TABLET BY MOUTH DAILY 03/13/17  Yes Birdie Sons, MD  montelukast (SINGULAIR) 10 MG tablet TAKE 1 TABLET BY MOUTH EVERY DAY 12/24/16  Yes Birdie Sons, MD  ONE TOUCH ULTRA TEST test strip TEST THREE TIMES DAILY 09/06/14  Yes Jerrol Banana., MD  [facility] DELICA LANCETS 81K MISC INJECT AS DIRECTED THREE TIMES DAILY 11/30/14  Yes [provider]    Allergies Patient has no known  allergies.  Family History  Problem Relation Age of Onset  . Hypertension Mother   . Diabetes Mother        Diabetes mellitus Type 2  . Alzheimer's disease Mother   . Cancer Son        died at age 60, esophagus    Social History Social History   Tobacco Use  . Smoking status: Former Smoker    Packs/day: 0.50    Years: 30.00    Pack years: 15.00    Types: Cigarettes    Last attempt to quit: 03/18/2007    Years since quitting: 10.0  . Smokeless tobacco: Never Used  . Tobacco comment: Quit smoking in 2008  Substance Use Topics  . Alcohol use: No    Alcohol/week: 0.0 oz  . Drug use: No    Review of Systems Constitutional: See HPI eyes: No visual changes. ENT: No sore throat. Cardiovascular: Denies chest pain. Respiratory: Denies shortness of breath. Gastrointestinal: No diarrhea.  No constipation.  Chronic loose stools, unchanged. Genitourinary: Negative for dysuria. Musculoskeletal: Negative for back pain. Skin: Negative for rash. Neurological: Negative for headaches, focal weakness or numbness.    ____________________________________________   PHYSICAL EXAM:  VITAL SIGNS: ED Triage Vitals  Enc Vitals Group     BP 03/17/17 1124 (!) 144/42     Pulse Rate 03/17/17 1124 (!) 114     Resp 03/17/17 1124 (!) 26     Temp 03/17/17 1124 (!) 100.6 F (38.1 C)     Temp Source 03/17/17 1124 Oral     SpO2 03/17/17 1124 96 %     Weight 03/17/17 1125 120 lb (54.4 kg)     Height 03/17/17 1125 5\' 6"  (1.676 m)     Head Circumference --      Peak Flow --      Pain Score 03/17/17 1124 8     Pain Loc --      Pain Edu? --      Excl. in [place]? --     Constitutional: Alert and oriented.  Generally ill-appearing, pleasant and in no distress Eyes: Conjunctivae are normal. Head: Atraumatic. Nose: No congestion/rhinnorhea. Mouth/Throat: Mucous membranes are very dry with dried yellow emesis in the mouth  neck: No stridor.   Cardiovascular: Tachycardic rate, regular rhythm.  Grossly normal heart sounds.  Good peripheral circulation. Respiratory: Normal respiratory effort.  No retractions. Lungs CTAB. Gastrointestinal: Soft and nontender. No distention.  Negative Murphy.  No pain in any quadrant.  No peritonitis in any quadrant.  No distention.  No CVA tenderness. Musculoskeletal: No lower extremity tenderness nor edema. Neurologic:  Normal speech and language. No gross focal neurologic deficits are appreciated.  Skin:  Skin is warm, dry and intact. No rash noted. Psychiatric: Mood and affect are normal. Speech and behavior are normal.  ____________________________________________   LABS (all labs ordered are listed, but only abnormal results are displayed)  Labs Reviewed  LACTIC ACID, PLASMA - Abnormal; Notable for the following components:      Result Value  Lactic Acid, Venous 2.6 (*)    All other components within normal limits  LACTIC ACID, PLASMA - Abnormal; Notable for the following components:   Lactic Acid, Venous 2.3 (*)    All other components within normal limits  COMPREHENSIVE METABOLIC PANEL - Abnormal; Notable for the following components:   Potassium 2.9 (*)    CO2 18 (*)    Glucose, Bld 370 (*)    BUN 23 (*)    Creatinine, Ser 1.95 (*)    ALT 12 (*)    GFR calc non Af Amer 24 (*)    GFR calc Af Amer 28 (*)    All other components within normal limits  CBC WITH DIFFERENTIAL/PLATELET - Abnormal; Notable for the following components:   WBC 31.7 (*)    Hemoglobin 11.7 (*)    MCHC 31.5 (*)    RDW 17.0 (*)    Neutro Abs 29.7 (*)    Lymphs Abs 0.7 (*)    Monocytes Absolute 1.1 (*)    All other components within normal limits  URINALYSIS, COMPLETE (UACMP) WITH MICROSCOPIC - Abnormal; Notable for the following components:   Color, Urine YELLOW (*)    APPearance HAZY (*)    Glucose, UA 50 (*)    Hgb urine dipstick SMALL (*)    All other components within normal limits  BLOOD GAS, VENOUS - Abnormal; Notable for the following  components:   pCO2, Ven 32 (*)    Bicarbonate 19.4 (*)    Acid-base deficit 4.7 (*)    All other components within normal limits  GLUCOSE, CAPILLARY - Abnormal; Notable for the following components:   Glucose-Capillary 342 (*)    All other components within normal limits  GLUCOSE, CAPILLARY - Abnormal; Notable for the following components:   Glucose-Capillary 175 (*)    All other components within normal limits  CULTURE, BLOOD (ROUTINE X 2)  CULTURE, BLOOD (ROUTINE X 2)  LIPASE, BLOOD  BETA-HYDROXYBUTYRIC ACID  POTASSIUM  MAGNESIUM  BASIC METABOLIC PANEL  CBC   ____________________________________________  EKG  Reviewed by me 11:30 AM no sinus tachycardia, Start heart rate 110 QRS 89 QTC 460 No evidence of acute ischemia detected though nonspecific T wave abnormality is seen in inferolateral areas ____________________________________________  RADIOLOGY  Dg Chest 2 View  Addendum Date: 03/17/2017   ADDENDUM REPORT: 03/17/2017 12:50 ADDENDUM: The opacity in right lower lobe has a somewhat nodular configuration. It may be prudent to correlate with noncontrast enhanced chest CT to further assess in this regard. Electronically Signed   By: Lowella Grip III M.D.   On: 03/17/2017 12:50   Result Date: 03/17/2017 CLINICAL DATA:  Vomiting in the low 80s.  Hyperglycemia EXAM: CHEST  2 VIEW COMPARISON:  May 04, 2013 FINDINGS: There is airspace consolidation in the right upper lobe, right base, and lingula. Lungs elsewhere clear. Heart size and pulmonary vascularity are normal. No adenopathy. There is aortic atherosclerosis. No bone lesions. IMPRESSION: Multifocal pneumonia. Heart size normal. No evident adenopathy. There is aortic atherosclerosis. Followup PA and lateral chest radiographs recommended in 3-4 weeks following trial of antibiotic therapy to ensure resolution and exclude underlying malignancy. Aortic Atherosclerosis (ICD10-I70.0). Electronically Signed: By: Lowella Grip III M.D. On: 03/17/2017 12:41   Dg Abd 2 Views  Result Date: 03/17/2017 CLINICAL DATA:  Fever, vomiting, generalized illness, question sepsis history hypertension, diabetes mellitus, chronic kidney disease EXAM: ABDOMEN - 2 VIEW COMPARISON:  CT abdomen and pelvis 06/27/2012, chest radiographs 05/04/2013 FINDINGS: Diffuse interstitial prominence. Patchy  infiltrate RIGHT upper lobe with mild atelectasis versus infiltrate at RIGHT base. At lower LEFT chest an area of atelectasis versus pulmonary nodules identified 24 x 18 mm. Multiple calcifications in the upper mid abdomen consistent with chronic calcific pancreatitis. Scattered atherosclerotic calcifications with prior stenting of the iliac arteries bilaterally. Tubal ligation clips in pelvis. Nonobstructive bowel gas pattern. Few nonspecific upper normal caliber loops of small bowel in the mid abdomen and pelvis. No definite bowel wall thickening or free air. Bones diffusely demineralized. IMPRESSION: Nonobstructive bowel gas pattern. Chronic calcific pancreatitis. Patchy RIGHT lung infiltrates and interstitial prominence in both lungs new since 2015 with questionable nodular density 24 x 18 mm at LEFT base; CT chest recommended to exclude pulmonary nodule. Electronically Signed   By: Lavonia Dana M.D.   On: 03/17/2017 13:00   US Abdomen Limited Ruq  Result Date: 03/17/2017 CLINICAL DATA:  Right upper quadrant pain for 1 week EXAM: ULTRASOUND ABDOMEN LIMITED RIGHT UPPER QUADRANT COMPARISON:  None. FINDINGS: Gallbladder: No gallstones or wall thickening visualized. No sonographic Murphy sign noted by sonographer. Common bile duct: Diameter: 5 mm Liver: No focal lesion identified. Within normal limits in parenchymal echogenicity. Portal vein is patent on color Doppler imaging with normal direction of blood flow towards the liver. IMPRESSION: No acute abnormality noted. Electronically Signed   By: Inez Catalina M.D.   On: 03/17/2017 13:25   Chest  x-ray reviewed as multifocal pneumonia,, in addition a somewhat nodular configuration is seen in the right lower lobe  Right upper quadrant ultrasound negative for acute ____________________________________________   PROCEDURES  Procedure(s) performed: None  Procedures  Critical Care performed: Yes, see critical care note(s)  CRITICAL CARE Performed by: Delman Kitten   Total critical care time: 40 minutes  Critical care time was exclusive of separately billable procedures and treating other patients.  Critical care was necessary to treat or prevent imminent or life-threatening deterioration.  Critical care was time spent personally by me on the following activities: development of treatment plan with patient and/or surrogate as well as nursing, discussions with consultants, evaluation of patient's response to treatment, examination of patient, obtaining history from patient or surrogate, ordering and performing treatments and interventions, ordering and review of laboratory studies, ordering and review of radiographic studies, pulse oximetry and re-evaluation of patient's condition.  ____________________________________________   INITIAL IMPRESSION / ASSESSMENT AND PLAN / ED COURSE  Pertinent labs & imaging results that were available during my care of the patient were reviewed by me and considered in my medical decision making (see chart for details).  Patient returns for evaluation of vomiting, severe with some right-sided abdominal pain that she reports was due to vomiting last night.  No to be febrile.  Type I diabetic.  No focal abdominal discomfort on exam, no peritoneal symptoms or evidence of an acute surgical abdomen.  However given the patient's elevated fever, tachycardia and diabetes and will submit a broad evaluation for etiology.     ED Sepsis - Repeat Assessment   Performed at:    ----------------------------------------- 12:56 PM on  03/17/2017 -----------------------------------------    Last Vitals by RN in chart:    Blood pressure (!) 144/42, pulse (!) 114, temperature (!) 100.6 F (38.1 C), temperature source Oral, resp. rate (!) 26, height 5\' 6"  (1.676 m), weight 54.4 kg (120 lb), SpO2 96 %.  Heart:      Clear tones, heart rate 90  Lungs:     Clear bilateral  Capillary Refill:   Less than  2 seconds  Peripheral Pulse (include location): Right radial   Skin (include color):   Warm, pink  ____________________________________________  Based on current assessment, suspect multifocal pneumonia.  The patient did report abdominal discomfort associated with vomiting last night, and though currently not reporting any pain I have ordered a right upper quadrant ultrasound to evaluate for potential gallbladder etiology.  Denies overall suspicion for acute intra-abdominal infection based on current assessment and evaluation is however low.  Discussed with the hospitalist service, Dr. Posey Pronto will be admitting the patient, understands the right upper quadrant ultrasound is currently pending study at this time.  FINAL CLINICAL IMPRESSION(S) / ED DIAGNOSES  Final diagnoses:  Abdominal pain  Sepsis, due to unspecified organism [facility])  Community acquired pneumonia, unspecified laterality  Multifocal pneumonia  Lung nodule      NEW MEDICATIONS STARTED DURING THIS VISIT:  This SmartLink is deprecated. Use AVSMEDLIST instead to display the medication list for a patient.   Note:  This document was prepared using Dragon voice recognition software and may include unintentional dictation errors.     Delman Kitten, MD 03/17/17 [phone]

## 2017-03-17 NOTE — H&P (Signed)
[facility] NAME: Caitlyn Marshall    MR#:  [id number]  DATE OF BIRTH:  Dec 27, 1940  DATE OF ADMISSION:  03/17/2017  PRIMARY CARE PHYSICIAN: Birdie Sons, MD   REQUESTING/REFERRING PHYSICIAN: Dr. Jacqualine Code  CHIEF COMPLAINT:   Vomiting, dizziness, cough and not feeling well since yesterday HISTORY OF PRESENT ILLNESS:  Caitlyn Marshall  is a 76 y.o. female with a known history of chronic kidney disease stage III, diabetes, gout, hypertension comes to the emergency room with dizziness not feeling well had an episode of vomiting this morning and some cough.  Patient denies any fever.  She is not the best historian. Workup in the ER showed white count of 31,000, tachycardia, lactic acidosis and chest x-ray consistent with right upper lobe pneumonia.  Patient received IV Rocephin and relax.  She is being admitted with sepsis secondary to right-sided pneumonia.  PAST MEDICAL HISTORY:   Past Medical History:  Diagnosis Date  . Chronic kidney disease   . Diabetes mellitus without complication ([facility])   . Gout   . Hyperlipidemia   . Hypertension   . Thyroid disease     PAST SURGICAL HISTOIRY:   Past Surgical History:  Procedure Laterality Date  . TUBAL LIGATION    . Vascular Stent in right leg      SOCIAL HISTORY:   Social History   Tobacco Use  . Smoking status: Former Smoker    Packs/day: 0.50    Years: 30.00    Pack years: 15.00    Types: Cigarettes    Last attempt to quit: 03/18/2007    Years since quitting: 10.0  . Smokeless tobacco: Never Used  . Tobacco comment: Quit smoking in 2008  Substance Use Topics  . Alcohol use: No    Alcohol/week: 0.0 oz    FAMILY HISTORY:   Family History  Problem Relation Age of Onset  . Hypertension Mother   . Diabetes Mother        Diabetes mellitus Type 2  . Alzheimer's disease Mother   . Cancer Son        died at age 61, esophagus    DRUG ALLERGIES:  No Known  Allergies  REVIEW OF SYSTEMS:  Review of Systems  Constitutional: Negative for chills, fever and weight loss.  HENT: Negative for ear discharge, ear pain and nosebleeds.   Eyes: Negative for blurred vision, pain and discharge.  Respiratory: Positive for cough. Negative for sputum production, shortness of breath, wheezing and stridor.   Cardiovascular: Negative for chest pain, palpitations, orthopnea and PND.  Gastrointestinal: Positive for nausea and vomiting. Negative for abdominal pain and diarrhea.  Genitourinary: Negative for frequency and urgency.  Musculoskeletal: Negative for back pain and joint pain.  Neurological: Positive for weakness. Negative for sensory change, speech change and focal weakness.  Psychiatric/Behavioral: Negative for depression and hallucinations. The patient is not nervous/anxious.      MEDICATIONS AT HOME:   Prior to Admission medications   Medication Sig Start Date End Date Taking? Authorizing Provider  allopurinol (ZYLOPRIM) 100 MG tablet TAKE 2 TABLETS BY MOUTH TWICE DAILY 11/28/16  Yes Birdie Sons, MD  amLODipine (NORVASC) 10 MG tablet Take 10 mg by mouth daily. 12/21/14  Yes [provider]  aspirin EC 81 MG tablet Take 81 mg by mouth daily.    Yes [provider]  hydrochlorothiazide (HYDRODIURIL) 12.5 MG tablet TK 1 T PO QAM UTD FOR LEG SWELLING 11/16/14  Yes  [provider]  insulin NPH Human (NOVOLIN N) 100 UNIT/ML injection Inject 6-7 Units into the skin 2 (two) times daily before a meal.    Yes [provider]  insulin regular (NOVOLIN R,HUMULIN R) 100 units/mL injection Inject 5-9 Units into the skin 3 (three) times daily before meals. 5 units in AM, 7 units at lunch and 9 units in PM   Yes [provider]  Insulin Syringe-Needle U-100 (INSULIN SYRINGE .5CC/30GX5/16") 30G X 5/16" 0.5 ML MISC U QID UTD 09/13/14  Yes [provider]  irbesartan (AVAPRO) 300 MG tablet Take 300 mg by mouth every  evening. 01/02/17  Yes [provider]  levothyroxine (SYNTHROID, LEVOTHROID) 50 MCG tablet Take 1 tablet by mouth daily. 10/21/14  Yes [provider]  lovastatin (MEVACOR) 20 MG tablet TAKE 1 TABLET BY MOUTH DAILY 03/13/17  Yes Birdie Sons, MD  montelukast (SINGULAIR) 10 MG tablet TAKE 1 TABLET BY MOUTH EVERY DAY 12/24/16  Yes Birdie Sons, MD  ONE TOUCH ULTRA TEST test strip TEST THREE TIMES DAILY 09/06/14  Yes Jerrol Banana., MD  [facility] DELICA LANCETS 76H MISC INJECT AS DIRECTED THREE TIMES DAILY 11/30/14  Yes [provider]      VITAL SIGNS:  Blood pressure (!) 136/55, pulse 98, temperature (!) 100.6 F (38.1 C), temperature source Oral, resp. rate (!) 23, height 5\' 6"  (1.676 m), weight 54.4 kg (120 lb), SpO2 100 %.  PHYSICAL EXAMINATION:  GENERAL:  76 y.o.-year-old patient lying in the bed with no acute distress.  Thin cachectic. EYES: Pupils equal, round, reactive to light and accommodation. No scleral icterus. Extraocular muscles intact.  HEENT: Head atraumatic, normocephalic. Oropharynx and nasopharynx clear.  Poor oral hygiene NECK:  Supple, no jugular venous distention. No thyroid enlargement, no tenderness.  LUNGS: Normal breath sounds bilaterally, no wheezing, rales,rhonchi or crepitation. No use of accessory muscles of respiration.  CARDIOVASCULAR: S1, S2 normal. No murmurs, rubs, or gallops.  ABDOMEN: Soft, nontender, nondistended. Bowel sounds present. No organomegaly or mass.  EXTREMITIES: No pedal edema, cyanosis, or clubbing.  NEUROLOGIC: Cranial nerves II through XII are intact. Muscle strength 5/5 in all extremities. Sensation intact. Gait not checked.  PSYCHIATRIC: The patient is alert and oriented x 3.  SKIN: No obvious rash, lesion, or ulcer.   LABORATORY PANEL:   CBC Recent Labs  Lab 03/17/17 1132  WBC 31.7*  HGB 11.7*  HCT 37.0  PLT 198    ------------------------------------------------------------------------------------------------------------------  Chemistries  Recent Labs  Lab 03/17/17 1132  NA 138  K 2.9*  CL 109  CO2 18*  GLUCOSE 370*  BUN 23*  CREATININE 1.95*  CALCIUM 9.0  AST 29  ALT 12*  ALKPHOS 103  BILITOT 1.1   ------------------------------------------------------------------------------------------------------------------  Cardiac Enzymes No results for input(s): TROPONINI in the last 168 hours. ------------------------------------------------------------------------------------------------------------------  RADIOLOGY:  Dg Chest 2 View  Addendum Date: 03/17/2017   ADDENDUM REPORT: 03/17/2017 12:50 ADDENDUM: The opacity in right lower lobe has a somewhat nodular configuration. It may be prudent to correlate with noncontrast enhanced chest CT to further assess in this regard. Electronically Signed   By: Lowella Grip III M.D.   On: 03/17/2017 12:50   Result Date: 03/17/2017 CLINICAL DATA:  Vomiting in the low 80s.  Hyperglycemia EXAM: CHEST  2 VIEW COMPARISON:  May 04, 2013 FINDINGS: There is airspace consolidation in the right upper lobe, right base, and lingula. Lungs elsewhere clear. Heart size and pulmonary vascularity are normal. No adenopathy. There is  aortic atherosclerosis. No bone lesions. IMPRESSION: Multifocal pneumonia. Heart size normal. No evident adenopathy. There is aortic atherosclerosis. Followup PA and lateral chest radiographs recommended in 3-4 weeks following trial of antibiotic therapy to ensure resolution and exclude underlying malignancy. Aortic Atherosclerosis (ICD10-I70.0). Electronically Signed: By: Lowella Grip III M.D. On: 03/17/2017 12:41   Dg Abd 2 Views  Result Date: 03/17/2017 CLINICAL DATA:  Fever, vomiting, generalized illness, question sepsis history hypertension, diabetes mellitus, chronic kidney disease EXAM: ABDOMEN - 2 VIEW COMPARISON:  CT  abdomen and pelvis 06/27/2012, chest radiographs 05/04/2013 FINDINGS: Diffuse interstitial prominence. Patchy infiltrate RIGHT upper lobe with mild atelectasis versus infiltrate at RIGHT base. At lower LEFT chest an area of atelectasis versus pulmonary nodules identified 24 x 18 mm. Multiple calcifications in the upper mid abdomen consistent with chronic calcific pancreatitis. Scattered atherosclerotic calcifications with prior stenting of the iliac arteries bilaterally. Tubal ligation clips in pelvis. Nonobstructive bowel gas pattern. Few nonspecific upper normal caliber loops of small bowel in the mid abdomen and pelvis. No definite bowel wall thickening or free air. Bones diffusely demineralized. IMPRESSION: Nonobstructive bowel gas pattern. Chronic calcific pancreatitis. Patchy RIGHT lung infiltrates and interstitial prominence in both lungs new since 2015 with questionable nodular density 24 x 18 mm at LEFT base; CT chest recommended to exclude pulmonary nodule. Electronically Signed   By: Lavonia Dana M.D.   On: 03/17/2017 13:00   US Abdomen Limited Ruq  Result Date: 03/17/2017 CLINICAL DATA:  Right upper quadrant pain for 1 week EXAM: ULTRASOUND ABDOMEN LIMITED RIGHT UPPER QUADRANT COMPARISON:  None. FINDINGS: Gallbladder: No gallstones or wall thickening visualized. No sonographic Murphy sign noted by sonographer. Common bile duct: Diameter: 5 mm Liver: No focal lesion identified. Within normal limits in parenchymal echogenicity. Portal vein is patent on color Doppler imaging with normal direction of blood flow towards the liver. IMPRESSION: No acute abnormality noted. Electronically Signed   By: Inez Catalina M.D.   On: 03/17/2017 13:25    EKG:  Sinus tachycardia  IMPRESSION AND PLAN:    Caitlyn Marshall  is a 76 y.o. female with a known history of chronic kidney disease stage III, diabetes, gout, hypertension comes to the emergency room with dizziness not feeling well had an episode of vomiting  this morning and some cough.    1.  Sepsis secondary to right-sided pneumonia -Patient presented with cough, vomiting generalized weakness leukocytosis lactic acidosis tachycardia and positive chest x-ray -IV Rocephin and Zithromax -Follow blood culture -Incentive spirometer and nebs as needed  2.  Uncontrolled diabetes in the setting of 1 -Continue NPH 8 units twice daily and sliding scale insulin.  Patient's last A1c was 8.1 -She follows with endocrinology as outpatient  3.  Hypokalemia replete IV and oral  4.  Clinically dehydrated IV fluids  5.  Leukocytosis due to #1  6.  DVT prophylaxis subcu Lovenox  Above was discussed with patient and daughter in the room   All the records are reviewed and case discussed with ED provider. Management plans discussed with the patient, family and they are in agreement.  CODE STATUS: full  TOTAL TIME TAKING CARE OF THIS PATIENT: *50* minutes.    Fritzi Mandes M.D on 03/17/2017 at 2:19 PM  Between 7am to 6pm - Pager - [phone]  After 6pm go to [email or web address] - password [email or web address] [facility]  [facility]  Office  [phone]  CC: Primary care physician; Birdie Sons, MD

## 2017-03-17 NOTE — ED Notes (Signed)
ED Provider at bedside. 

## 2017-03-17 NOTE — ED Triage Notes (Signed)
Pt here for vomiting and general malaise.  Has Type I DM.  Vomiting since last night. Blood sugars elevated. Unsure if has taken insulin.  Low grade fever with ST in triage.  C/o upper abdominal pain as well.  Tachypnea in triage.

## 2017-03-17 NOTE — Progress Notes (Signed)
MEDICATION RELATED CONSULT NOTE - FOLLOW UP   Pharmacy Consult for electrolyte management  Indication: hypokalemia  No Known Allergies  Patient Measurements: Height: 5\' 6"  (167.6 cm) Weight: 120 lb (54.4 kg) IBW/kg (Calculated) : 59.3  Vital Signs: Temp: 100.6 F (38.1 C) (12/31 1124) Temp Source: Oral (12/31 1124) BP: 133/69 (12/31 1341) Pulse Rate: 98 (12/31 1330) Intake/Output from previous day: No intake/output data recorded. Intake/Output from this shift: No intake/output data recorded.  Labs: Recent Labs    03/17/17 1132  WBC 31.7*  HGB 11.7*  HCT 37.0  PLT 198  CREATININE 1.95*  ALBUMIN 3.9  PROT 7.1  AST 29  ALT 12*  ALKPHOS 103  BILITOT 1.1   Estimated Creatinine Clearance: 21.4 mL/min (A) (by C-G formula based on SCr of 1.95 mg/dL (H)).   Microbiology: No results found for this or any previous visit (from the past 720 hour(s)).  Medications:   (Not in a hospital admission) Scheduled:  . albuterol  2.5 mg Nebulization Q6H  . [START ON 03/18/2017] azithromycin  250 mg Oral Daily  . enoxaparin (LOVENOX) injection  40 mg Subcutaneous Q24H  . insulin aspart  0-9 Units Subcutaneous TID WC  . senna  1 tablet Oral BID   Infusions:  . sodium chloride    . azithromycin 500 mg (03/17/17 1344)  . [START ON 03/18/2017] azithromycin    . [START ON 03/18/2017] cefTRIAXone (ROCEPHIN)  IV    . [START ON 03/18/2017] cefTRIAXone (ROCEPHIN)  IV    . potassium chloride     PRN: acetaminophen **OR** acetaminophen, ondansetron **OR** ondansetron (ZOFRAN) IV, polyethylene glycol Anti-infectives (From admission, onward)   Start     Dose/Rate Route Frequency Ordered Stop   03/18/17 1300  azithromycin (ZITHROMAX) 500 mg in dextrose 5 % 250 mL IVPB     500 mg 250 mL/hr over 60 Minutes Intravenous Every 24 hours 03/17/17 1327     03/18/17 1300  cefTRIAXone (ROCEPHIN) 1 g in dextrose 5 % 50 mL IVPB - Premix     1 g 100 mL/hr over 30 Minutes Intravenous Every 24 hours  03/17/17 1337     03/18/17 1000  azithromycin (ZITHROMAX) tablet 250 mg     250 mg Oral Daily 03/17/17 1338     03/18/17 0000  cefTRIAXone (ROCEPHIN) 1 g in dextrose 5 % 50 mL IVPB - Premix     1 g 100 mL/hr over 30 Minutes Intravenous Every 24 hours 03/17/17 1338     03/17/17 1400  piperacillin-tazobactam (ZOSYN) IVPB 3.375 g  Status:  Discontinued     3.375 g 12.5 mL/hr over 240 Minutes Intravenous Every 8 hours 03/17/17 1326 03/17/17 1326   03/17/17 1400  piperacillin-tazobactam (ZOSYN) IVPB 3.375 g  Status:  Discontinued     3.375 g 12.5 mL/hr over 240 Minutes Intravenous Every 8 hours 03/17/17 1326 03/17/17 1329   03/17/17 1300  cefTRIAXone (ROCEPHIN) 1 g in dextrose 5 % 50 mL IVPB - Premix     1 g 100 mL/hr over 30 Minutes Intravenous  Once 03/17/17 1246 03/17/17 1343   03/17/17 1300  azithromycin (ZITHROMAX) 500 mg in dextrose 5 % 250 mL IVPB     500 mg 250 mL/hr over 60 Minutes Intravenous  Once 03/17/17 1246     03/17/17 1245  piperacillin-tazobactam (ZOSYN) IVPB 3.375 g  Status:  Discontinued     3.375 g 100 mL/hr over 30 Minutes Intravenous  Once 03/17/17 1244 03/17/17 1246      Assessment: 76  year old female requiring electrolyte replacement per pharmacy protocol.    Upon admission: Lab Results  Component Value Date   CREATININE 1.95 (H) 03/17/2017   BUN 23 (H) 03/17/2017   NA 138 03/17/2017   K 2.9 (L) 03/17/2017   CL 109 03/17/2017   CO2 18 (L) 03/17/2017      Goal of Therapy:  K+ 3.5 - 5.1   Plan:  KCl 3meq IV x 4, recheck 1 hour after last dose, will also order Magnesium for this time. BMP in the morning per protocol.  Caitlyn Marshall 03/17/2017,1:48 PM

## 2017-03-17 NOTE — ED Notes (Signed)
Patient transported to [facility] 

## 2017-03-17 NOTE — Progress Notes (Signed)
[name], female   DOB: 1940/04/10, 76 y.o.   MRN: [id number]  Family meeting note  Conducted in the ER. Patient's daughter and niece were present. Pt is being admitted with right-sided pneumonia/sepsis and dehydration.  Her prognosis is stable.  CODE STATUS was discussed.  Patient wishes to be a full code.  Daughter in agreement.  Total time spent 16 minutes

## 2017-03-18 DIAGNOSIS — I700 Atherosclerosis of aorta: Secondary | ICD-10-CM | POA: Insufficient documentation

## 2017-03-18 LAB — GLUCOSE, CAPILLARY
GLUCOSE-CAPILLARY: 134 mg/dL — AB (ref 65–99)
Glucose-Capillary: 142 mg/dL — ABNORMAL HIGH (ref 65–99)
Glucose-Capillary: 317 mg/dL — ABNORMAL HIGH (ref 65–99)
Glucose-Capillary: 254 mg/dL — ABNORMAL HIGH (ref 65–99)

## 2017-03-18 LAB — BASIC METABOLIC PANEL
Anion gap: 9 (ref 5–15)
BUN: 22 mg/dL — ABNORMAL HIGH (ref 6–20)
CALCIUM: 8.7 mg/dL — AB (ref 8.9–10.3)
CO2: 21 mmol/L — ABNORMAL LOW (ref 22–32)
CREATININE: 1.8 mg/dL — AB (ref 0.44–1.00)
Chloride: 111 mmol/L (ref 101–111)
GFR calc Af Amer: 31 mL/min — ABNORMAL LOW (ref >60)
GFR calc non Af Amer: 26 mL/min — ABNORMAL LOW (ref >60)
GLUCOSE: 150 mg/dL — AB (ref 65–99)
Potassium: 3.3 mmol/L — ABNORMAL LOW (ref 3.5–5.1)
Sodium: 141 mmol/L (ref 135–145)

## 2017-03-18 LAB — CBC
HCT: 33.4 % — ABNORMAL LOW (ref 35.0–47.0)
Hemoglobin: 10.6 g/dL — ABNORMAL LOW (ref 12.0–16.0)
MCH: 27.4 pg (ref 26.0–34.0)
MCHC: 31.7 g/dL — AB (ref 32.0–36.0)
MCV: 86.4 fL (ref 80.0–100.0)
PLATELETS: 160 10*3/uL (ref 150–440)
RBC: 3.86 MIL/uL (ref 3.80–5.20)
RDW: 16.6 % — ABNORMAL HIGH (ref 11.5–14.5)
WBC: 34.6 10*3/uL — ABNORMAL HIGH (ref 3.6–11.0)

## 2017-03-18 LAB — MAGNESIUM: MAGNESIUM: 2 mg/dL (ref 1.7–2.4)

## 2017-03-18 MED ORDER — POTASSIUM CHLORIDE CRYS ER 20 MEQ PO TBCR
40.0000 meq | EXTENDED_RELEASE_TABLET | Freq: Once | ORAL | Status: AC
  Administered 2017-03-18: 08:00:00 40 meq via ORAL
  Filled 2017-03-18: qty 2

## 2017-03-18 NOTE — Care Management Note (Signed)
Case Management Note  Patient Details  Name: Caitlyn Marshall MRN: [id number] Date of Birth: 1940/06/18  Subjective/Objective:                 CM consult for home health needs.  Spoke with patient.  Her son lives with her.  She is able to stay by herself while son works.  Has access to a walker but does not use.  No issues with transportation or paying for her meds.  Is current with her pcp. Is currently not requiring supplemental oxygen.  Says she is able to manage her diabetes, self inject and monitor blood sugars. Currently being treated for sepsis due to pneumonia.  Currently no services in the home.  Action/Plan:   Expected Discharge Date:                  Expected Discharge Plan:     In-House Referral:     Discharge planning Services     Post Acute Care Choice:    Choice offered to:     DME Arranged:    DME Agency:     HH Arranged:    HH Agency:     Status of Service:     If discussed at [employer] of Stay Meetings, dates discussed:    Additional Comments:  Katrina Stack, RN 03/18/2017, 9:24 AM

## 2017-03-18 NOTE — Progress Notes (Signed)
MEDICATION RELATED CONSULT NOTE - FOLLOW UP   Pharmacy Consult for electrolyte management  Indication: hypokalemia  No Known Allergies  Patient Measurements: Height: 5\' 6"  (167.6 cm) Weight: 120 lb (54.4 kg) IBW/kg (Calculated) : 59.3  Vital Signs: Temp: 99 F (37.2 C) (01/01 0459) Temp Source: Oral (01/01 0459) BP: 122/41 (01/01 0459) Pulse Rate: 89 (01/01 0459) Intake/Output from previous day: 12/31 0701 - 01/01 0700 In: 2035 [P.O.:120; I.V.:1365; IV Piggyback:550] Out: -  Intake/Output from this shift: No intake/output data recorded.  Labs: Sodium (mmol/L)  Date Value  03/18/2017 141  08/05/2014 143  05/07/2013 142   Potassium (mmol/L)  Date Value  03/18/2017 3.3 (L)  05/07/2013 4.8   Magnesium (mg/dL)  Date Value  03/18/2017 2.0  06/28/2012 1.9   Calcium (mg/dL)  Date Value  03/18/2017 8.7 (L)   Calcium, Total (mg/dL)  Date Value  05/07/2013 8.7   Albumin (g/dL)  Date Value  03/17/2017 3.9  05/04/2013 3.5      Microbiology: No results found for this or any previous visit (from the past 720 hour(s)).  Medications:  Medications Prior to Admission  Medication Sig Dispense Refill Last Dose  . allopurinol (ZYLOPRIM) 100 MG tablet TAKE 2 TABLETS BY MOUTH TWICE DAILY 360 tablet 4 Past Week at 2000  . amLODipine (NORVASC) 10 MG tablet Take 10 mg by mouth daily.   03/16/2017 at 0800  . aspirin EC 81 MG tablet Take 81 mg by mouth daily.    03/16/2017 at 0800  . hydrochlorothiazide (HYDRODIURIL) 12.5 MG tablet TK 1 T PO QAM UTD FOR LEG SWELLING  11 03/16/2017 at 0800  . insulin NPH Human (NOVOLIN N) 100 UNIT/ML injection Inject 6-7 Units into the skin 2 (two) times daily before a meal.    03/16/2017 at 1900  . insulin regular (NOVOLIN R,HUMULIN R) 100 units/mL injection Inject 5-9 Units into the skin 3 (three) times daily before meals. 5 units in AM, 7 units at lunch and 9 units in PM   03/17/2017 at 0800  . Insulin Syringe-Needle U-100 (INSULIN SYRINGE  .5CC/30GX5/16") 30G X 5/16" 0.5 ML MISC U QID UTD  5 Taking  . irbesartan (AVAPRO) 300 MG tablet Take 300 mg by mouth every evening.  8 03/16/2017 at 0800  . levothyroxine (SYNTHROID, LEVOTHROID) 50 MCG tablet Take 1 tablet by mouth daily.  5 03/16/2017 at 0730  . lovastatin (MEVACOR) 20 MG tablet TAKE 1 TABLET BY MOUTH DAILY 90 tablet 3 03/16/2017 at 0800  . montelukast (SINGULAIR) 10 MG tablet TAKE 1 TABLET BY MOUTH EVERY DAY 90 tablet 4 03/16/2017 at 0800  . ONE TOUCH ULTRA TEST test strip TEST THREE TIMES DAILY 100 each 5 Taking  . ONETOUCH DELICA LANCETS 46E MISC INJECT AS DIRECTED THREE TIMES DAILY   Taking   Scheduled:  . albuterol  2.5 mg Nebulization TID  . aspirin EC  81 mg Oral Daily  . enoxaparin (LOVENOX) injection  30 mg Subcutaneous Q24H  . insulin aspart  0-9 Units Subcutaneous TID WC  . insulin detemir  8 Units Subcutaneous BID AC & HS  . levothyroxine  50 mcg Oral QAC breakfast  . montelukast  10 mg Oral Daily  . pravastatin  10 mg Oral q1800  . senna  1 tablet Oral BID   Infusions:  . sodium chloride 100 mL/hr at 03/18/17 0705  . azithromycin    . small volume/piggyback builder     PRN: acetaminophen **OR** acetaminophen, ondansetron **OR** ondansetron (ZOFRAN) IV, polyethylene glycol  Anti-infectives (From admission, onward)   Start     Dose/Rate Route Frequency Ordered Stop   03/18/17 1300  azithromycin (ZITHROMAX) 500 mg in dextrose 5 % 250 mL IVPB     500 mg 250 mL/hr over 60 Minutes Intravenous Every 24 hours 03/17/17 1327     03/18/17 1300  cefTRIAXone (ROCEPHIN) 1 g in dextrose 5 % 50 mL IVPB - Premix  Status:  Discontinued     1 g 100 mL/hr over 30 Minutes Intravenous Every 24 hours 03/17/17 1337 03/17/17 1658   03/18/17 1300  cefTRIAXone (ROCEPHIN) 1 g in dextrose 5 % 50 mL injection      Intravenous Every 24 hours 03/17/17 1658     03/18/17 1000  azithromycin (ZITHROMAX) tablet 250 mg  Status:  Discontinued     250 mg Oral Daily 03/17/17 1338 03/17/17  1450   03/18/17 1000  cefTRIAXone (ROCEPHIN) 1 g in dextrose 5 % 50 mL injection  Status:  Discontinued      Intravenous Every 24 hours 03/17/17 1445 03/17/17 1453   03/18/17 0000  cefTRIAXone (ROCEPHIN) 1 g in dextrose 5 % 50 mL IVPB - Premix  Status:  Discontinued     1 g 100 mL/hr over 30 Minutes Intravenous Every 24 hours 03/17/17 1338 03/17/17 1444   03/17/17 1400  piperacillin-tazobactam (ZOSYN) IVPB 3.375 g  Status:  Discontinued     3.375 g 12.5 mL/hr over 240 Minutes Intravenous Every 8 hours 03/17/17 1326 03/17/17 1326   03/17/17 1400  piperacillin-tazobactam (ZOSYN) IVPB 3.375 g  Status:  Discontinued     3.375 g 12.5 mL/hr over 240 Minutes Intravenous Every 8 hours 03/17/17 1326 03/17/17 1329   03/17/17 1300  cefTRIAXone (ROCEPHIN) 1 g in dextrose 5 % 50 mL IVPB - Premix     1 g 100 mL/hr over 30 Minutes Intravenous  Once 03/17/17 1246 03/17/17 1343   03/17/17 1300  azithromycin (ZITHROMAX) 500 mg in dextrose 5 % 250 mL IVPB     500 mg 250 mL/hr over 60 Minutes Intravenous  Once 03/17/17 1246 03/17/17 1450   03/17/17 1245  piperacillin-tazobactam (ZOSYN) IVPB 3.375 g  Status:  Discontinued     3.375 g 100 mL/hr over 30 Minutes Intravenous  Once 03/17/17 1244 03/17/17 1246      Assessment: 77 year old female requiring electrolyte replacement per pharmacy protocol.    Goal of Therapy:  K+ 3.5 - 5.1   Plan:  KCl 21meq IV x 4, recheck 1 hour after last dose, will also order Magnesium for this time. BMP in the morning per protocol.  12/31 @ 20:26:  K = 3.7,  Mag = 1.4  Will order Magnesium sulfate 2 gm IV X 1 . Will recheck electrolytes on 1/1 with AM labs.   0101 @ 0943 KCl 40 meq po x 1 and f/u AM labs.   Ulice Dash D 03/18/2017,9:42 AM

## 2017-03-18 NOTE — Progress Notes (Signed)
PT Cancellation Note  Patient Details Name: Caitlyn Marshall MRN: [id number] DOB: 12-May-1940   Cancelled Treatment:    Reason Eval/Treat Not Completed: Medical issues which prohibited therapy; HR is 115 bpm; will attempt PT eval  tomorrow.   [address], [place], [place] DPT 03/18/2017, 2:12 PM

## 2017-03-18 NOTE — Progress Notes (Signed)
[facility] [place] NAME: Caitlyn Marshall    MR#:  [id number]  DATE OF BIRTH:  06-May-1940  SUBJECTIVE:   Patient better this am  REVIEW OF SYSTEMS:    Review of Systems  Constitutional: ++ for fever, NO chills weight loss HENT: Negative for ear pain, nosebleeds, congestion, facial swelling, rhinorrhea, neck pain, neck stiffness and ear discharge.   Respiratory:++ for cough, shortness of breath,No wheezing  Cardiovascular: Negative for chest pain, palpitations and leg swelling.  Gastrointestinal: Negative for heartburn, abdominal pain, vomiting, diarrhea or consitpation Genitourinary: Negative for dysuria, urgency, frequency, hematuria Musculoskeletal: Negative for back pain or joint pain Neurological: Negative for dizziness, seizures, syncope, focal weakness,  numbness and headaches.  Hematological: Does not bruise/bleed easily.  Psychiatric/Behavioral: Negative for hallucinations, confusion, dysphoric mood    Tolerating Diet:yes      DRUG ALLERGIES:  No Known Allergies  VITALS:  Blood pressure (!) 122/41, pulse 89, temperature 99 F (37.2 C), temperature source Oral, resp. rate 18, height 5\' 6"  (1.676 m), weight 54.4 kg (120 lb), SpO2 92 %.  PHYSICAL EXAMINATION:  Constitutional: Appears well-developed and well-nourished. No distress. HENT: Normocephalic. Marland Kitchen Oropharynx is clear and moist.  Eyes: Conjunctivae and EOM are normal. PERRLA, no scleral icterus.  Neck: Normal ROM. Neck supple. No JVD. No tracheal deviation. CVS: RRR, S1/S2 +, no murmurs, no gallops, no carotid bruit.  Pulmonary: right lung decreased breath sound, no stridor, rhonchi, wheezes, rales.  Abdominal: Soft. BS +,  no distension, tenderness, rebound or guarding.  Musculoskeletal: Normal range of motion. No edema and no tenderness.  Neuro: Alert. CN 2-12 grossly intact. No focal deficits. Skin: Skin is warm and dry. No rash noted. Psychiatric: Normal mood  and affect.      LABORATORY PANEL:   CBC Recent Labs  Lab 03/18/17 0528  WBC 34.6*  HGB 10.6*  HCT 33.4*  PLT 160   ------------------------------------------------------------------------------------------------------------------  Chemistries  Recent Labs  Lab 03/17/17 1132  03/18/17 0528  NA 138  --  141  K 2.9*   < > 3.3*  CL 109  --  111  CO2 18*  --  21*  GLUCOSE 370*  --  150*  BUN 23*  --  22*  CREATININE 1.95*  --  1.80*  CALCIUM 9.0  --  8.7*  MG  --    < > 2.0  AST 29  --   --   ALT 12*  --   --   ALKPHOS 103  --   --   BILITOT 1.1  --   --    < > = values in this interval not displayed.   ------------------------------------------------------------------------------------------------------------------  Cardiac Enzymes No results for input(s): TROPONINI in the last 168 hours. ------------------------------------------------------------------------------------------------------------------  RADIOLOGY:  Dg Chest 2 View  Addendum Date: 03/17/2017   ADDENDUM REPORT: 03/17/2017 12:50 ADDENDUM: The opacity in right lower lobe has a somewhat nodular configuration. It may be prudent to correlate with noncontrast enhanced chest CT to further assess in this regard. Electronically Signed   By: Lowella Grip III M.D.   On: 03/17/2017 12:50   Result Date: 03/17/2017 CLINICAL DATA:  Vomiting in the low 80s.  Hyperglycemia EXAM: CHEST  2 VIEW COMPARISON:  May 04, 2013 FINDINGS: There is airspace consolidation in the right upper lobe, right base, and lingula. Lungs elsewhere clear. Heart size and pulmonary vascularity are normal. No adenopathy. There is aortic atherosclerosis. No bone lesions. IMPRESSION: Multifocal pneumonia. Heart size  normal. No evident adenopathy. There is aortic atherosclerosis. Followup PA and lateral chest radiographs recommended in 3-4 weeks following trial of antibiotic therapy to ensure resolution and exclude underlying malignancy.  Aortic Atherosclerosis (ICD10-I70.0). Electronically Signed: By: Lowella Grip III M.D. On: 03/17/2017 12:41   Dg Abd 2 Views  Result Date: 03/17/2017 CLINICAL DATA:  Fever, vomiting, generalized illness, question sepsis history hypertension, diabetes mellitus, chronic kidney disease EXAM: ABDOMEN - 2 VIEW COMPARISON:  CT abdomen and pelvis 06/27/2012, chest radiographs 05/04/2013 FINDINGS: Diffuse interstitial prominence. Patchy infiltrate RIGHT upper lobe with mild atelectasis versus infiltrate at RIGHT base. At lower LEFT chest an area of atelectasis versus pulmonary nodules identified 24 x 18 mm. Multiple calcifications in the upper mid abdomen consistent with chronic calcific pancreatitis. Scattered atherosclerotic calcifications with prior stenting of the iliac arteries bilaterally. Tubal ligation clips in pelvis. Nonobstructive bowel gas pattern. Few nonspecific upper normal caliber loops of small bowel in the mid abdomen and pelvis. No definite bowel wall thickening or free air. Bones diffusely demineralized. IMPRESSION: Nonobstructive bowel gas pattern. Chronic calcific pancreatitis. Patchy RIGHT lung infiltrates and interstitial prominence in both lungs new since 2015 with questionable nodular density 24 x 18 mm at LEFT base; CT chest recommended to exclude pulmonary nodule. Electronically Signed   By: Lavonia Dana M.D.   On: 03/17/2017 13:00   US Abdomen Limited Ruq  Result Date: 03/17/2017 CLINICAL DATA:  Right upper quadrant pain for 1 week EXAM: ULTRASOUND ABDOMEN LIMITED RIGHT UPPER QUADRANT COMPARISON:  None. FINDINGS: Gallbladder: No gallstones or wall thickening visualized. No sonographic Murphy sign noted by sonographer. Common bile duct: Diameter: 5 mm Liver: No focal lesion identified. Within normal limits in parenchymal echogenicity. Portal vein is patent on color Doppler imaging with normal direction of blood flow towards the liver. IMPRESSION: No acute abnormality noted.  Electronically Signed   By: Inez Catalina M.D.   On: 03/17/2017 13:25     ASSESSMENT AND PLAN:   77 y.o. female with a known history of chronic kidney disease stage III, diabetes, gout, hypertension comes to the emergency room with dizziness.  1.  Sepsis secondary to right-sided pneumonia: Patient presents with elevated WBC and fever in addition to increased lactic acid levels Follow CBC in am  2. CAP: Continue Rocephin and Zithromax Follow final blood culture results Speech consult given location of PNA. Repeat CXR in 3-4 weeks  3  Diabetes Patient's last A1c was 8.1 Continue Levemir with SS/ADA diet  4.  Hypokalemia/HypoMg: Replete PRN  5. Hypothyroid: Continue Synthroid 6. HLD Continue statin     PT consult for d/c planning  Management plans discussed with the patient and she is in agreement.  CODE STATUS: FULL  TOTAL TIME TAKING CARE OF THIS PATIENT: 30 minutes.     POSSIBLE D/C 2 days, DEPENDING ON CLINICAL CONDITION.   Gurneet Matarese M.D on 03/18/2017 at 10:59 AM  Between 7am to 6pm - Pager - [phone] After 6pm go to [email or web address] - password [email or web address] [facility]  Office  [phone]  CC: Primary care physician; Birdie Sons, MD  Note: This dictation was prepared with Dragon dictation along with smaller phrase technology. Any transcriptional errors that result from this process are unintentional.

## 2017-03-19 LAB — GLUCOSE, CAPILLARY
GLUCOSE-CAPILLARY: 289 mg/dL — AB (ref 65–99)
Glucose-Capillary: 73 mg/dL (ref 65–99)

## 2017-03-19 LAB — BASIC METABOLIC PANEL
Anion gap: 7 (ref 5–15)
BUN: 22 mg/dL — ABNORMAL HIGH (ref 6–20)
CALCIUM: 8.3 mg/dL — AB (ref 8.9–10.3)
CO2: 20 mmol/L — AB (ref 22–32)
CREATININE: 1.53 mg/dL — AB (ref 0.44–1.00)
Chloride: 115 mmol/L — ABNORMAL HIGH (ref 101–111)
GFR calc Af Amer: 37 mL/min — ABNORMAL LOW (ref >60)
GFR calc non Af Amer: 32 mL/min — ABNORMAL LOW (ref >60)
GLUCOSE: 93 mg/dL (ref 65–99)
Potassium: 3.6 mmol/L (ref 3.5–5.1)
Sodium: 142 mmol/L (ref 135–145)

## 2017-03-19 LAB — CBC
HCT: 30 % — ABNORMAL LOW (ref 35.0–47.0)
Hemoglobin: 9.4 g/dL — ABNORMAL LOW (ref 12.0–16.0)
MCH: 27 pg (ref 26.0–34.0)
MCHC: 31.3 g/dL — AB (ref 32.0–36.0)
MCV: 86.2 fL (ref 80.0–100.0)
Platelets: 158 10*3/uL (ref 150–440)
RBC: 3.48 MIL/uL — ABNORMAL LOW (ref 3.80–5.20)
RDW: 16.6 % — AB (ref 11.5–14.5)
WBC: 27.1 10*3/uL — ABNORMAL HIGH (ref 3.6–11.0)

## 2017-03-19 MED ORDER — ALBUTEROL SULFATE (2.5 MG/3ML) 0.083% IN NEBU: 2.5000 mg | INHALATION_SOLUTION | Freq: Four times a day (QID) | RESPIRATORY_TRACT | Status: DC | PRN

## 2017-03-19 MED ORDER — CEFUROXIME AXETIL 500 MG PO TABS: 500.0000 mg | ORAL_TABLET | Freq: Two times a day (BID) | ORAL | 0 refills | Status: DC

## 2017-03-19 MED ORDER — MENTHOL 3 MG MT LOZG
1.0000 | LOZENGE | OROMUCOSAL | Status: DC | PRN
  Administered 2017-03-19: 13:00:00 3 mg via ORAL
  Filled 2017-03-19: qty 9

## 2017-03-19 MED ORDER — MENTHOL 3 MG MT LOZG: 1.0000 | LOZENGE | OROMUCOSAL | 12 refills | Status: DC | PRN

## 2017-03-19 MED ORDER — AZITHROMYCIN 500 MG PO TABS: 500.0000 mg | ORAL_TABLET | Freq: Every day | ORAL | 0 refills | Status: AC

## 2017-03-19 NOTE — Progress Notes (Signed)
Inpatient Diabetes Program Recommendations  AACE/ADA: New Consensus Statement on Inpatient Glycemic Control (2015)  Target Ranges:  Prepandial:   less than 140 mg/dL      Peak postprandial:   less than 180 mg/dL (1-2 hours)      Critically ill patients:  140 - 180 mg/dL   Results for ADDIE, ALONGE (MRN [id number]) as of 03/19/2017 09:20  Ref. Range 03/18/2017 07:24 03/18/2017 11:59 03/18/2017 16:27 03/18/2017 21:02  Glucose-Capillary Latest Ref Range: 65 - 99 mg/dL 142 (H) 317 (H) 254 (H) 134 (H)   Results for INDY, PRESTWOOD (MRN [id number]) as of 03/19/2017 09:20  Ref. Range 03/19/2017 07:33  Glucose-Capillary Latest Ref Range: 65 - 99 mg/dL 70   Admit with: Sepsis/ Pneumonia  History: DM, CKD  Home DM Meds: NPH 6 units AM/ 8 units PM       Regular 6 units with Breakfast/ 7 units with Lunch/ 9 units with Dinner       See visit with Dr. Gabriel Carina (Endocrinology) from 12/25/16  Current Insulin Orders: Levemir 8 units BID      Novolog Sensitive Correction Scale/ SSI (0-9 units) TID AC      MD- Please consider the following in-hospital insulin adjustments:  1. Reduce Levemir slightly to 7 units BID (CBG only 73 mg/dl this AM and patient only takes total of 14 units NPH at home)  2. Start low dose Novolog Meal Coverage: Novolog 4 units TID with meals (hold if pt eats <50% of meal)     --Will follow patient during hospitalization--  Wyn Quaker RN, MSN, CDE Diabetes Coordinator Inpatient Glycemic Control Team Team Pager: [phone] (8a-5p)

## 2017-03-19 NOTE — Evaluation (Signed)
Clinical/Bedside Swallow Evaluation Patient Details  Name: Caitlyn Marshall MRN: [id number] Date of Birth: 03/15/1941  Today's Date: 03/19/2017 Time: SLP Start Time (ACUTE ONLY): 30 SLP Stop Time (ACUTE ONLY): 1040 SLP Time Calculation (min) (ACUTE ONLY): 20 min  Past Medical History:  Past Medical History:  Diagnosis Date  . Chronic kidney disease   . Diabetes mellitus without complication ([place])   . Gout   . Hyperlipidemia   . Hypertension   . Thyroid disease    Past Surgical History:  Past Surgical History:  Procedure Laterality Date  . TUBAL LIGATION    . Vascular Stent in right leg     HPI:  77 year old female admitted 03/17/17 with vertigo, N/V, cough, not feeling well. PMH significant for CKD3, DM, gout, HTN. CXR = multifocal PNA - RUL/RLL.   Assessment / Plan / Recommendation Clinical Impression  Pt presents with adequate oral motor strength and function. Missing some dentition, but pt reports tolerance of all consistencies without difficulty swallowing. Pt exhibits no overt s/s aspiration on any consistency presented. RN also reports no difficulty with po intake or meds. Recommend continuing with current diet. Objective study may be beneficial if lungs do not clear in a timely manner, or if PNA recurs. Pt has few high risk indicators to raise suspicion for silent aspiration or dysphagia - no stroke, dementia or other progressive issue in PMH. ST will sign off at this time. Please reconsult if needs arise.  MD also informed of results/recommendations.   SLP Visit Diagnosis: Dysphagia, unspecified (R13.10)    Aspiration Risk  Mild aspiration risk    Diet Recommendation Regular;Thin liquid   Liquid Administration via: Cup;Straw Medication Administration: Whole meds with liquid Supervision: Patient able to self feed Compensations: Minimize environmental distractions;Small sips/bites;Slow rate Postural Changes: Seated upright at 90 degrees    Other  Recommendations  Oral Care Recommendations: Oral care BID   Follow up Recommendations None           Swallow Study   General Date of Onset: 03/17/17 HPI: 77 year old female admitted 03/17/17 with vertigo, N/V, cough, not feeling well. PMH significant for CKD3, DM, gout, HTN. CXR = multifocal PNA - RUL/RLL. Type of Study: Bedside Swallow Evaluation Previous Swallow Assessment: none found Diet Prior to this Study: Regular;Thin liquids Temperature Spikes Noted: No Respiratory Status: Room air History of Recent Intubation: No Behavior/Cognition: Alert;Cooperative;Pleasant mood Oral Cavity Assessment: Within Functional Limits Oral Care Completed by SLP: No Oral Cavity - Dentition: Missing dentition(partials) Vision: Functional for self-feeding Self-Feeding Abilities: Able to feed self Patient Positioning: Upright in chair Baseline Vocal Quality: Normal Volitional Cough: Strong Volitional Swallow: Able to elicit    Oral/Motor/Sensory Function Overall Oral Motor/Sensory Function: Within functional limits   Ice Chips Ice chips: Not tested   Thin Liquid Thin Liquid: Within functional limits Presentation: Straw;Self Fed    Nectar Thick Nectar Thick Liquid: Not tested   Honey Thick Honey Thick Liquid: Not tested   Puree Puree: Within functional limits Presentation: Spoon;Self Fed   Solid   GO   Solid: Within functional limits Presentation: Tonsina B. Quentin Ore, [facility], [place] Speech Language Pathologist [phone]  Colon Flattery Brown 03/19/2017,10:46 AM

## 2017-03-19 NOTE — Evaluation (Signed)
Physical Therapy Evaluation Patient Details Name: ANAHIS FURGESON MRN: [id number] DOB: 1940/09/02 Today's Date: 03/19/2017   History of Present Illness  presented to ER secondary to vomiting, dizziness and cough; admitted with sepsis related to PNA.  Clinical Impression  Upon evaluation, patient alert and oriented; follows all commands and demonstrates good insight/safety awareness.  Bilat UE/LE strength and ROM grossly symmetrical and WFL; no focal weakness, pain reported.  Demonstrates ability to complete bed mobility indep; sit/stand, basic transfers and gait (240') without assist device, mod indep.  Single R lateral LOB during initial gait efforts, but able to self correct; no other instances of LOB, buckling or safety concern noted.  Cadence decreased, but generally steady, remainder of gait trial.  Decreased ability to maintain SLS and requiring increased time for 5x sit/stand task, but patient with good awareness of higher-level deficits, good use of compensatory strategies.  Reports functional status is baseline; she feels comfortable with mobility and denies need for further therapy intervention at this time.  Initial order completed (patient at baseline, mod indep); please re-consult should needs change. Of note, mild desat to 88% with exertion, but quickly recovers >91% within 15-20 seconds with seated rest.  Did review strategies for activity pacing, energy conservation.  Patient voiced understanding.    Follow Up Recommendations No PT follow up    Equipment Recommendations       Recommendations for Other Services       Precautions / Restrictions Precautions Precautions: Fall Restrictions Weight Bearing Restrictions: No      Mobility  Bed Mobility Overal bed mobility: Independent                Transfers Overall transfer level: Modified independent Equipment used: None                Ambulation/Gait Ambulation/Gait assistance: Supervision Ambulation  Distance (Feet): 240 Feet Assistive device: None   Gait velocity: 10' walk distance, 6 seconds Gait velocity interpretation: Below normal speed for age/gender General Gait Details: single R lateral LOB during initial gait efforts (self-corrected), but smooth without buckling, LOB or safety concerns remainder of distances.  Fair cadence; minimal/no gait deviations with dynamic gait components (head turns, start/stop, gait modulation)  Stairs            Wheelchair Mobility    Modified Rankin (Stroke Patients Only)       Balance Overall balance assessment: Needs assistance Sitting-balance support: No upper extremity supported;Feet supported Sitting balance-Leahy Scale: Good     Standing balance support: No upper extremity supported Standing balance-Leahy Scale: Fair   Single Leg Stance - Right Leg: 2 Single Leg Stance - Left Leg: 2                         Pertinent Vitals/Pain Pain Assessment: No/denies pain    Home Living Family/patient expects to be discharged to:: Private residence Living Arrangements: Children(son lives with patient, works outside of home during day time) Available Help at Discharge: Family Type of Home: House Home Access: Stairs to enter Entrance Stairs-Rails: Right Entrance Stairs-Number of Steps: 5 Home Layout: One level Home Equipment: None      Prior Function Level of Independence: Independent         Comments: Indep with ADLs, household and community mobilization; no home O2; denies fall history     Hand Dominance        Extremity/Trunk Assessment   Upper Extremity Assessment Upper Extremity Assessment: Overall WFL for  tasks assessed    Lower Extremity Assessment Lower Extremity Assessment: Overall WFL for tasks assessed(grossly 4+/5 throughout)       Communication   Communication: No difficulties  Cognition Arousal/Alertness: Awake/alert Behavior During Therapy: WFL for tasks assessed/performed Overall  Cognitive Status: Within Functional Limits for tasks assessed                                        General Comments      Exercises Other Exercises Other Exercises: 5x sit/stand without assist device, no UE support, close sup: 23 seconds.  Mild increase in time required, but good LE Strenght/power noted during task.    Assessment/Plan    PT Assessment Patent does not need any further PT services  PT Problem List         PT Treatment Interventions      PT Goals (Current goals can be found in the Care Plan section)  Acute Rehab PT Goals Patient Stated Goal: to return home PT Goal Formulation: All assessment and education complete, DC therapy Time For Goal Achievement: 03/19/17 Potential to Achieve Goals: Good    Frequency     Barriers to discharge        Co-evaluation               AM-PAC PT "6 Clicks" Daily Activity  Outcome Measure Difficulty turning over in bed (including adjusting bedclothes, sheets and blankets)?: None Difficulty moving from lying on back to sitting on the side of the bed? : None Difficulty sitting down on and standing up from a chair with arms (e.g., wheelchair, bedside commode, etc,.)?: None Help needed moving to and from a bed to chair (including a wheelchair)?: None Help needed walking in hospital room?: None Help needed climbing 3-5 steps with a railing? : A Little 6 Click Score: 23    End of Session Equipment Utilized During Treatment: Gait belt Activity Tolerance: Patient tolerated treatment well Patient left: with call bell/phone within reach;in chair;with chair alarm set Nurse Communication: Mobility status PT Visit Diagnosis: Difficulty in walking, not elsewhere classified (R26.2)    Time: 5035-4656 PT Time Calculation (min) (ACUTE ONLY): 13 min   Charges:   PT Evaluation $PT Eval Low Complexity: 1 Low     PT G Codes:   PT G-Codes **NOT FOR INPATIENT CLASS** Functional Assessment Tool Used: AM-PAC 6  Clicks Basic Mobility Functional Limitation: Mobility: Walking and moving around Mobility: Walking and Moving Around Current Status (C1275): At least 1 percent but less than 20 percent impaired, limited or restricted Mobility: Walking and Moving Around Goal Status [id number]): At least 1 percent but less than 20 percent impaired, limited or restricted Mobility: Walking and Moving Around Discharge Status [id number]): At least 1 percent but less than 20 percent impaired, limited or restricted    Dyan Creelman H. Owens Shark, PT, DPT, NCS 03/19/17, 9:59 AM [phone]

## 2017-03-19 NOTE — Care Management Important Message (Signed)
Important Message  Patient Details  Name: Caitlyn Marshall MRN: [id number] Date of Birth: 1941/03/14   Medicare Important Message Given:  Yes    Shelbie Ammons, RN 03/19/2017, 7:32 AM

## 2017-03-19 NOTE — Discharge Summary (Signed)
[place] [place] NAME: Caitlyn Marshall    MR#:  [id number]  DATE OF BIRTH:  09/01/1940  DATE OF ADMISSION:  03/17/2017 ADMITTING PHYSICIAN: Fritzi Mandes, MD  DATE OF DISCHARGE: 03/19/2017  PRIMARY CARE PHYSICIAN: Birdie Sons, MD    ADMISSION DIAGNOSIS:  Lung nodule [R91.1] Abdominal pain [R10.9] Sepsis, due to unspecified organism [facility]) [A41.9] Multifocal pneumonia [J18.9] Community acquired pneumonia, unspecified laterality [J18.9]  DISCHARGE DIAGNOSIS:  Active Problems:   Sepsis ([place])   SECONDARY DIAGNOSIS:   Past Medical History:  Diagnosis Date  . Chronic kidney disease   . Diabetes mellitus without complication ([place])   . Gout   . Hyperlipidemia   . Hypertension   . Thyroid disease     HOSPITAL COURSE:   77 y.o.femalewith a known history of chronic kidney disease stage III, diabetes, gout, hypertension comes to the emergency room with dizziness.  1. Sepsis secondary to right-sided pneumonia: Patient presents with elevated WBC and fever in addition to increased lactic acid levels Blood cell is decreasing 2. CAP: Blood cultures are negative. She will be transitioned to oral Ceftin and azithromycin.  I had speech see the patient in consultation. She does not have any signs or symptoms of aspiration.  She needs repeat CXR in 3-4 weeks  3 DiabetesPatient's last A1c was 8.1 Continue Levemir with SS/ADA diet She has followed with outpatient endocrinology.   4. Hypokalemia/HypoMg: This was repleted 5. Hypothyroid: Continue Synthroid 6. HLD Continue statin 7. Acute kidney injury: This is in the setting of sepsis and has improved with IV fluids   DISCHARGE CONDITIONS AND DIET:   Able for discharge on diabetic diet  CONSULTS OBTAINED:    DRUG ALLERGIES:  No Known Allergies  DISCHARGE MEDICATIONS:   Allergies as of 03/19/2017   No Known Allergies     Medication List    TAKE these medications    allopurinol 100 MG tablet Commonly known as:  ZYLOPRIM TAKE 2 TABLETS BY MOUTH TWICE DAILY   amLODipine 10 MG tablet Commonly known as:  NORVASC Take 10 mg by mouth daily.   aspirin EC 81 MG tablet Take 81 mg by mouth daily.   azithromycin 500 MG tablet Commonly known as:  ZITHROMAX Take 1 tablet (500 mg total) by mouth daily for 6 days. Take 1 tablet daily for 6 days.   cefUROXime 500 MG tablet Commonly known as:  CEFTIN Take 1 tablet (500 mg total) by mouth 2 (two) times daily with a meal.   hydrochlorothiazide 12.5 MG tablet Commonly known as:  HYDRODIURIL TK 1 T PO QAM UTD FOR LEG SWELLING   insulin regular 100 units/mL injection Commonly known as:  NOVOLIN R,HUMULIN R Inject 5-9 Units into the skin 3 (three) times daily before meals. 5 units in AM, 7 units at lunch and 9 units in PM   INSULIN SYRINGE .5CC/30GX5/16" 30G X 5/16" 0.5 ML Misc U QID UTD   irbesartan 300 MG tablet Commonly known as:  AVAPRO Take 300 mg by mouth every evening.   levothyroxine 50 MCG tablet Commonly known as:  SYNTHROID, LEVOTHROID Take 1 tablet by mouth daily.   lovastatin 20 MG tablet Commonly known as:  MEVACOR TAKE 1 TABLET BY MOUTH DAILY   menthol-cetylpyridinium 3 MG lozenge Commonly known as:  CEPACOL Take 1 lozenge (3 mg total) by mouth as needed for sore throat.   montelukast 10 MG tablet Commonly known as:  SINGULAIR TAKE 1 TABLET BY MOUTH EVERY  DAY   NOVOLIN N 100 UNIT/ML injection Generic drug:  insulin NPH Human Inject 6-7 Units into the skin 2 (two) times daily before a meal.   ONE TOUCH ULTRA TEST test strip Generic drug:  glucose blood TEST THREE TIMES DAILY   ONETOUCH DELICA LANCETS 16X Misc INJECT AS DIRECTED THREE TIMES DAILY         Today   CHIEF COMPLAINT:  Patient doing well this morning. No acute events overnight.   VITAL SIGNS:  Blood pressure (!) 124/35, pulse 85, temperature 98.2 F (36.8 C), temperature source Oral, resp. rate 18,  height 5\' 6"  (1.676 m), weight 54.4 kg (120 lb), SpO2 93 %.   REVIEW OF SYSTEMS:  Review of Systems  Constitutional: Negative.  Negative for chills, fever and malaise/fatigue.  HENT: Negative.  Negative for ear discharge, ear pain, hearing loss, nosebleeds and sore throat.   Eyes: Negative.  Negative for blurred vision and pain.  Respiratory: Negative.  Negative for cough, hemoptysis, shortness of breath and wheezing.   Cardiovascular: Negative.  Negative for chest pain, palpitations and leg swelling.  Gastrointestinal: Negative.  Negative for abdominal pain, blood in stool, diarrhea, nausea and vomiting.  Genitourinary: Negative.  Negative for dysuria.  Musculoskeletal: Negative.  Negative for back pain.  Skin: Negative.   Neurological: Negative for dizziness, tremors, speech change, focal weakness, seizures and headaches.  Endo/Heme/Allergies: Negative.  Does not bruise/bleed easily.  Psychiatric/Behavioral: Negative.  Negative for depression, hallucinations and suicidal ideas.     PHYSICAL EXAMINATION:  GENERAL:  77 y.o.-year-old patient lying in the bed with no acute distress.  NECK:  Supple, no jugular venous distention. No thyroid enlargement, no tenderness.  LUNGS: Normal breath sounds bilaterally, no wheezing, rales,rhonchi  No use of accessory muscles of respiration.  CARDIOVASCULAR: S1, S2 normal. No murmurs, rubs, or gallops.  ABDOMEN: Soft, non-tender, non-distended. Bowel sounds present. No organomegaly or mass.  EXTREMITIES: No pedal edema, cyanosis, or clubbing.  PSYCHIATRIC: The patient is alert and oriented x 3.  SKIN: No obvious rash, lesion, or ulcer.   DATA REVIEW:   CBC Recent Labs  Lab 03/19/17 0529  WBC 27.1*  HGB 9.4*  HCT 30.0*  PLT 158    Chemistries  Recent Labs  Lab 03/17/17 1132  03/18/17 0528 03/19/17 0529  NA 138  --  141 142  K 2.9*   < > 3.3* 3.6  CL 109  --  111 115*  CO2 18*  --  21* 20*  GLUCOSE 370*  --  150* 93  BUN 23*  --   22* 22*  CREATININE 1.95*  --  1.80* 1.53*  CALCIUM 9.0  --  8.7* 8.3*  MG  --    < > 2.0  --   AST 29  --   --   --   ALT 12*  --   --   --   ALKPHOS 103  --   --   --   BILITOT 1.1  --   --   --    < > = values in this interval not displayed.    Cardiac Enzymes No results for input(s): TROPONINI in the last 168 hours.  Microbiology Results  @MICRORSLT48 @  RADIOLOGY:  Dg Chest 2 View  Addendum Date: 03/17/2017   ADDENDUM REPORT: 03/17/2017 12:50 ADDENDUM: The opacity in right lower lobe has a somewhat nodular configuration. It may be prudent to correlate with noncontrast enhanced chest CT to further assess in this regard. Electronically Signed  By: Lowella Grip III M.D.   On: 03/17/2017 12:50   Result Date: 03/17/2017 CLINICAL DATA:  Vomiting in the low 80s.  Hyperglycemia EXAM: CHEST  2 VIEW COMPARISON:  May 04, 2013 FINDINGS: There is airspace consolidation in the right upper lobe, right base, and lingula. Lungs elsewhere clear. Heart size and pulmonary vascularity are normal. No adenopathy. There is aortic atherosclerosis. No bone lesions. IMPRESSION: Multifocal pneumonia. Heart size normal. No evident adenopathy. There is aortic atherosclerosis. Followup PA and lateral chest radiographs recommended in 3-4 weeks following trial of antibiotic therapy to ensure resolution and exclude underlying malignancy. Aortic Atherosclerosis (ICD10-I70.0). Electronically Signed: By: Lowella Grip III M.D. On: 03/17/2017 12:41   Dg Abd 2 Views  Result Date: 03/17/2017 CLINICAL DATA:  Fever, vomiting, generalized illness, question sepsis history hypertension, diabetes mellitus, chronic kidney disease EXAM: ABDOMEN - 2 VIEW COMPARISON:  CT abdomen and pelvis 06/27/2012, chest radiographs 05/04/2013 FINDINGS: Diffuse interstitial prominence. Patchy infiltrate RIGHT upper lobe with mild atelectasis versus infiltrate at RIGHT base. At lower LEFT chest an area of atelectasis versus  pulmonary nodules identified 24 x 18 mm. Multiple calcifications in the upper mid abdomen consistent with chronic calcific pancreatitis. Scattered atherosclerotic calcifications with prior stenting of the iliac arteries bilaterally. Tubal ligation clips in pelvis. Nonobstructive bowel gas pattern. Few nonspecific upper normal caliber loops of small bowel in the mid abdomen and pelvis. No definite bowel wall thickening or free air. Bones diffusely demineralized. IMPRESSION: Nonobstructive bowel gas pattern. Chronic calcific pancreatitis. Patchy RIGHT lung infiltrates and interstitial prominence in both lungs new since 2015 with questionable nodular density 24 x 18 mm at LEFT base; CT chest recommended to exclude pulmonary nodule. Electronically Signed   By: Lavonia Dana M.D.   On: 03/17/2017 13:00   US Abdomen Limited Ruq  Result Date: 03/17/2017 CLINICAL DATA:  Right upper quadrant pain for 1 week EXAM: ULTRASOUND ABDOMEN LIMITED RIGHT UPPER QUADRANT COMPARISON:  None. FINDINGS: Gallbladder: No gallstones or wall thickening visualized. No sonographic Murphy sign noted by sonographer. Common bile duct: Diameter: 5 mm Liver: No focal lesion identified. Within normal limits in parenchymal echogenicity. Portal vein is patent on color Doppler imaging with normal direction of blood flow towards the liver. IMPRESSION: No acute abnormality noted. Electronically Signed   By: Inez Catalina M.D.   On: 03/17/2017 13:25      Allergies as of 03/19/2017   No Known Allergies     Medication List    TAKE these medications   allopurinol 100 MG tablet Commonly known as:  ZYLOPRIM TAKE 2 TABLETS BY MOUTH TWICE DAILY   amLODipine 10 MG tablet Commonly known as:  NORVASC Take 10 mg by mouth daily.   aspirin EC 81 MG tablet Take 81 mg by mouth daily.   azithromycin 500 MG tablet Commonly known as:  ZITHROMAX Take 1 tablet (500 mg total) by mouth daily for 6 days. Take 1 tablet daily for 6 days.   cefUROXime 500  MG tablet Commonly known as:  CEFTIN Take 1 tablet (500 mg total) by mouth 2 (two) times daily with a meal.   hydrochlorothiazide 12.5 MG tablet Commonly known as:  HYDRODIURIL TK 1 T PO QAM UTD FOR LEG SWELLING   insulin regular 100 units/mL injection Commonly known as:  NOVOLIN R,HUMULIN R Inject 5-9 Units into the skin 3 (three) times daily before meals. 5 units in AM, 7 units at lunch and 9 units in PM   INSULIN SYRINGE .5CC/30GX5/16" 30G X 5/16"  0.5 ML Misc U QID UTD   irbesartan 300 MG tablet Commonly known as:  AVAPRO Take 300 mg by mouth every evening.   levothyroxine 50 MCG tablet Commonly known as:  SYNTHROID, LEVOTHROID Take 1 tablet by mouth daily.   lovastatin 20 MG tablet Commonly known as:  MEVACOR TAKE 1 TABLET BY MOUTH DAILY   menthol-cetylpyridinium 3 MG lozenge Commonly known as:  CEPACOL Take 1 lozenge (3 mg total) by mouth as needed for sore throat.   montelukast 10 MG tablet Commonly known as:  SINGULAIR TAKE 1 TABLET BY MOUTH EVERY DAY   NOVOLIN N 100 UNIT/ML injection Generic drug:  insulin NPH Human Inject 6-7 Units into the skin 2 (two) times daily before a meal.   ONE TOUCH ULTRA TEST test strip Generic drug:  glucose blood TEST THREE TIMES DAILY   ONETOUCH DELICA LANCETS 52D Misc INJECT AS DIRECTED THREE TIMES DAILY          Management plans discussed with the patient and she is in agreement. Stable for discharge home  Patient should follow up with pcp  CODE STATUS:     Code Status Orders  (From admission, onward)        Start     Ordered   03/17/17 1339  Full code  Continuous     03/17/17 1338    Code Status History    Date Active Date Inactive Code Status Order ID Comments User Context   This patient has a current code status but no historical code status.      TOTAL TIME TAKING CARE OF THIS PATIENT: 38 minutes.    Note: This dictation was prepared with Dragon dictation along with smaller phrase technology. Any  transcriptional errors that result from this process are unintentional.  Devony Mcgrady M.D on 03/19/2017 at 10:58 AM  Between 7am to 6pm - Pager - [phone] After 6pm go to [email or web address] - password [email or web address] [facility]  Office  [phone]  CC: Primary care physician; Birdie Sons, MD

## 2017-03-19 NOTE — Progress Notes (Signed)
Discharge instructions given and went over with patient at bedside. Prescriptions reviewed. All questions answered. Patient discharged home with daughter and home health via wheelchair by volunteer services. Madlyn Frankel, RN

## 2017-03-19 NOTE — Progress Notes (Signed)
MEDICATION RELATED CONSULT NOTE - FOLLOW UP   Pharmacy Consult for electrolyte management  Indication: hypokalemia  No Known Allergies  Patient Measurements: Height: 5\' 6"  (167.6 cm) Weight: 120 lb (54.4 kg) IBW/kg (Calculated) : 59.3  Vital Signs: Temp: 98.2 F (36.8 C) (01/02 0434) Temp Source: Oral (01/02 0434) BP: 124/35 (01/02 0434) Pulse Rate: 85 (01/02 0434) Intake/Output from previous day: 01/01 0701 - 01/02 0700 In: 3240 [P.O.:960; I.V.:2030; IV Piggyback:250] Out: -  Intake/Output from this shift: No intake/output data recorded.  Labs: Sodium (mmol/L)  Date Value  03/19/2017 142  08/05/2014 143  05/07/2013 142   Potassium (mmol/L)  Date Value  03/19/2017 3.6  05/07/2013 4.8   Magnesium (mg/dL)  Date Value  03/18/2017 2.0  06/28/2012 1.9   Calcium (mg/dL)  Date Value  03/19/2017 8.3 (L)   Calcium, Total (mg/dL)  Date Value  05/07/2013 8.7   Albumin (g/dL)  Date Value  03/17/2017 3.9  05/04/2013 3.5      Microbiology: Recent Results (from the past 720 hour(s))  Blood Culture (routine x 2)     Status: None (Preliminary result)   Collection Time: 03/17/17 11:58 AM  Result Value Ref Range Status   Specimen Description BLOOD BLOOD RIGHT FOREARM  Final   Special Requests BOTTLES DRAWN AEROBIC AND ANAEROBIC [place]  Final   Culture   Final    NO GROWTH 2 DAYS Performed at [facility], [address], [place], [place] [address]    Report Status PENDING  Incomplete  Blood Culture (routine x 2)     Status: None (Preliminary result)   Collection Time: 03/17/17 11:58 AM  Result Value Ref Range Status   Specimen Description BLOOD BLOOD LEFT FOREARM  Final   Special Requests BOTTLES DRAWN AEROBIC AND ANAEROBIC BCAV  Final   Culture   Final    NO GROWTH 2 DAYS Performed at [facility], [address], [place], [place] [address]    Report Status PENDING  Incomplete    Medications:  Medications Prior to Admission   Medication Sig Dispense Refill Last Dose  . allopurinol (ZYLOPRIM) 100 MG tablet TAKE 2 TABLETS BY MOUTH TWICE DAILY 360 tablet 4 Past Week at 2000  . amLODipine (NORVASC) 10 MG tablet Take 10 mg by mouth daily.   03/16/2017 at 0800  . aspirin EC 81 MG tablet Take 81 mg by mouth daily.    03/16/2017 at 0800  . hydrochlorothiazide (HYDRODIURIL) 12.5 MG tablet TK 1 T PO QAM UTD FOR LEG SWELLING  11 03/16/2017 at 0800  . insulin NPH Human (NOVOLIN N) 100 UNIT/ML injection Inject 6-7 Units into the skin 2 (two) times daily before a meal.    03/16/2017 at 1900  . insulin regular (NOVOLIN R,HUMULIN R) 100 units/mL injection Inject 5-9 Units into the skin 3 (three) times daily before meals. 5 units in AM, 7 units at lunch and 9 units in PM   03/17/2017 at 0800  . Insulin Syringe-Needle U-100 (INSULIN SYRINGE .5CC/30GX5/16") 30G X 5/16" 0.5 ML MISC U QID UTD  5 Taking  . irbesartan (AVAPRO) 300 MG tablet Take 300 mg by mouth every evening.  8 03/16/2017 at 0800  . levothyroxine (SYNTHROID, LEVOTHROID) 50 MCG tablet Take 1 tablet by mouth daily.  5 03/16/2017 at 0730  . lovastatin (MEVACOR) 20 MG tablet TAKE 1 TABLET BY MOUTH DAILY 90 tablet 3 03/16/2017 at 0800  . montelukast (SINGULAIR) 10 MG tablet TAKE 1 TABLET BY MOUTH EVERY DAY 90 tablet  4 03/16/2017 at 0800  . ONE TOUCH ULTRA TEST test strip TEST THREE TIMES DAILY 100 each 5 Taking  . ONETOUCH DELICA LANCETS 63Z MISC INJECT AS DIRECTED THREE TIMES DAILY   Taking   Scheduled:  . albuterol  2.5 mg Nebulization TID  . aspirin EC  81 mg Oral Daily  . enoxaparin (LOVENOX) injection  30 mg Subcutaneous Q24H  . insulin aspart  0-9 Units Subcutaneous TID WC  . insulin detemir  8 Units Subcutaneous BID AC & HS  . levothyroxine  50 mcg Oral QAC breakfast  . montelukast  10 mg Oral Daily  . pravastatin  10 mg Oral q1800  . senna  1 tablet Oral BID   Infusions:  . sodium chloride 100 mL/hr at 03/18/17 2025  . azithromycin Stopped (03/18/17 1446)  .  small volume/piggyback builder Stopped (03/18/17 2312)   PRN: acetaminophen **OR** acetaminophen, ondansetron **OR** ondansetron (ZOFRAN) IV, polyethylene glycol Anti-infectives (From admission, onward)   Start     Dose/Rate Route Frequency Ordered Stop   03/18/17 1300  azithromycin (ZITHROMAX) 500 mg in dextrose 5 % 250 mL IVPB     500 mg 250 mL/hr over 60 Minutes Intravenous Every 24 hours 03/17/17 1327     03/18/17 1300  cefTRIAXone (ROCEPHIN) 1 g in dextrose 5 % 50 mL IVPB - Premix  Status:  Discontinued     1 g 100 mL/hr over 30 Minutes Intravenous Every 24 hours 03/17/17 1337 03/17/17 1658   03/18/17 1300  cefTRIAXone (ROCEPHIN) 1 g in dextrose 5 % 50 mL injection      Intravenous Every 24 hours 03/17/17 1658     03/18/17 1000  azithromycin (ZITHROMAX) tablet 250 mg  Status:  Discontinued     250 mg Oral Daily 03/17/17 1338 03/17/17 1450   03/18/17 1000  cefTRIAXone (ROCEPHIN) 1 g in dextrose 5 % 50 mL injection  Status:  Discontinued      Intravenous Every 24 hours 03/17/17 1445 03/17/17 1453   03/18/17 0000  cefTRIAXone (ROCEPHIN) 1 g in dextrose 5 % 50 mL IVPB - Premix  Status:  Discontinued     1 g 100 mL/hr over 30 Minutes Intravenous Every 24 hours 03/17/17 1338 03/17/17 1444   03/17/17 1400  piperacillin-tazobactam (ZOSYN) IVPB 3.375 g  Status:  Discontinued     3.375 g 12.5 mL/hr over 240 Minutes Intravenous Every 8 hours 03/17/17 1326 03/17/17 1326   03/17/17 1400  piperacillin-tazobactam (ZOSYN) IVPB 3.375 g  Status:  Discontinued     3.375 g 12.5 mL/hr over 240 Minutes Intravenous Every 8 hours 03/17/17 1326 03/17/17 1329   03/17/17 1300  cefTRIAXone (ROCEPHIN) 1 g in dextrose 5 % 50 mL IVPB - Premix     1 g 100 mL/hr over 30 Minutes Intravenous  Once 03/17/17 1246 03/17/17 1343   03/17/17 1300  azithromycin (ZITHROMAX) 500 mg in dextrose 5 % 250 mL IVPB     500 mg 250 mL/hr over 60 Minutes Intravenous  Once 03/17/17 1246 03/17/17 1450   03/17/17 1245   piperacillin-tazobactam (ZOSYN) IVPB 3.375 g  Status:  Discontinued     3.375 g 100 mL/hr over 30 Minutes Intravenous  Once 03/17/17 1244 03/17/17 1246      Assessment: 77 year old female requiring electrolyte replacement per pharmacy protocol.    Goal of Therapy:  K+ 3.5 - 5.1   Plan:  All labs within normal limits   Emerald Gehres D 03/19/2017,9:03 AM

## 2017-03-20 ENCOUNTER — Telehealth: Payer: Self-pay

## 2017-03-20 NOTE — Telephone Encounter (Signed)
Transition Care Management Follow-Up Telephone Call   Date discharged and where: [facility] on 03/19/17  How have you been since you were released from the hospital? Doing better, still has a slight sore throat and a cough. Denies sputum, fever or  n/v/d.  Any patient concerns? None  Items Reviewed:   Meds: verified  Allergies: verified  Dietary Changes: diabetic diet  Functional Questionnaire:  Independent-I Dependent-D  ADLs:   Dressing- I    Eating- I   Maintaining continence- I   Transferring-I   Transportation- D   Meal Prep- I   Managing Meds- I  Confirmed importance and Date/Time of follow-up visits scheduled: NONE, transferred to phone team to schedule f/u apt.    Confirmed with patient if condition worsens to call PCP or go to the Emergency Dept. Patient was given office number and encouraged to call back with questions or concerns: YES

## 2017-03-22 LAB — CULTURE, BLOOD (ROUTINE X 2)
Culture: NO GROWTH
Culture: NO GROWTH

## 2017-03-26 ENCOUNTER — Ambulatory Visit: Payer: Medicare HMO | Admitting: Family Medicine

## 2017-03-26 ENCOUNTER — Encounter: Payer: Self-pay | Admitting: Family Medicine

## 2017-03-26 VITALS — BP 132/48 | HR 74 | Temp 98.4°F | Resp 16 | Wt 115.0 lb

## 2017-03-26 DIAGNOSIS — J189 Pneumonia, unspecified organism: Secondary | ICD-10-CM | POA: Diagnosis not present

## 2017-03-26 DIAGNOSIS — E1065 Type 1 diabetes mellitus with hyperglycemia: Secondary | ICD-10-CM | POA: Diagnosis not present

## 2017-03-26 DIAGNOSIS — E039 Hypothyroidism, unspecified: Secondary | ICD-10-CM | POA: Diagnosis not present

## 2017-03-26 NOTE — Patient Instructions (Signed)
Go to the [facility] on [facility] for chest [place] on January 30th, 2019

## 2017-03-26 NOTE — Progress Notes (Signed)
Patient: Caitlyn Marshall Female    DOB: 07-02-1940   77 y.o.   MRN: [id number] Visit Date: 03/26/2017  Today's Provider: Lelon Huh, MD   Chief Complaint  Patient presents with  . Hospitalization Follow-up   Subjective:    HPI   Follow up Hospitalization  Patient was admitted to [facility] on 03/17/2017 and discharged on 03/19/2017. She was treated for Sepsis secondary to right sided pneumonia. Treatment for this included oral Ceftin and Azithromycin. Per Discharge summary patient should have repeat chest x ray in 3-4 weeks. Telephone follow up was done on 03/20/2017 She reports good compliance with treatment. She reports this condition is Improved.Still having pain in her right lower back. She has also been constipated. Constipation has improved since using rectal suppositories.   ----------------------------------------------------------------      No Known Allergies   Current Outpatient Medications:  .  allopurinol (ZYLOPRIM) 100 MG tablet, TAKE 2 TABLETS BY MOUTH TWICE DAILY, Disp: 360 tablet, Rfl: 4 .  amLODipine (NORVASC) 10 MG tablet, Take 10 mg by mouth daily., Disp: , Rfl:  .  aspirin EC 81 MG tablet, Take 81 mg by mouth daily. , Disp: , Rfl:  .  cefUROXime (CEFTIN) 500 MG tablet, Take 1 tablet (500 mg total) by mouth 2 (two) times daily with a meal., Disp: 12 tablet, Rfl: 0 .  hydrochlorothiazide (HYDRODIURIL) 12.5 MG tablet, TK 1 T PO QAM UTD FOR LEG SWELLING, Disp: , Rfl: 11 .  insulin NPH Human (NOVOLIN N) 100 UNIT/ML injection, Inject 6-7 Units into the skin 2 (two) times daily before a meal. , Disp: , Rfl:  .  insulin regular (NOVOLIN R,HUMULIN R) 100 units/mL injection, Inject 5-9 Units into the skin 3 (three) times daily before meals. 5 units in AM, 7 units at lunch and 9 units in PM, Disp: , Rfl:  .  Insulin Syringe-Needle U-100 (INSULIN SYRINGE .5CC/30GX5/16") 30G X 5/16" 0.5 ML MISC, U QID UTD, Disp: , Rfl: 5 .  irbesartan (AVAPRO) 300 MG tablet, Take  300 mg by mouth every evening., Disp: , Rfl: 8 .  levothyroxine (SYNTHROID, LEVOTHROID) 50 MCG tablet, Take 1 tablet by mouth daily., Disp: , Rfl: 5 .  lovastatin (MEVACOR) 20 MG tablet, TAKE 1 TABLET BY MOUTH DAILY, Disp: 90 tablet, Rfl: 3 .  menthol-cetylpyridinium (CEPACOL) 3 MG lozenge, Take 1 lozenge (3 mg total) by mouth as needed for sore throat., Disp: 100 tablet, Rfl: 12 .  montelukast (SINGULAIR) 10 MG tablet, TAKE 1 TABLET BY MOUTH EVERY DAY, Disp: 90 tablet, Rfl: 4 .  ONE TOUCH ULTRA TEST test strip, TEST THREE TIMES DAILY, Disp: 100 each, Rfl: 5 .  ONETOUCH DELICA LANCETS 57Q MISC, INJECT AS DIRECTED THREE TIMES DAILY, Disp: , Rfl:   Review of Systems  Constitutional: Negative for appetite change, chills, fatigue and fever.  Respiratory: Positive for shortness of breath and wheezing. Negative for cough and chest tightness.   Cardiovascular: Negative for chest pain and palpitations.  Gastrointestinal: Positive for constipation. Negative for abdominal pain, nausea and vomiting.  Musculoskeletal: Positive for back pain (lower right side).  Neurological: Negative for dizziness and weakness.    Social History   Tobacco Use  . Smoking status: Former Smoker    Packs/day: 0.50    Years: 30.00    Pack years: 15.00    Types: Cigarettes    Last attempt to quit: 03/18/2007    Years since quitting: 10.0  . Smokeless tobacco: Never Used  .  Tobacco comment: Quit smoking in 2008  Substance Use Topics  . Alcohol use: No    Alcohol/week: 0.0 oz   Objective:   BP (!) 132/48 (BP Location: Right Arm, Cuff Size: Normal)   Pulse 74   Temp 98.4 F (36.9 C) (Oral)   Resp 16   Wt 115 lb (52.2 kg)   SpO2 98% Comment: room air  BMI 18.56 kg/m  Vitals:   03/26/17 1611 03/26/17 1618  BP: (!) 140/50 (!) 132/48  Pulse: 74   Resp: 16   Temp: 98.4 F (36.9 C)   TempSrc: Oral   SpO2: 98%   Weight: 115 lb (52.2 kg)      Physical Exam   General Appearance:    Alert, cooperative, no  distress  Eyes:    PERRL, conjunctiva/corneas clear, EOM's intact       Lungs:     Clear to auscultation bilaterally, respirations unlabored  Heart:    Regular rate and rhythm  Neurologic:   Awake, alert, oriented x 3. No apparent focal neurological           defect.           Assessment & Plan:     1. Pneumonia of both lungs due to infectious organism, unspecified part of lung Clinically resolved. Recheck xray in 2-3 weeks to ensure clearing on infiltrate.        Lelon Huh, MD  [facility]

## 2017-03-31 ENCOUNTER — Other Ambulatory Visit: Payer: Self-pay | Admitting: *Deleted

## 2017-03-31 NOTE — Patient Outreach (Signed)
[place] [facility]) Care Management  03/31/2017  Caitlyn Marshall 14-Apr-1940 [id number]    Outreach attempt #1 to patient. No answer. Unable to leave a voicemail.  Plan: RN CM will contact patient within one week.  Lake Bells, RN, BSN, MHA/MSL, [address] Telephonic Care Manager Coordinator [facility] Direct Phone: [phone] Cell Phone: [phone] Toll Free: [phone] Fax: [phone]

## 2017-04-01 ENCOUNTER — Other Ambulatory Visit: Payer: Self-pay | Admitting: *Deleted

## 2017-04-01 ENCOUNTER — Ambulatory Visit: Payer: Self-pay | Admitting: *Deleted

## 2017-04-01 NOTE — Patient Outreach (Signed)
[place] [facility]) Care Management  04/01/2017  Caitlyn Marshall [birth year] [id number]  Transition of Care Referral  Referral Date: 03/28/17 Referral Source: [employer] Date of Discharge: 03/19/17 Facility: [facility] Discharge Diagnosis: CAP Insurance: [facility]  Outreach attempt # 2 to patient. No answer. Unable to leave a voicemail.  Plan: RN CM will contact patient within one week.  Lake Bells, RN, BSN, MHA/MSL, [place] Telephonic Care Manager Coordinator [facility] Direct Phone: [phone] Cell Phone: [phone] Toll Free: [phone] Fax: [phone]

## 2017-04-02 DIAGNOSIS — E039 Hypothyroidism, unspecified: Secondary | ICD-10-CM | POA: Diagnosis not present

## 2017-04-02 DIAGNOSIS — E1022 Type 1 diabetes mellitus with diabetic chronic kidney disease: Secondary | ICD-10-CM | POA: Diagnosis not present

## 2017-04-02 DIAGNOSIS — N183 Chronic kidney disease, stage 3 (moderate): Secondary | ICD-10-CM | POA: Diagnosis not present

## 2017-04-02 DIAGNOSIS — Z9289 Personal history of other medical treatment: Secondary | ICD-10-CM | POA: Diagnosis not present

## 2017-04-04 ENCOUNTER — Other Ambulatory Visit: Payer: Self-pay | Admitting: *Deleted

## 2017-04-04 ENCOUNTER — Ambulatory Visit: Payer: Self-pay | Admitting: *Deleted

## 2017-04-04 NOTE — Patient Outreach (Signed)
[place] [facility]) Care Management  04/04/2017  Caitlyn Marshall 06-06-1940 [id number]   TOC has been completed by primary care provider office who will refer to [place] Management, if needed.  Plan: RN CM will notify [facility] Case Management Assistant regarding case closure.  Lake Bells, RN, BSN, MHA/MSL, [place] Telephonic Care Manager Coordinator [facility] Direct Phone: [phone] Cell Phone: [phone] Toll Free: [phone] Fax: [phone]'

## 2017-04-08 ENCOUNTER — Telehealth: Payer: Self-pay | Admitting: Family Medicine

## 2017-04-08 DIAGNOSIS — J189 Pneumonia, unspecified organism: Secondary | ICD-10-CM

## 2017-04-08 NOTE — Telephone Encounter (Signed)
Patient was advised. X-ray ordered.

## 2017-04-08 NOTE — Telephone Encounter (Signed)
Patient is due for follow up chest xray for follow up of pneumonia. Please enter order and advise patient. Thanks.

## 2017-04-16 ENCOUNTER — Ambulatory Visit
Admission: RE | Admit: 2017-04-16 | Discharge: 2017-04-16 | Disposition: A | Discharge status: Home / Self Care | Payer: Medicare HMO | Source: Ambulatory Visit | Attending: Family Medicine | Admitting: Family Medicine

## 2017-04-16 DIAGNOSIS — J440 Chronic obstructive pulmonary disease with acute lower respiratory infection: Secondary | ICD-10-CM | POA: Diagnosis not present

## 2017-04-16 DIAGNOSIS — J849 Interstitial pulmonary disease, unspecified: Secondary | ICD-10-CM | POA: Diagnosis not present

## 2017-04-16 DIAGNOSIS — J189 Pneumonia, unspecified organism: Secondary | ICD-10-CM

## 2017-04-17 ENCOUNTER — Telehealth: Payer: Self-pay

## 2017-04-17 ENCOUNTER — Encounter: Payer: Self-pay | Admitting: Family Medicine

## 2017-04-17 ENCOUNTER — Telehealth: Payer: Self-pay | Admitting: *Deleted

## 2017-04-17 DIAGNOSIS — J984 Other disorders of lung: Secondary | ICD-10-CM | POA: Insufficient documentation

## 2017-04-17 DIAGNOSIS — R911 Solitary pulmonary nodule: Secondary | ICD-10-CM

## 2017-04-17 NOTE — Telephone Encounter (Signed)
-----   Message from Birdie Sons, MD sent at 04/17/2017  7:57 AM EST ----- Pneumonia is better, but there is a lung nodule on the left. Need CT scan with contrast for evaluation of lung nodule.

## 2017-04-17 NOTE — Telephone Encounter (Signed)
Patient was notified of results. Expressed understanding. CT scan was ordered.

## 2017-04-17 NOTE — Telephone Encounter (Signed)
Please schedule CT lung scan. Patient prefers appointments on Wednesday and Thursdays. Thanks!

## 2017-04-17 NOTE — Telephone Encounter (Signed)
Opal Sidles from [facility] radiology called to give a call report for patient chest xray that was done yesterday.   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  Persistent nodular density within lingula; CT chest with contrast recommended to exclude pulmonary neoplasm.

## 2017-04-24 ENCOUNTER — Telehealth: Payer: Self-pay | Admitting: Family Medicine

## 2017-04-24 ENCOUNTER — Other Ambulatory Visit: Payer: Self-pay | Admitting: Family Medicine

## 2017-04-24 DIAGNOSIS — R911 Solitary pulmonary nodule: Secondary | ICD-10-CM

## 2017-04-24 NOTE — Telephone Encounter (Signed)
[place] states test to f/u for pulmonary nodule is CT of chest without contrast [id number]

## 2017-04-24 NOTE — Telephone Encounter (Signed)
Order in Epic!

## 2017-05-01 ENCOUNTER — Ambulatory Visit
Admission: RE | Admit: 2017-05-01 | Discharge: 2017-05-01 | Disposition: A | Discharge status: Home / Self Care | Payer: Medicare HMO | Source: Ambulatory Visit | Attending: Family Medicine | Admitting: Family Medicine

## 2017-05-01 DIAGNOSIS — R918 Other nonspecific abnormal finding of lung field: Secondary | ICD-10-CM | POA: Diagnosis not present

## 2017-05-01 DIAGNOSIS — R911 Solitary pulmonary nodule: Secondary | ICD-10-CM | POA: Diagnosis not present

## 2017-05-01 DIAGNOSIS — J984 Other disorders of lung: Secondary | ICD-10-CM | POA: Insufficient documentation

## 2017-05-01 DIAGNOSIS — E279 Disorder of adrenal gland, unspecified: Secondary | ICD-10-CM | POA: Insufficient documentation

## 2017-05-01 DIAGNOSIS — J439 Emphysema, unspecified: Secondary | ICD-10-CM | POA: Insufficient documentation

## 2017-05-21 ENCOUNTER — Other Ambulatory Visit: Payer: Self-pay | Admitting: Family Medicine

## 2017-05-23 NOTE — Telephone Encounter (Signed)
Pharmacy requesting refills. Thanks!  

## 2017-05-28 ENCOUNTER — Other Ambulatory Visit: Payer: Self-pay | Admitting: Family Medicine

## 2017-05-28 NOTE — Telephone Encounter (Signed)
[place] faxed refill request for following medications: allopurinol (ZYLOPRIM) 100 MG tablet    Please advise,Thanks 

## 2017-05-29 ENCOUNTER — Other Ambulatory Visit: Payer: Self-pay | Admitting: Family Medicine

## 2017-05-29 DIAGNOSIS — R918 Other nonspecific abnormal finding of lung field: Secondary | ICD-10-CM

## 2017-05-29 MED ORDER — ALLOPURINOL 100 MG PO TABS: 200.0000 mg | ORAL_TABLET | Freq: Two times a day (BID) | ORAL | 0 refills | Status: DC

## 2017-05-30 ENCOUNTER — Telehealth: Payer: Self-pay

## 2017-05-30 NOTE — Telephone Encounter (Signed)
Unable to reach patient at home, phone continually rings will try and contact patient again at a later time. KW

## 2017-05-30 NOTE — Telephone Encounter (Signed)
-----   Message from Birdie Sons, MD sent at 05/29/2017 12:51 PM EDT ----- Please advise patient she really needs to see a pulmonologist about this. Have sent order to sarah to schedule.

## 2017-06-02 NOTE — Telephone Encounter (Signed)
Tried calling pt again. No answer, will try again later.

## 2017-06-04 NOTE — Telephone Encounter (Signed)
No answer  Thanks,  -Joseline 

## 2017-06-06 NOTE — Telephone Encounter (Signed)
No answer. This the 4th attempt at reaching patient. Please advise?

## 2017-06-12 ENCOUNTER — Encounter: Payer: Self-pay | Admitting: Family Medicine

## 2017-06-12 DIAGNOSIS — N183 Chronic kidney disease, stage 3 (moderate): Secondary | ICD-10-CM | POA: Diagnosis not present

## 2017-06-12 DIAGNOSIS — I129 Hypertensive chronic kidney disease with stage 1 through stage 4 chronic kidney disease, or unspecified chronic kidney disease: Secondary | ICD-10-CM | POA: Diagnosis not present

## 2017-06-12 DIAGNOSIS — R809 Proteinuria, unspecified: Secondary | ICD-10-CM | POA: Diagnosis not present

## 2017-06-12 DIAGNOSIS — E1129 Type 2 diabetes mellitus with other diabetic kidney complication: Secondary | ICD-10-CM | POA: Diagnosis not present

## 2017-06-19 ENCOUNTER — Encounter: Payer: Self-pay | Admitting: *Deleted

## 2017-07-01 ENCOUNTER — Telehealth: Payer: Self-pay | Admitting: *Deleted

## 2017-07-01 NOTE — Telephone Encounter (Signed)
Appointment scheduled with Dr Ashby Dawes 07/08/17 at 2:45.Pt advised

## 2017-07-01 NOTE — Telephone Encounter (Signed)
Concerning phone note from 05/30/2017. After several attempts to contact pt, a letter was sent to out requesting she call office. Patient called today. Patient was advised of message from 05/30/17. Patient stated she has to many appointments. Patient also stated her niece drives her to appts and she will have to discuss this with her.

## 2017-07-02 DIAGNOSIS — E1022 Type 1 diabetes mellitus with diabetic chronic kidney disease: Secondary | ICD-10-CM | POA: Diagnosis not present

## 2017-07-02 DIAGNOSIS — N183 Chronic kidney disease, stage 3 (moderate): Secondary | ICD-10-CM | POA: Diagnosis not present

## 2017-07-08 ENCOUNTER — Institutional Professional Consult (permissible substitution): Payer: Medicare HMO | Admitting: Internal Medicine

## 2017-07-09 DIAGNOSIS — Z794 Long term (current) use of insulin: Secondary | ICD-10-CM | POA: Diagnosis not present

## 2017-07-09 DIAGNOSIS — E1065 Type 1 diabetes mellitus with hyperglycemia: Secondary | ICD-10-CM | POA: Diagnosis not present

## 2017-07-09 DIAGNOSIS — E039 Hypothyroidism, unspecified: Secondary | ICD-10-CM | POA: Diagnosis not present

## 2017-07-09 DIAGNOSIS — E1022 Type 1 diabetes mellitus with diabetic chronic kidney disease: Secondary | ICD-10-CM | POA: Diagnosis not present

## 2017-07-09 DIAGNOSIS — N183 Chronic kidney disease, stage 3 (moderate): Secondary | ICD-10-CM | POA: Diagnosis not present

## 2017-08-27 ENCOUNTER — Other Ambulatory Visit: Payer: Self-pay | Admitting: Family Medicine

## 2017-09-05 ENCOUNTER — Telehealth: Payer: Self-pay

## 2017-09-05 NOTE — Telephone Encounter (Signed)
LMTCB on daughter cell. Calling to schedule pts AWV. Other 3 numbers listed on file were unavailable and I was unable to LM.  -MM

## 2017-09-10 NOTE — Telephone Encounter (Signed)
Spoke with pt about scheduling her AWV and pt stated she wanted to check and see when her other apts in July were then she would CB tomorrow to scheduled wellness.  -MM

## 2017-09-11 DIAGNOSIS — R809 Proteinuria, unspecified: Secondary | ICD-10-CM | POA: Diagnosis not present

## 2017-09-11 DIAGNOSIS — N184 Chronic kidney disease, stage 4 (severe): Secondary | ICD-10-CM | POA: Diagnosis not present

## 2017-09-11 DIAGNOSIS — E1129 Type 2 diabetes mellitus with other diabetic kidney complication: Secondary | ICD-10-CM | POA: Diagnosis not present

## 2017-09-11 DIAGNOSIS — I10 Essential (primary) hypertension: Secondary | ICD-10-CM | POA: Diagnosis not present

## 2017-09-15 NOTE — Telephone Encounter (Signed)
Scheduled for AWV on 10/08/17 @ 140 by TP. -MM

## 2017-10-08 ENCOUNTER — Ambulatory Visit (INDEPENDENT_AMBULATORY_CARE_PROVIDER_SITE_OTHER): Payer: Medicare HMO

## 2017-10-08 VITALS — BP 126/40 | HR 73 | Temp 97.6°F | Ht 66.0 in | Wt 107.8 lb

## 2017-10-08 DIAGNOSIS — N183 Chronic kidney disease, stage 3 (moderate): Secondary | ICD-10-CM | POA: Diagnosis not present

## 2017-10-08 DIAGNOSIS — E039 Hypothyroidism, unspecified: Secondary | ICD-10-CM | POA: Diagnosis not present

## 2017-10-08 DIAGNOSIS — Z0000 Encounter for general adult medical examination without abnormal findings: Secondary | ICD-10-CM | POA: Diagnosis not present

## 2017-10-08 DIAGNOSIS — E1022 Type 1 diabetes mellitus with diabetic chronic kidney disease: Secondary | ICD-10-CM | POA: Diagnosis not present

## 2017-10-08 NOTE — Progress Notes (Signed)
Subjective:   Caitlyn Marshall is a 77 y.o. female who presents for Medicare Annual (Subsequent) preventive examination.  Review of Systems:  N/A  Cardiac Risk Factors include: advanced age (>86men, >23 women);diabetes mellitus;dyslipidemia;hypertension     Objective:     Vitals: BP (!) 126/40 (BP Location: Right Arm)   Pulse 73   Temp 97.6 F (36.4 C) (Oral)   Ht 5\' 6"  (1.676 m)   Wt 107 lb 12.8 oz (48.9 kg)   BMI 17.40 kg/m   Body mass index is 17.4 kg/m.  Advanced Directives 10/08/2017 03/17/2017 03/17/2017 10/02/2016 09/27/2015 12/21/2014  Does Patient Have a Medical Advance Directive? Yes No No Yes Yes Yes  Type of Paramedic of [place];Living will - - [place];Living will Living will;Healthcare Power of Attorney Living will;Healthcare Power of Attorney  Does patient want to make changes to medical advance directive? - - - - No - Patient declined -  Copy of [place] in Chart? No - copy requested - - No - copy requested - -  Would patient like information on creating a medical advance directive? - No - Patient declined - - - -    Tobacco Social History   Tobacco Use  Smoking Status Former Smoker  . Packs/day: 0.50  . Years: 30.00  . Pack years: 15.00  . Types: Cigarettes  . Last attempt to quit: 03/18/2007  . Years since quitting: 10.5  Smokeless Tobacco Never Used  Tobacco Comment   Quit smoking in 2008     Counseling given: Not Answered Comment: Quit smoking in 2008   Clinical Intake:  Pre-visit preparation completed: Yes  Pain : No/denies pain Pain Score: 0-No pain     Nutritional Status: BMI <19  Underweight Nutritional Risks: None Diabetes: Yes(type 1) CBG done?: No Did pt. bring in CBG monitor from home?: No  How often do you need to have someone help you when you read instructions, pamphlets, or other written materials from your doctor or pharmacy?: 1 - Never  Interpreter  Needed?: No  Information entered by :: [facility], [place], LPN  Past Medical History:  Diagnosis Date  . Chronic kidney disease   . Diabetes mellitus without complication ([place])   . Gout   . Hyperlipidemia   . Hypertension   . Thyroid disease    Past Surgical History:  Procedure Laterality Date  . TUBAL LIGATION    . Vascular Stent in right leg     Family History  Problem Relation Age of Onset  . Hypertension Mother   . Diabetes Mother        Diabetes mellitus Type 2  . Alzheimer's disease Mother   . Cancer Son        died at age 33, esophagus   Social History   Socioeconomic History  . Marital status: Widowed    Spouse name: Not on file  . Number of children: 3  . Years of education: Not on file  . Highest education level: 12th grade  Occupational History  . Occupation: retired  Scientific laboratory technician  . Financial resource strain: Not hard at all  . Food insecurity:    Worry: Never true    Inability: Never true  . Transportation needs:    Medical: No    Non-medical: No  Tobacco Use  . Smoking status: Former Smoker    Packs/day: 0.50    Years: 30.00    Pack years: 15.00    Types: Cigarettes  Last attempt to quit: 03/18/2007    Years since quitting: 10.5  . Smokeless tobacco: Never Used  . Tobacco comment: Quit smoking in 2008  Substance and Sexual Activity  . Alcohol use: No    Alcohol/week: 0.0 oz  . Drug use: No  . Sexual activity: Not on file  Lifestyle  . Physical activity:    Days per week: Not on file    Minutes per session: Not on file  . Stress: Only a little  Relationships  . Social connections:    Talks on phone: Not on file    Gets together: Not on file    Attends religious service: Not on file    Active member of club or organization: Not on file    Attends meetings of clubs or organizations: Not on file    Relationship status: Not on file  Other Topics Concern  . Not on file  Social History Narrative  . Not on file    Outpatient Encounter  Medications as of 10/08/2017  Medication Sig  . allopurinol (ZYLOPRIM) 100 MG tablet Take 2 tablets (200 mg total) by mouth 2 (two) times daily.  Marland Kitchen amLODipine (NORVASC) 10 MG tablet Take 10 mg by mouth daily.  Marland Kitchen aspirin EC 81 MG tablet Take 81 mg by mouth daily.   . hydrochlorothiazide (HYDRODIURIL) 12.5 MG tablet TK 1 T PO QAM UTD FOR LEG SWELLING  . insulin NPH Human (NOVOLIN N) 100 UNIT/ML injection Inject 6-7 Units into the skin 2 (two) times daily before a meal.   . insulin regular (NOVOLIN R,HUMULIN R) 100 units/mL injection Inject 5-9 Units into the skin 3 (three) times daily before meals. 6 units in AM, 8 units at lunch and 10 units in PM  . Insulin Syringe-Needle U-100 (INSULIN SYRINGE .5CC/30GX5/16") 30G X 5/16" 0.5 ML MISC U QID UTD  . irbesartan (AVAPRO) 300 MG tablet Take 300 mg by mouth every evening.  Marland Kitchen levothyroxine (SYNTHROID, LEVOTHROID) 50 MCG tablet Take 1 tablet by mouth daily.  Marland Kitchen lovastatin (MEVACOR) 20 MG tablet TAKE 1 TABLET BY MOUTH DAILY  . montelukast (SINGULAIR) 10 MG tablet TAKE 1 TABLET BY MOUTH EVERY DAY  . ONE TOUCH ULTRA TEST test strip TEST THREE TIMES DAILY  . ONETOUCH DELICA LANCETS 19J MISC INJECT AS DIRECTED THREE TIMES DAILY  . potassium chloride SA (K-DUR,KLOR-CON) 20 MEQ tablet Take 1 tablet by mouth daily.  . cefUROXime (CEFTIN) 500 MG tablet Take 1 tablet (500 mg total) by mouth 2 (two) times daily with a meal. (Patient not taking: Reported on 10/08/2017)  . menthol-cetylpyridinium (CEPACOL) 3 MG lozenge Take 1 lozenge (3 mg total) by mouth as needed for sore throat. (Patient not taking: Reported on 10/08/2017)   No facility-administered encounter medications on file as of 10/08/2017.     Activities of Daily Living In your present state of health, do you have any difficulty performing the following activities: 10/08/2017 03/17/2017  Hearing? N N  Vision? N N  Difficulty concentrating or making decisions? N N  Walking or climbing stairs? N N  Dressing  or bathing? N N  Doing errands, shopping? N Y  Conservation officer, nature and eating ? N -  Using the Toilet? N -  In the past six months, have you accidently leaked urine? N -  Do you have problems with loss of bowel control? N -  Managing your Medications? N -  Managing your Finances? N -  Housekeeping or managing your Housekeeping? N -  Some recent  data might be hidden    Patient Care Team: Birdie Sons, MD as PCP - General (Family Medicine) [name], OD as Consulting Physician (Optometry) [name], [name], MD as Physician Assistant (Endocrinology) [name], MD as Consulting Physician (Internal Medicine)    Assessment:   This is a routine wellness examination for [place].  Exercise Activities and Dietary recommendations Current Exercise Habits: The patient does not participate in regular exercise at present, Exercise limited by: None identified  Goals    . DIET - INCREASE WATER INTAKE     Recommend increasing water intake 4 glasses a day.        Fall Risk Fall Risk  10/08/2017 10/02/2016 10/25/2015 09/27/2015 07/12/2015  Falls in the past year? No No No No No   Is the patient's home free of loose throw rugs in walkways, pet beds, electrical cords, etc?   yes      Grab bars in the bathroom? yes      Handrails on the stairs?   no      Adequate lighting?   yes  Timed Get Up and Go performed: N/A  Depression Screen PHQ 2/9 Scores 10/08/2017 10/08/2017 10/02/2016 10/02/2016  PHQ - 2 Score 0 0 0 0  PHQ- 9 Score 2 - 2 -     Cognitive Function: Pt declined screening today.      6CIT Screen 10/02/2016  What Year? 0 points  What month? 0 points  What time? 0 points  Count back from 20 0 points  Months in reverse 0 points  Repeat phrase 2 points  Total Score 2    Immunization History  Administered Date(s) Administered  . Influenza, High Dose Seasonal PF 12/22/2014, 02/02/2016, 01/08/2017  . Pneumococcal Conjugate-13 11/03/2013  . Pneumococcal Polysaccharide-23  04/15/2007, 08/07/2011    Qualifies for Shingles Vaccine? Due for Shingles vaccine. Declined my offer to administer today. Education has been provided regarding the importance of this vaccine. Pt has been advised to call her insurance company to determine her out of pocket expense. Advised she may also receive this vaccine at her local pharmacy or Health Dept. Verbalized acceptance and understanding.  Screening Tests Health Maintenance  Topic Date Due  . OPHTHALMOLOGY EXAM  01/09/2017  . COLONOSCOPY  03/18/2026 (Originally 03/12/1992)  . TETANUS/TDAP  03/18/2026 (Originally 03/12/1961)  . INFLUENZA VACCINE  10/16/2017  . HEMOGLOBIN A1C  12/14/2017  . FOOT EXAM  07/10/2018  . DEXA SCAN  Completed  . PNA vac Low Risk Adult  Completed    Cancer Screenings: Lung: Low Dose CT Chest recommended if Age 10-80 years, 30 pack-year currently smoking OR have quit w/in 15years. Patient does qualify, however is not currently due for a scan. Will not be due until 04/2018. Breast:  Up to date on Mammogram? N/A   Up to date of Bone Density/Dexa? Yes Colorectal: Pt declines today.   Additional Screenings:  Hepatitis C Screening: N/A     Plan:  I have personally reviewed and addressed the Medicare Annual Wellness questionnaire and have noted the following in the patient's chart:  A. Medical and social history B. Use of alcohol, tobacco or illicit drugs  C. Current medications and supplements D. Functional ability and status E.  Nutritional status F.  Physical activity G. Advance directives H. List of other physicians I.  Hospitalizations, surgeries, and ER visits in previous 12 months J.  [place] such as hearing and vision if needed, cognitive and depression L. Referrals and appointments -  none  In addition, I have reviewed and discussed with patient certain preventive protocols, quality metrics, and best practice recommendations. A written personalized care plan for preventive  services as well as general preventive health recommendations were provided to patient.  See attached scanned questionnaire for additional information.   Signed,  Fabio Neighbors, LPN Nurse Health Advisor   Nurse Recommendations: Pt declines the tetanus vaccine and colonoscopy referral today. Pt is planning to have her yearly eye exam completed this fall. Will contact Dr Lina Sayre office to retrieve previous eye exam notes.

## 2017-10-08 NOTE — Patient Instructions (Addendum)
Caitlyn Marshall , Thank you for taking time to come for your Medicare Wellness Visit. I appreciate your ongoing commitment to your health goals. Please review the following plan we discussed and let me know if I can assist you in the future.   Screening recommendations/referrals: Colonoscopy: Pt declines referral today. Mammogram: N/A Bone Density: Up to date Recommended yearly ophthalmology/optometry visit for glaucoma screening and checkup Recommended yearly dental visit for hygiene and checkup  Vaccinations: Influenza vaccine: Up to date Pneumococcal vaccine: Up to date Tdap vaccine: Pt declines today.  Shingles vaccine: Pt declines today.     Advanced directives: Please bring a copy of your POA (Power of Attorney) and/or Living Will to your next appointment.   Conditions/risks identified: Recommend increasing water intake 4 glasses a day.   Next appointment: 11/12/17 @ 2 PM with Dr Caryn Section.    Preventive Care 76 Years and Older, Female Preventive care refers to lifestyle choices and visits with your health care provider that can promote health and wellness. What does preventive care include?  A yearly physical exam. This is also called an annual well check.  Dental exams once or twice a year.  Routine eye exams. Ask your health care provider how often you should have your eyes checked.  Personal lifestyle choices, including:  Daily care of your teeth and gums.  Regular physical activity.  Eating a healthy diet.  Avoiding tobacco and drug use.  Limiting alcohol use.  Practicing safe sex.  Taking low-dose aspirin every day.  Taking vitamin and mineral supplements as recommended by your health care provider. What happens during an annual well check? The services and screenings done by your health care provider during your annual well check will depend on your age, overall health, lifestyle risk factors, and family history of disease. Counseling  Your health care  provider may ask you questions about your:  Alcohol use.  Tobacco use.  Drug use.  Emotional well-being.  Home and relationship well-being.  Sexual activity.  Eating habits.  History of falls.  Memory and ability to understand (cognition).  Work and work Statistician.  Reproductive health. Screening  You may have the following tests or measurements:  Height, weight, and BMI.  Blood pressure.  Lipid and cholesterol levels. These may be checked every 5 years, or more frequently if you are over 31 years old.  Skin check.  Lung cancer screening. You may have this screening every year starting at age 76 if you have a 30-pack-year history of smoking and currently smoke or have quit within the past 15 years.  Fecal occult blood test (FOBT) of the stool. You may have this test every year starting at age 76.  Flexible sigmoidoscopy or colonoscopy. You may have a sigmoidoscopy every 5 years or a colonoscopy every 10 years starting at age 76.  Hepatitis C blood test.  Hepatitis B blood test.  Sexually transmitted disease (STD) testing.  Diabetes screening. This is done by checking your blood sugar (glucose) after you have not eaten for a while (fasting). You may have this done every 1-3 years.  Bone density scan. This is done to screen for osteoporosis. You may have this done starting at age 76.  Mammogram. This may be done every 1-2 years. Talk to your health care provider about how often you should have regular mammograms. Talk with your health care provider about your test results, treatment options, and if necessary, the need for more tests. Vaccines  Your health care provider may recommend  certain vaccines, such as:  Influenza vaccine. This is recommended every year.  Tetanus, diphtheria, and acellular pertussis (Tdap, Td) vaccine. You may need a Td booster every 10 years.  Zoster vaccine. You may need this after age 23.  Pneumococcal 13-valent conjugate (PCV13)  vaccine. One dose is recommended after age 70.  Pneumococcal polysaccharide (PPSV23) vaccine. One dose is recommended after age 30. Talk to your health care provider about which screenings and vaccines you need and how often you need them. This information is not intended to replace advice given to you by your health care provider. Make sure you discuss any questions you have with your health care provider. Document Released: 03/31/2015 Document Revised: 11/22/2015 Document Reviewed: 01/03/2015 Elsevier Interactive Patient Education  2017 [place] Prevention in the Home Falls can cause injuries. They can happen to people of all ages. There are many things you can do to make your home safe and to help prevent falls. What can I do on the outside of my home?  Regularly fix the edges of walkways and driveways and fix any cracks.  Remove anything that might make you trip as you walk through a door, such as a raised step or threshold.  Trim any bushes or trees on the path to your home.  Use bright outdoor lighting.  Clear any walking paths of anything that might make someone trip, such as rocks or tools.  Regularly check to see if handrails are loose or broken. Make sure that both sides of any steps have handrails.  Any raised decks and porches should have guardrails on the edges.  Have any leaves, snow, or ice cleared regularly.  Use sand or salt on walking paths during winter.  Clean up any spills in your garage right away. This includes oil or grease spills. What can I do in the bathroom?  Use night lights.  Install grab bars by the toilet and in the tub and shower. Do not use towel bars as grab bars.  Use non-skid mats or decals in the tub or shower.  If you need to sit down in the shower, use a plastic, non-slip stool.  Keep the floor dry. Clean up any water that spills on the floor as soon as it happens.  Remove soap buildup in the tub or shower  regularly.  Attach bath mats securely with double-sided non-slip rug tape.  Do not have throw rugs and other things on the floor that can make you trip. What can I do in the bedroom?  Use night lights.  Make sure that you have a light by your bed that is easy to reach.  Do not use any sheets or blankets that are too big for your bed. They should not hang down onto the floor.  Have a firm chair that has side arms. You can use this for support while you get dressed.  Do not have throw rugs and other things on the floor that can make you trip. What can I do in the kitchen?  Clean up any spills right away.  Avoid walking on wet floors.  Keep items that you use a lot in easy-to-reach places.  If you need to reach something above you, use a strong step stool that has a grab bar.  Keep electrical cords out of the way.  Do not use floor polish or wax that makes floors slippery. If you must use wax, use non-skid floor wax.  Do not have throw rugs and other  things on the floor that can make you trip. What can I do with my stairs?  Do not leave any items on the stairs.  Make sure that there are handrails on both sides of the stairs and use them. Fix handrails that are broken or loose. Make sure that handrails are as long as the stairways.  Check any carpeting to make sure that it is firmly attached to the stairs. Fix any carpet that is loose or worn.  Avoid having throw rugs at the top or bottom of the stairs. If you do have throw rugs, attach them to the floor with carpet tape.  Make sure that you have a light switch at the top of the stairs and the bottom of the stairs. If you do not have them, ask someone to add them for you. What else can I do to help prevent falls?  Wear shoes that:  Do not have high heels.  Have rubber bottoms.  Are comfortable and fit you well.  Are closed at the toe. Do not wear sandals.  If you use a stepladder:  Make sure that it is fully  opened. Do not climb a closed stepladder.  Make sure that both sides of the stepladder are locked into place.  Ask someone to hold it for you, if possible.  Clearly mark and make sure that you can see:  Any grab bars or handrails.  First and last steps.  Where the edge of each step is.  Use tools that help you move around (mobility aids) if they are needed. These include:  Canes.  Walkers.  Scooters.  Crutches.  Turn on the lights when you go into a dark area. Replace any light bulbs as soon as they burn out.  Set up your furniture so you have a clear path. Avoid moving your furniture around.  If any of your floors are uneven, fix them.  If there are any pets around you, be aware of where they are.  Review your medicines with your doctor. Some medicines can make you feel dizzy. This can increase your chance of falling. Ask your doctor what other things that you can do to help prevent falls. This information is not intended to replace advice given to you by your health care provider. Make sure you discuss any questions you have with your health care provider. Document Released: 12/29/2008 Document Revised: 08/10/2015 Document Reviewed: 04/08/2014 Elsevier Interactive Patient Education  2017 [employer].

## 2017-10-15 DIAGNOSIS — Z794 Long term (current) use of insulin: Secondary | ICD-10-CM | POA: Diagnosis not present

## 2017-10-15 DIAGNOSIS — E039 Hypothyroidism, unspecified: Secondary | ICD-10-CM | POA: Diagnosis not present

## 2017-10-15 DIAGNOSIS — N183 Chronic kidney disease, stage 3 (moderate): Secondary | ICD-10-CM | POA: Diagnosis not present

## 2017-10-15 DIAGNOSIS — E1022 Type 1 diabetes mellitus with diabetic chronic kidney disease: Secondary | ICD-10-CM | POA: Diagnosis not present

## 2017-10-15 DIAGNOSIS — E1065 Type 1 diabetes mellitus with hyperglycemia: Secondary | ICD-10-CM | POA: Diagnosis not present

## 2017-11-12 ENCOUNTER — Encounter: Payer: Self-pay | Admitting: Family Medicine

## 2017-11-12 ENCOUNTER — Ambulatory Visit (INDEPENDENT_AMBULATORY_CARE_PROVIDER_SITE_OTHER): Payer: Medicare HMO | Admitting: Family Medicine

## 2017-11-12 VITALS — BP 138/46 | HR 75 | Temp 97.6°F | Resp 16 | Ht 66.0 in | Wt 107.8 lb

## 2017-11-12 DIAGNOSIS — R5383 Other fatigue: Secondary | ICD-10-CM

## 2017-11-12 DIAGNOSIS — M1000 Idiopathic gout, unspecified site: Secondary | ICD-10-CM

## 2017-11-12 DIAGNOSIS — E039 Hypothyroidism, unspecified: Secondary | ICD-10-CM | POA: Diagnosis not present

## 2017-11-12 DIAGNOSIS — E559 Vitamin D deficiency, unspecified: Secondary | ICD-10-CM | POA: Diagnosis not present

## 2017-11-12 DIAGNOSIS — R911 Solitary pulmonary nodule: Secondary | ICD-10-CM | POA: Diagnosis not present

## 2017-11-12 DIAGNOSIS — E1029 Type 1 diabetes mellitus with other diabetic kidney complication: Secondary | ICD-10-CM | POA: Diagnosis not present

## 2017-11-12 DIAGNOSIS — I739 Peripheral vascular disease, unspecified: Secondary | ICD-10-CM | POA: Diagnosis not present

## 2017-11-12 DIAGNOSIS — Z0000 Encounter for general adult medical examination without abnormal findings: Secondary | ICD-10-CM

## 2017-11-12 LAB — POCT UA - MICROALBUMIN: Microalbumin Ur, POC: 20 mg/L

## 2017-11-12 NOTE — Progress Notes (Signed)
Patient: Caitlyn Marshall, Female    DOB: 09-Feb-1941, 77 y.o.   MRN: [id number] Visit Date: 11/12/2017  Today's Provider: Lelon Huh, MD   Chief Complaint  Patient presents with  . Annual Exam  . Diabetes  . Hypertension   Subjective:   Patient saw McKenzie for AWV on 10/08/2017.   Complete Physical DORALENE GLANZ is a 77 y.o. female. She feels well. She reports exercising none. She reports she is sleeping well. She does report decreased appetite and occasional nausea. Complains she is easily fatigued, but no coughing or dyspnea. She continues to see Dr. Gabriel Carina for diabetes which is not controlled and patient is not concerned about.    Wt Readings from Last 3 Encounters:  11/12/17 107 lb 12.8 oz (48.9 kg)  10/08/17 107 lb 12.8 oz (48.9 kg)  03/26/17 115 lb (52.2 kg)    -----------------------------------------------------------   Diabetes Mellitus Type II, Follow-up:   Lab Results  Component Value Date   HGBA1C 8.1 12/25/2016   HGBA1C 9.1 09/04/2016   HGBA1C SEE COMMENT 05/05/2013   Last seen for diabetes 10/02/2016.  Management since then includes; no changes. She reports good compliance with treatment. She is not having side effects. none Current symptoms include none and have been unchanged. Home blood sugar records: fasting range: 114  Episodes of hypoglycemia? no   Current Insulin Regimen: Novolin and Novolin R Most Recent Eye Exam: past due Weight trend: stable Prior visit with dietician: no Current diet: in general, an "unhealthy" diet Current exercise: none  ---------------------------------------------------------------    Hypertension, follow-up:  BP Readings from Last 3 Encounters:  11/12/17 (!) 138/46  10/08/17 (!) 126/40  03/26/17 (!) 132/48    She was last seen for hypertension 10/02/2016.  BP at that visit was . Management since that visit includes; no changes. Continue routine follow up with Dr. Candiss Norse for CKD.She reports  good compliance with treatment. She is not having side effects. none She is not exercising. She is not adherent to low salt diet.   Outside blood pressures are normal. She is experiencing none.  Patient denies none.   Cardiovascular risk factors include diabetes mellitus.  Use of agents associated with hypertension: none.   ---------------------------------------------------------------  Lung mass Referred to pulmonology. Appt was made to see Dr. Ashby Dawes on 07/08/2017 but patient did not show and refused to reschedule.    Review of Systems  Constitutional: Negative for chills, diaphoresis and fever.  HENT: Positive for sneezing. Negative for congestion, ear discharge, ear pain, hearing loss, nosebleeds, sore throat and tinnitus.   Eyes: Positive for itching. Negative for photophobia, pain, discharge and redness.  Respiratory: Negative for cough, shortness of breath, wheezing and stridor.   Cardiovascular: Negative for chest pain, palpitations and leg swelling.  Gastrointestinal: Negative for abdominal pain, blood in stool, constipation, diarrhea, nausea and vomiting.  Endocrine: Negative for polydipsia.  Genitourinary: Negative for dysuria, flank pain, frequency, hematuria and urgency.  Musculoskeletal: Negative for back pain, myalgias and neck pain.  Skin: Negative for rash.  Allergic/Immunologic: Negative for environmental allergies.  Neurological: Negative for dizziness, tremors, seizures, weakness and headaches.  Hematological: Does not bruise/bleed easily.  Psychiatric/Behavioral: Negative for hallucinations and suicidal ideas. The patient is not nervous/anxious.     Social History   Socioeconomic History  . Marital status: Widowed    Spouse name: Not on file  . Number of children: 3  . Years of education: Not on file  . Highest education level: 12th  grade  Occupational History  . Occupation: retired  Scientific laboratory technician  . Financial resource strain: Not hard at all  .  Food insecurity:    Worry: Never true    Inability: Never true  . Transportation needs:    Medical: No    Non-medical: No  Tobacco Use  . Smoking status: Former Smoker    Packs/day: 0.50    Years: 30.00    Pack years: 15.00    Types: Cigarettes    Last attempt to quit: 03/18/2007    Years since quitting: 10.6  . Smokeless tobacco: Never Used  . Tobacco comment: Quit smoking in 2008  Substance and Sexual Activity  . Alcohol use: No    Alcohol/week: 0.0 standard drinks  . Drug use: No  . Sexual activity: Not on file  Lifestyle  . Physical activity:    Days per week: Not on file    Minutes per session: Not on file  . Stress: Only a little  Relationships  . Social connections:    Talks on phone: Not on file    Gets together: Not on file    Attends religious service: Not on file    Active member of club or organization: Not on file    Attends meetings of clubs or organizations: Not on file    Relationship status: Not on file  . Intimate partner violence:    Fear of current or ex partner: Not on file    Emotionally abused: Not on file    Physically abused: Not on file    Forced sexual activity: Not on file  Other Topics Concern  . Not on file  Social History Narrative  . Not on file    Past Medical History:  Diagnosis Date  . Chronic kidney disease   . Diabetes mellitus without complication ([place])   . Gout   . Hyperlipidemia   . Hypertension   . Thyroid disease      Patient Active Problem List   Diagnosis Date Noted  . Nodule of left lung 04/17/2017  . Sepsis ([place]) 03/17/2017  . Insomnia 07/12/2015  . Hypertension 07/12/2015  . Poor appetite 07/12/2015  . Constipation 07/12/2015  . Bursitis 05/31/2015  . 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 Leg swelling 05/31/2015  . Allergic rhinitis 12/26/2014  . Vitamin D deficiency 12/20/2014  . Anxiety 12/20/2014  . Hypercholesterolemia 12/20/2014  . Anemia 12/20/2014    . Hypothyroidism 12/20/2014  . Hypertensive CKD (chronic kidney disease) 12/20/2014  . Premature ventricular contraction 12/20/2014  . PVD (peripheral vascular disease) ([place]) 12/20/2014  . Gout 12/20/2014  . Osteoarthritis 12/20/2014  . Renal insufficiency 12/20/2014  . Chronic kidney disease (CKD), stage III (moderate) ([place]) 12/20/2014    Past Surgical History:  Procedure Laterality Date  . TUBAL LIGATION    . Vascular Stent in right leg      Her family history includes Alzheimer's disease in her mother; Cancer in her son; Diabetes in her mother; Hypertension in her mother.      Current Outpatient Medications:  .  allopurinol (ZYLOPRIM) 100 MG tablet, Take 2 tablets (200 mg total) by mouth 2 (two) times daily., Disp: 360 tablet, Rfl: 0 .  amLODipine (NORVASC) 10 MG tablet, Take 10 mg by mouth daily., Disp: , Rfl:  .  aspirin EC 81 MG tablet, Take 81 mg by mouth daily. , Disp: , Rfl:  .  hydrochlorothiazide (HYDRODIURIL) 12.5 MG tablet, TK 1 T PO  QAM UTD FOR LEG SWELLING, Disp: , Rfl: 11 .  insulin NPH Human (NOVOLIN N) 100 UNIT/ML injection, Inject 6-7 Units into the skin 2 (two) times daily before a meal. , Disp: , Rfl:  .  insulin regular (NOVOLIN R,HUMULIN R) 100 units/mL injection, Inject 5-9 Units into the skin 3 (three) times daily before meals. 6 units in AM, 8 units at lunch and 10 units in PM, Disp: , Rfl:  .  Insulin Syringe-Needle U-100 (INSULIN SYRINGE .5CC/30GX5/16") 30G X 5/16" 0.5 ML MISC, U QID UTD, Disp: , Rfl: 5 .  irbesartan (AVAPRO) 300 MG tablet, Take 300 mg by mouth every evening., Disp: , Rfl: 8 .  levothyroxine (SYNTHROID, LEVOTHROID) 50 MCG tablet, Take 1 tablet by mouth daily., Disp: , Rfl: 5 .  lovastatin (MEVACOR) 20 MG tablet, TAKE 1 TABLET BY MOUTH DAILY, Disp: 90 tablet, Rfl: 3 .  montelukast (SINGULAIR) 10 MG tablet, TAKE 1 TABLET BY MOUTH EVERY DAY, Disp: 90 tablet, Rfl: 4 .  ONE TOUCH ULTRA TEST test strip, TEST THREE TIMES DAILY, Disp: 100 each,  Rfl: 5 .  ONETOUCH DELICA LANCETS 88C MISC, INJECT AS DIRECTED THREE TIMES DAILY, Disp: , Rfl:  .  potassium chloride SA (K-DUR,KLOR-CON) 20 MEQ tablet, Take 1 tablet by mouth daily., Disp: , Rfl: 2 .  cefUROXime (CEFTIN) 500 MG tablet, Take 1 tablet (500 mg total) by mouth 2 (two) times daily with a meal. (Patient not taking: Reported on 11/12/2017), Disp: 12 tablet, Rfl: 0 .  menthol-cetylpyridinium (CEPACOL) 3 MG lozenge, Take 1 lozenge (3 mg total) by mouth as needed for sore throat. (Patient not taking: Reported on 11/12/2017), Disp: 100 tablet, Rfl: 12  Patient Care Team: Birdie Sons, MD as PCP - General (Family Medicine) [name], OD as Consulting Physician (Optometry) [name], [name], MD as Physician Assistant (Endocrinology) [name], MD as Consulting Physician (Internal Medicine)     Objective:   Vitals: BP (!) 138/46 (BP Location: Right Arm, Patient Position: Sitting, Cuff Size: Small)   Pulse 75   Temp 97.6 F (36.4 C) (Oral)   Resp 16   Ht 5\' 6"  (1.676 m)   Wt 107 lb 12.8 oz (48.9 kg)   SpO2 99%   BMI 17.40 kg/m   Physical Exam   General Appearance:    Alert, cooperative, no distress, appears stated age  Head:    Normocephalic, without obvious abnormality, atraumatic  Eyes:    PERRL, conjunctiva/corneas clear, EOM's intact, fundi    benign, both eyes  Ears:    Normal TM's and external ear canals, both ears  Nose:   Nares normal, septum midline, mucosa normal, no drainage    or sinus tenderness  Throat:   Lips, mucosa, and tongue normal; teeth and gums normal  Neck:   Supple, symmetrical, trachea midline, no adenopathy;    thyroid:  no enlargement/tenderness/nodules; no carotid   bruit or JVD  Back:     Symmetric, no curvature, ROM normal, no CVA tenderness  Lungs:     Clear to auscultation bilaterally, respirations unlabored  Chest Wall:    No tenderness or deformity   Heart:    Regular rate and rhythm, S1 and S2 normal, no murmur, rub   or gallop    Breast Exam:    patient refused  Abdomen:     Soft, non-tender, bowel sounds active all four quadrants,    no masses, no organomegaly  Pelvic:    deferred  Extremities:   Extremities  normal, atraumatic, no cyanosis or edema  Pulses:   2+ and symmetric all extremities  Skin:   Skin color, texture, turgor normal, no rashes or lesions  Lymph nodes:   Cervical, supraclavicular, and axillary nodes normal  Neurologic:   CNII-XII intact, normal strength, sensation and reflexes    throughout    Activities of Daily Living In your present state of health, do you have any difficulty performing the following activities: 10/08/2017 03/17/2017  Hearing? N N  Vision? N N  Difficulty concentrating or making decisions? N N  Walking or climbing stairs? N N  Dressing or bathing? N N  Doing errands, shopping? N Y  Conservation officer, nature and eating ? N -  Using the Toilet? N -  In the past six months, have you accidently leaked urine? N -  Do you have problems with loss of bowel control? N -  Managing your Medications? N -  Managing your Finances? N -  Housekeeping or managing your Housekeeping? N -  Some recent data might be hidden    Fall Risk Assessment Fall Risk  10/08/2017 10/02/2016 10/25/2015 09/27/2015 07/12/2015  Falls in the past year? No No No No No     Depression Screen PHQ 2/9 Scores 10/08/2017 10/08/2017 10/02/2016 10/02/2016  PHQ - 2 Score 0 0 0 0  PHQ- 9 Score 2 - 2 -     Assessment & Plan:    Annual Physical Reviewed patient's Family Medical History Reviewed and updated list of patient's medical providers Assessment of cognitive impairment was done Assessed patient's functional ability Established a written schedule for health screening [place] Completed and Reviewed  Exercise Activities and Dietary recommendations Goals    . DIET - INCREASE WATER INTAKE     Recommend increasing water intake 4 glasses a day.     . Increase water intake     Recommend  increasing water intake to 4-6 glasses a day.        Immunization History  Administered Date(s) Administered  . Influenza, High Dose Seasonal PF 12/22/2014, 02/02/2016, 01/08/2017  . Pneumococcal Conjugate-13 11/03/2013  . Pneumococcal Polysaccharide-23 04/15/2007, 08/07/2011    Health Maintenance  Topic Date Due  . OPHTHALMOLOGY EXAM  01/09/2017  . INFLUENZA VACCINE  10/16/2017  . COLONOSCOPY  03/18/2026 (Originally 03/12/1992)  . TETANUS/TDAP  03/18/2026 (Originally 03/12/1961)  . HEMOGLOBIN A1C  12/14/2017  . FOOT EXAM  07/10/2018  . DEXA SCAN  Completed  . PNA vac Low Risk Adult  Completed     Discussed health benefits of physical activity, and encouraged her to engage in regular exercise appropriate for her age and condition.    ------------------------------------------------------------------------------------------------------------  1. Annual physical exam   2. 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 Gabriel Carina  - POCT UA - Microalbumin  3. PVD (peripheral vascular disease) ([place])   4. Hypothyroidism, unspecified type  - TSH  5. Nodule of left lung Patient has refused referral to pulmonary for evaluation. She was extensively counseled today that findings are likely cancerous and that she will die from it if it is cancerous and is not treated. She states that the lord will take care of here and she is not worried about. Advised that her lack of appetite and weight loss may be related to cancerous tumor which she discounted. She states she will think about it.   6. Other fatigue  - Comprehensive metabolic panel - CBC - TSH  7. Vitamin D deficiency  -  VITAMIN D 25 Hydroxy (Vit-D Deficiency, Fractures)  8. Idiopathic gout, unspecified chronicity, unspecified site She would like to cut back on some of her medications. Consider reducing allopurinol.  - Uric acid    Lelon Huh, MD  [facility]

## 2017-11-12 NOTE — Patient Instructions (Signed)
   You have a nodule on your lung that is probably a tumor or early lung cancer. You need to get a PET lung scan to see if it is cancerous or not. If it is cancerous, you will die if is not treated.

## 2017-11-13 ENCOUNTER — Other Ambulatory Visit: Payer: Self-pay | Admitting: Family Medicine

## 2017-11-13 DIAGNOSIS — R911 Solitary pulmonary nodule: Secondary | ICD-10-CM

## 2017-11-13 LAB — COMPREHENSIVE METABOLIC PANEL
ALBUMIN: 4.3 g/dL (ref 3.5–4.8)
ALK PHOS: 132 IU/L — AB (ref 39–117)
ALT: 12 IU/L (ref 0–32)
AST: 14 IU/L (ref 0–40)
Albumin/Globulin Ratio: 1.8 (ref 1.2–2.2)
BILIRUBIN TOTAL: 0.4 mg/dL (ref 0.0–1.2)
BUN / CREAT RATIO: 13 (ref 12–28)
BUN: 28 mg/dL — AB (ref 8–27)
CHLORIDE: 108 mmol/L — AB (ref 96–106)
CO2: 16 mmol/L — ABNORMAL LOW (ref 20–29)
Calcium: 8.9 mg/dL (ref 8.7–10.3)
Creatinine, Ser: 2.12 mg/dL — ABNORMAL HIGH (ref 0.57–1.00)
GFR calc Af Amer: 26 mL/min/{1.73_m2} — ABNORMAL LOW (ref >59)
GFR calc non Af Amer: 22 mL/min/{1.73_m2} — ABNORMAL LOW (ref >59)
GLOBULIN, TOTAL: 2.4 g/dL (ref 1.5–4.5)
Glucose: 280 mg/dL — ABNORMAL HIGH (ref 65–99)
POTASSIUM: 4.1 mmol/L (ref 3.5–5.2)
Sodium: 144 mmol/L (ref 134–144)
TOTAL PROTEIN: 6.7 g/dL (ref 6.0–8.5)

## 2017-11-13 LAB — CBC
HEMATOCRIT: 34.3 % (ref 34.0–46.6)
Hemoglobin: 10.7 g/dL — ABNORMAL LOW (ref 11.1–15.9)
MCH: 27.2 pg (ref 26.6–33.0)
MCHC: 31.2 g/dL — ABNORMAL LOW (ref 31.5–35.7)
MCV: 87 fL (ref 79–97)
PLATELETS: 256 10*3/uL (ref 150–450)
RBC: 3.94 x10E6/uL (ref 3.77–5.28)
RDW: 15 % (ref 12.3–15.4)
WBC: 10.7 10*3/uL (ref 3.4–10.8)

## 2017-11-13 LAB — URIC ACID: URIC ACID: 5.5 mg/dL (ref 2.5–7.1)

## 2017-11-13 LAB — TSH: TSH: 1.85 u[IU]/mL (ref 0.450–4.500)

## 2017-11-13 LAB — VITAMIN D 25 HYDROXY (VIT D DEFICIENCY, FRACTURES): VIT D 25 HYDROXY: 8.2 ng/mL — AB (ref 30.0–100.0)

## 2017-11-18 ENCOUNTER — Other Ambulatory Visit: Payer: Self-pay | Admitting: Family Medicine

## 2017-12-17 DIAGNOSIS — E119 Type 2 diabetes mellitus without complications: Secondary | ICD-10-CM | POA: Diagnosis not present

## 2017-12-17 DIAGNOSIS — Z7984 Long term (current) use of oral hypoglycemic drugs: Secondary | ICD-10-CM | POA: Diagnosis not present

## 2017-12-17 DIAGNOSIS — H2513 Age-related nuclear cataract, bilateral: Secondary | ICD-10-CM | POA: Diagnosis not present

## 2017-12-19 DIAGNOSIS — Z23 Encounter for immunization: Secondary | ICD-10-CM | POA: Diagnosis not present

## 2017-12-25 DIAGNOSIS — N184 Chronic kidney disease, stage 4 (severe): Secondary | ICD-10-CM | POA: Diagnosis not present

## 2017-12-25 DIAGNOSIS — E1129 Type 2 diabetes mellitus with other diabetic kidney complication: Secondary | ICD-10-CM | POA: Diagnosis not present

## 2017-12-25 DIAGNOSIS — I10 Essential (primary) hypertension: Secondary | ICD-10-CM | POA: Diagnosis not present

## 2017-12-25 DIAGNOSIS — R809 Proteinuria, unspecified: Secondary | ICD-10-CM | POA: Diagnosis not present

## 2018-01-21 DIAGNOSIS — E1022 Type 1 diabetes mellitus with diabetic chronic kidney disease: Secondary | ICD-10-CM | POA: Diagnosis not present

## 2018-01-21 DIAGNOSIS — E039 Hypothyroidism, unspecified: Secondary | ICD-10-CM | POA: Diagnosis not present

## 2018-01-21 DIAGNOSIS — N183 Chronic kidney disease, stage 3 (moderate): Secondary | ICD-10-CM | POA: Diagnosis not present

## 2018-01-21 DIAGNOSIS — Z794 Long term (current) use of insulin: Secondary | ICD-10-CM | POA: Diagnosis not present

## 2018-02-19 DIAGNOSIS — E113293 Type 2 diabetes mellitus with mild nonproliferative diabetic retinopathy without macular edema, bilateral: Secondary | ICD-10-CM | POA: Diagnosis not present

## 2018-02-19 LAB — HM DIABETES EYE EXAM

## 2018-02-23 ENCOUNTER — Encounter: Payer: Self-pay | Admitting: Family Medicine

## 2018-02-23 DIAGNOSIS — E11319 Type 2 diabetes mellitus with unspecified diabetic retinopathy without macular edema: Secondary | ICD-10-CM | POA: Insufficient documentation

## 2018-03-06 ENCOUNTER — Other Ambulatory Visit: Payer: Self-pay | Admitting: Family Medicine

## 2018-03-06 DIAGNOSIS — J309 Allergic rhinitis, unspecified: Secondary | ICD-10-CM

## 2018-03-06 DIAGNOSIS — E7800 Pure hypercholesterolemia, unspecified: Secondary | ICD-10-CM

## 2018-04-22 DIAGNOSIS — E039 Hypothyroidism, unspecified: Secondary | ICD-10-CM | POA: Diagnosis not present

## 2018-04-22 DIAGNOSIS — N183 Chronic kidney disease, stage 3 (moderate): Secondary | ICD-10-CM | POA: Diagnosis not present

## 2018-04-22 DIAGNOSIS — E1022 Type 1 diabetes mellitus with diabetic chronic kidney disease: Secondary | ICD-10-CM | POA: Diagnosis not present

## 2018-04-29 DIAGNOSIS — E1022 Type 1 diabetes mellitus with diabetic chronic kidney disease: Secondary | ICD-10-CM | POA: Diagnosis not present

## 2018-04-29 DIAGNOSIS — E039 Hypothyroidism, unspecified: Secondary | ICD-10-CM | POA: Diagnosis not present

## 2018-04-29 DIAGNOSIS — Z794 Long term (current) use of insulin: Secondary | ICD-10-CM | POA: Diagnosis not present

## 2018-04-29 DIAGNOSIS — N183 Chronic kidney disease, stage 3 (moderate): Secondary | ICD-10-CM | POA: Diagnosis not present

## 2018-05-04 DIAGNOSIS — E1159 Type 2 diabetes mellitus with other circulatory complications: Secondary | ICD-10-CM | POA: Diagnosis not present

## 2018-05-14 DIAGNOSIS — I129 Hypertensive chronic kidney disease with stage 1 through stage 4 chronic kidney disease, or unspecified chronic kidney disease: Secondary | ICD-10-CM | POA: Diagnosis not present

## 2018-05-14 DIAGNOSIS — E1129 Type 2 diabetes mellitus with other diabetic kidney complication: Secondary | ICD-10-CM | POA: Diagnosis not present

## 2018-05-14 DIAGNOSIS — N184 Chronic kidney disease, stage 4 (severe): Secondary | ICD-10-CM | POA: Diagnosis not present

## 2018-05-18 ENCOUNTER — Other Ambulatory Visit: Payer: Self-pay

## 2018-05-18 ENCOUNTER — Encounter: Payer: Self-pay | Admitting: *Deleted

## 2018-05-21 NOTE — Discharge Instructions (Signed)

## 2018-05-26 ENCOUNTER — Encounter
Admission: RE | Disposition: A | Discharge status: Home / Self Care | Payer: Self-pay | Source: Home / Self Care | Attending: Ophthalmology

## 2018-05-26 ENCOUNTER — Ambulatory Visit
Admission: RE | Admit: 2018-05-26 | Discharge: 2018-05-26 | Disposition: A | Discharge status: Home / Self Care | Payer: Medicare HMO | Attending: Ophthalmology | Admitting: Ophthalmology

## 2018-05-26 ENCOUNTER — Ambulatory Visit: Payer: Medicare HMO | Admitting: Anesthesiology

## 2018-05-26 DIAGNOSIS — H25812 Combined forms of age-related cataract, left eye: Secondary | ICD-10-CM | POA: Diagnosis not present

## 2018-05-26 DIAGNOSIS — E7800 Pure hypercholesterolemia, unspecified: Secondary | ICD-10-CM | POA: Diagnosis not present

## 2018-05-26 DIAGNOSIS — Z79899 Other long term (current) drug therapy: Secondary | ICD-10-CM | POA: Insufficient documentation

## 2018-05-26 DIAGNOSIS — Z794 Long term (current) use of insulin: Secondary | ICD-10-CM | POA: Insufficient documentation

## 2018-05-26 DIAGNOSIS — E1136 Type 2 diabetes mellitus with diabetic cataract: Secondary | ICD-10-CM | POA: Diagnosis not present

## 2018-05-26 DIAGNOSIS — Z79890 Hormone replacement therapy (postmenopausal): Secondary | ICD-10-CM | POA: Diagnosis not present

## 2018-05-26 DIAGNOSIS — I10 Essential (primary) hypertension: Secondary | ICD-10-CM | POA: Diagnosis not present

## 2018-05-26 DIAGNOSIS — M109 Gout, unspecified: Secondary | ICD-10-CM | POA: Insufficient documentation

## 2018-05-26 DIAGNOSIS — H2512 Age-related nuclear cataract, left eye: Secondary | ICD-10-CM | POA: Insufficient documentation

## 2018-05-26 DIAGNOSIS — E039 Hypothyroidism, unspecified: Secondary | ICD-10-CM | POA: Diagnosis not present

## 2018-05-26 DIAGNOSIS — Z87891 Personal history of nicotine dependence: Secondary | ICD-10-CM | POA: Insufficient documentation

## 2018-05-26 HISTORY — PX: CATARACT EXTRACTION W/PHACO: SHX586

## 2018-05-26 HISTORY — DX: Hypothyroidism, unspecified: E03.9

## 2018-05-26 LAB — GLUCOSE, CAPILLARY
GLUCOSE-CAPILLARY: 114 mg/dL — AB (ref 70–99)
Glucose-Capillary: 56 mg/dL — ABNORMAL LOW (ref 70–99)
Glucose-Capillary: 133 mg/dL — ABNORMAL HIGH (ref 70–99)

## 2018-05-26 SURGERY — PHACOEMULSIFICATION, CATARACT, WITH IOL INSERTION
Anesthesia: Monitor Anesthesia Care | Site: Eye | Laterality: Left

## 2018-05-26 MED ORDER — ACETAMINOPHEN 160 MG/5ML PO SOLN: 325.0000 mg | ORAL | Status: DC | PRN

## 2018-05-26 MED ORDER — MOXIFLOXACIN HCL 0.5 % OP SOLN
1.0000 [drp] | OPHTHALMIC | Status: DC | PRN
  Administered 2018-05-26 (×3): 1 [drp] via OPHTHALMIC

## 2018-05-26 MED ORDER — EPINEPHRINE PF 1 MG/ML IJ SOLN
INTRAOCULAR | Status: DC | PRN
  Administered 2018-05-26: 80 mL via OPHTHALMIC

## 2018-05-26 MED ORDER — ACETAMINOPHEN 325 MG PO TABS: 325.0000 mg | ORAL_TABLET | ORAL | Status: DC | PRN

## 2018-05-26 MED ORDER — FENTANYL CITRATE (PF) 100 MCG/2ML IJ SOLN
INTRAMUSCULAR | Status: DC | PRN
  Administered 2018-05-26: 50 ug via INTRAVENOUS

## 2018-05-26 MED ORDER — ERYTHROMYCIN 5 MG/GM OP OINT
TOPICAL_OINTMENT | OPHTHALMIC | Status: DC | PRN
  Administered 2018-05-26: 1 via OPHTHALMIC

## 2018-05-26 MED ORDER — MIDAZOLAM HCL 2 MG/2ML IJ SOLN
INTRAMUSCULAR | Status: DC | PRN
  Administered 2018-05-26: 1 mg via INTRAVENOUS

## 2018-05-26 MED ORDER — TETRACAINE HCL 0.5 % OP SOLN
1.0000 [drp] | OPHTHALMIC | Status: DC | PRN
  Administered 2018-05-26 (×3): 1 [drp] via OPHTHALMIC

## 2018-05-26 MED ORDER — NA HYALUR & NA CHOND-NA HYALUR 0.4-0.35 ML IO KIT
PACK | INTRAOCULAR | Status: DC | PRN
  Administered 2018-05-26: 1 mL via INTRAOCULAR

## 2018-05-26 MED ORDER — BRIMONIDINE TARTRATE-TIMOLOL 0.2-0.5 % OP SOLN
OPHTHALMIC | Status: DC | PRN
  Administered 2018-05-26: 1 [drp] via OPHTHALMIC

## 2018-05-26 MED ORDER — LACTATED RINGERS IV SOLN: INTRAVENOUS | Status: DC

## 2018-05-26 MED ORDER — DEXTROSE 50 % IV SOLN
25.0000 mL | Freq: Once | INTRAVENOUS | Status: AC
  Administered 2018-05-26: 25 mL via INTRAVENOUS

## 2018-05-26 MED ORDER — ARMC OPHTHALMIC DILATING DROPS
1.0000 "application " | OPHTHALMIC | Status: DC | PRN
  Administered 2018-05-26 (×3): 1 via OPHTHALMIC

## 2018-05-26 MED ORDER — CEFUROXIME OPHTHALMIC INJECTION 1 MG/0.1 ML
INJECTION | OPHTHALMIC | Status: DC | PRN
  Administered 2018-05-26: 0.1 mL via INTRACAMERAL

## 2018-05-26 MED ORDER — LIDOCAINE HCL (PF) 2 % IJ SOLN
INTRAOCULAR | Status: DC | PRN
  Administered 2018-05-26: 2 mL

## 2018-05-26 SURGICAL SUPPLY — 21 items
CANNULA ANT/CHMB 27G (MISCELLANEOUS) ×1 IMPLANT
CANNULA ANT/CHMB 27GA (MISCELLANEOUS) ×3 IMPLANT
GLOVE SURG LX 7.5 STRW (GLOVE) ×2
GLOVE SURG LX STRL 7.5 STRW (GLOVE) ×1 IMPLANT
GLOVE SURG TRIUMPH 8.0 PF LTX (GLOVE) ×3 IMPLANT
GOWN STRL REUS W/ TWL LRG LVL3 (GOWN DISPOSABLE) ×2 IMPLANT
GOWN STRL REUS W/TWL LRG LVL3 (GOWN DISPOSABLE) ×6
LENS IOL TECNIS ITEC 21.5 (Intraocular Lens) ×2 IMPLANT
MARKER SKIN DUAL TIP RULER LAB (MISCELLANEOUS) ×3 IMPLANT
NDL FILTER BLUNT 18X1 1/2 (NEEDLE) ×1 IMPLANT
NEEDLE FILTER BLUNT 18X 1/2SAF (NEEDLE) ×2
NEEDLE FILTER BLUNT 18X1 1/2 (NEEDLE) ×1 IMPLANT
PACK CATARACT BRASINGTON (MISCELLANEOUS) ×3 IMPLANT
PACK EYE AFTER SURG (MISCELLANEOUS) ×3 IMPLANT
PACK OPTHALMIC (MISCELLANEOUS) ×3 IMPLANT
SYR 3ML LL SCALE MARK (SYRINGE) ×3 IMPLANT
SYR 5ML LL (SYRINGE) ×3 IMPLANT
SYR TB 1ML LUER SLIP (SYRINGE) ×3 IMPLANT
WATER STERILE IRR 500ML POUR (IV SOLUTION) ×3 IMPLANT
WICK EYE OCUCEL (MISCELLANEOUS) ×2 IMPLANT
WIPE NON LINTING 3.25X3.25 (MISCELLANEOUS) ×3 IMPLANT

## 2018-05-26 NOTE — Anesthesia Procedure Notes (Signed)
Procedure Name: MAC Date/Time: 05/26/2018 8:06 AM Performed by: Cameron Ali, CRNA Pre-anesthesia Checklist: Patient identified, Emergency Drugs available, Suction available, Timeout performed and Patient being monitored Patient Re-evaluated:Patient Re-evaluated prior to induction Oxygen Delivery Method: Nasal cannula Placement Confirmation: positive ETCO2

## 2018-05-26 NOTE — Transfer of Care (Signed)
Immediate Anesthesia Transfer of Care Note  Patient: Caitlyn Marshall  Procedure(s) Performed: CATARACT EXTRACTION PHACO AND INTRAOCULAR LENS PLACEMENT (IOC)  LEFT DIABETIC (Left Eye)  Patient Location: PACU  Anesthesia Type: MAC  Level of Consciousness: awake, alert  and patient cooperative  Airway and Oxygen Therapy: Patient Spontanous Breathing and Patient connected to supplemental oxygen  Post-op Assessment: Post-op Vital signs reviewed, Patient's Cardiovascular Status Stable, Respiratory Function Stable, Patent Airway and No signs of Nausea or vomiting  Post-op Vital Signs: Reviewed and stable  Complications: No apparent anesthesia complications

## 2018-05-26 NOTE — Op Note (Signed)
OPERATIVE NOTE  Caitlyn Marshall [id number] 05/26/2018   PREOPERATIVE DIAGNOSIS:  Nuclear sclerotic cataract left eye. H25.12   POSTOPERATIVE DIAGNOSIS:    Nuclear sclerotic cataract left eye.     PROCEDURE:  Phacoemusification with posterior chamber intraocular lens placement of the left eye   LENS:   Implant Name Type Inv. Item Serial No. Manufacturer Lot No. LRB No. Used  LENS IOL DIOP 21.5 - [id number] Intraocular Lens LENS IOL DIOP 21.5 [id number]  Left 1        ULTRASOUND TIME: 19  % of 1 minutes 23 seconds, CDE 16.1  SURGEON:  Wyonia Hough, MD   ANESTHESIA:  Topical with tetracaine drops and 2% Xylocaine jelly, augmented with 1% preservative-free intracameral lidocaine.    COMPLICATIONS:  None.   DESCRIPTION OF PROCEDURE:  The patient was identified in the holding room and transported to the operating room and placed in the supine position under the operating microscope.  The left eye was identified as the operative eye and it was prepped and draped in the usual sterile ophthalmic fashion.   A 1 millimeter clear-corneal paracentesis was made at the 1:30 position.  0.5 ml of preservative-free 1% lidocaine was injected into the anterior chamber.  The anterior chamber was filled with Viscoat viscoelastic.  A 2.4 millimeter keratome was used to make a near-clear corneal incision at the 10:30 position.  .  A curvilinear capsulorrhexis was made with a cystotome and capsulorrhexis forceps.  Balanced salt solution was used to hydrodissect and hydrodelineate the nucleus.   Phacoemulsification was then used in stop and chop fashion to remove the lens nucleus and epinucleus.  The remaining cortex was then removed using the irrigation and aspiration handpiece. Provisc was then placed into the capsular bag to distend it for lens placement.  A lens was then injected into the capsular bag.  The remaining viscoelastic was aspirated.   Wounds were hydrated with balanced salt  solution.  The anterior chamber was inflated to a physiologic pressure with balanced salt solution.  No wound leaks were noted. Cefuroxime 0.1 ml of a 10mg /ml solution was injected into the anterior chamber for a dose of 1 mg of intracameral antibiotic at the completion of the case.   Timolol and Brimonidine drops and Erythromycin ointment were applied to the eye.  The patient was taken to the recovery room in stable condition without complications of anesthesia or surgery.  [name] 05/26/2018, 8:25 AM

## 2018-05-26 NOTE — Anesthesia Preprocedure Evaluation (Signed)
Anesthesia Evaluation  Patient identified by MRN, date of birth, ID band Patient awake    Reviewed: Allergy & Precautions, H&P , NPO status , Patient's Chart, lab work & pertinent test results, reviewed documented beta blocker date and time   Airway Mallampati: II  TM Distance: >3 FB Neck ROM: full    Dental no notable dental hx.    Pulmonary neg pulmonary ROS, former smoker,    Pulmonary exam normal breath sounds clear to auscultation       Cardiovascular Exercise Tolerance: Good hypertension,  Rhythm:regular Rate:Normal     Neuro/Psych negative neurological ROS  negative psych ROS   GI/Hepatic negative GI ROS, Neg liver ROS,   Endo/Other  diabetesHypothyroidism   Renal/GU CRF  negative genitourinary   Musculoskeletal   Abdominal   Peds  Hematology negative hematology ROS (+)   Anesthesia Other Findings   Reproductive/Obstetrics negative OB ROS                             Anesthesia Physical Anesthesia Plan  ASA: II  Anesthesia Plan: MAC   Post-op Pain Management:    Induction:   PONV Risk Score and Plan:   Airway Management Planned:   Additional Equipment:   Intra-op Plan:   Post-operative Plan:   Informed Consent: I have reviewed the patients History and Physical, chart, labs and discussed the procedure including the risks, benefits and alternatives for the proposed anesthesia with the patient or authorized representative who has indicated his/her understanding and acceptance.     Dental Advisory Given  Plan Discussed with: CRNA  Anesthesia Plan Comments:         Anesthesia Quick Evaluation

## 2018-05-26 NOTE — H&P (Signed)
.  The History and Physical notes are on paper, have been signed, and are to be scanned. The patient remains stable and unchanged from the H&P.   Previous H&P reviewed, patient examined, and there are no changes.  The patient has a visually significant cataract interfering with his or her vision.  I attest that the following are true and accurate to the best of my knowledge: 1. The patient's impairment of visual function is believed not to be correctable with a tolerable change in glasses or contact lenses. 2. Cataract (in the operative eye) is believed to be significantly contributing to the patient's visual impairment. 3. The patient desires surgical correction; the risks, benefits, and alternatives have been explained, and questions have been answered to the patients satisfaction.  A reasonable expectation exists that cataract surgery will significantly improve both the visual and functional status of the patient.  [place] 05/26/2018 7:31 AM'

## 2018-05-26 NOTE — Anesthesia Postprocedure Evaluation (Signed)
Anesthesia Post Note  Patient: Caitlyn Marshall  Procedure(s) Performed: CATARACT EXTRACTION PHACO AND INTRAOCULAR LENS PLACEMENT (IOC)  LEFT DIABETIC (Left Eye)  Patient location during evaluation: PACU Anesthesia Type: MAC Level of consciousness: awake and alert Pain management: pain level controlled Vital Signs Assessment: post-procedure vital signs reviewed and stable Respiratory status: spontaneous breathing, nonlabored ventilation, respiratory function stable and patient connected to nasal cannula oxygen Cardiovascular status: stable and blood pressure returned to baseline Postop Assessment: no apparent nausea or vomiting Anesthetic complications: no    Alisa Graff

## 2018-05-27 ENCOUNTER — Encounter: Payer: Self-pay | Admitting: Ophthalmology

## 2018-05-27 LAB — HM DIABETES EYE EXAM

## 2018-06-04 LAB — HM DIABETES EYE EXAM

## 2018-07-22 LAB — HM DIABETES EYE EXAM

## 2018-08-05 DIAGNOSIS — E1022 Type 1 diabetes mellitus with diabetic chronic kidney disease: Secondary | ICD-10-CM | POA: Diagnosis not present

## 2018-08-05 DIAGNOSIS — N183 Chronic kidney disease, stage 3 (moderate): Secondary | ICD-10-CM | POA: Diagnosis not present

## 2018-08-05 LAB — HEMOGLOBIN A1C: Hemoglobin A1C: 7.7

## 2018-08-12 DIAGNOSIS — R634 Abnormal weight loss: Secondary | ICD-10-CM | POA: Diagnosis not present

## 2018-08-12 DIAGNOSIS — R112 Nausea with vomiting, unspecified: Secondary | ICD-10-CM | POA: Diagnosis not present

## 2018-08-12 DIAGNOSIS — N183 Chronic kidney disease, stage 3 (moderate): Secondary | ICD-10-CM | POA: Diagnosis not present

## 2018-08-12 DIAGNOSIS — E559 Vitamin D deficiency, unspecified: Secondary | ICD-10-CM | POA: Diagnosis not present

## 2018-08-12 DIAGNOSIS — E039 Hypothyroidism, unspecified: Secondary | ICD-10-CM | POA: Diagnosis not present

## 2018-08-12 DIAGNOSIS — E1022 Type 1 diabetes mellitus with diabetic chronic kidney disease: Secondary | ICD-10-CM | POA: Diagnosis not present

## 2018-08-12 DIAGNOSIS — Z794 Long term (current) use of insulin: Secondary | ICD-10-CM | POA: Diagnosis not present

## 2018-08-26 ENCOUNTER — Emergency Department
Admission: EM | Admit: 2018-08-26 | Discharge: 2018-08-26 | Discharge status: Against Medical Advice | Payer: Medicare HMO | Attending: Emergency Medicine | Admitting: Emergency Medicine

## 2018-08-26 ENCOUNTER — Telehealth: Payer: Self-pay | Admitting: Emergency Medicine

## 2018-08-26 ENCOUNTER — Other Ambulatory Visit: Payer: Self-pay

## 2018-08-26 ENCOUNTER — Encounter: Payer: Self-pay | Admitting: Emergency Medicine

## 2018-08-26 DIAGNOSIS — Z5321 Procedure and treatment not carried out due to patient leaving prior to being seen by health care provider: Secondary | ICD-10-CM | POA: Insufficient documentation

## 2018-08-26 DIAGNOSIS — R630 Anorexia: Secondary | ICD-10-CM | POA: Diagnosis not present

## 2018-08-26 LAB — COMPREHENSIVE METABOLIC PANEL
ALT: 9 U/L (ref 0–44)
AST: 18 U/L (ref 15–41)
Albumin: 3.4 g/dL — ABNORMAL LOW (ref 3.5–5.0)
Alkaline Phosphatase: 89 U/L (ref 38–126)
Anion gap: 14 (ref 5–15)
BUN: 74 mg/dL — ABNORMAL HIGH (ref 8–23)
CO2: 19 mmol/L — ABNORMAL LOW (ref 22–32)
Calcium: 9 mg/dL (ref 8.9–10.3)
Chloride: 107 mmol/L (ref 98–111)
Creatinine, Ser: 2.86 mg/dL — ABNORMAL HIGH (ref 0.44–1.00)
GFR calc Af Amer: 18 mL/min — ABNORMAL LOW (ref >60)
GFR calc non Af Amer: 15 mL/min — ABNORMAL LOW (ref >60)
Glucose, Bld: 221 mg/dL — ABNORMAL HIGH (ref 70–99)
Potassium: 4.3 mmol/L (ref 3.5–5.1)
Sodium: 140 mmol/L (ref 135–145)
Total Bilirubin: 0.7 mg/dL (ref 0.3–1.2)
Total Protein: 7.8 g/dL (ref 6.5–8.1)

## 2018-08-26 LAB — CBC
HCT: 38 % (ref 36.0–46.0)
Hemoglobin: 11.8 g/dL — ABNORMAL LOW (ref 12.0–15.0)
MCH: 25.8 pg — ABNORMAL LOW (ref 26.0–34.0)
MCHC: 31.1 g/dL (ref 30.0–36.0)
MCV: 83.2 fL (ref 80.0–100.0)
Platelets: 312 10*3/uL (ref 150–400)
RBC: 4.57 MIL/uL (ref 3.87–5.11)
RDW: 15.7 % — ABNORMAL HIGH (ref 11.5–15.5)
WBC: 12.7 10*3/uL — ABNORMAL HIGH (ref 4.0–10.5)
nRBC: 0 % (ref 0.0–0.2)

## 2018-08-26 LAB — TROPONIN I: Troponin I: <0.03 ng/mL (ref <0.03)

## 2018-08-26 LAB — LIPASE, BLOOD: Lipase: 17 U/L (ref 11–51)

## 2018-08-26 MED ORDER — SODIUM CHLORIDE 0.9% FLUSH: 3.0000 mL | Freq: Once | INTRAVENOUS | Status: DC

## 2018-08-26 NOTE — ED Triage Notes (Signed)
Decrease appetite x 1 week.  Patient brought in by grandson.  Patient states she hasn't been eating a lot and decreased appetite, but denies all complaint.

## 2018-08-26 NOTE — Telephone Encounter (Signed)
Called patient due to lwot to inquire about condition and follow up plans. She says she is not feeling bad.  I explained that she really needs her doctor to review her lab results, and her kidney function tests.  She agrees to call them.

## 2018-08-28 ENCOUNTER — Telehealth: Payer: Self-pay | Admitting: *Deleted

## 2018-08-28 NOTE — Telephone Encounter (Signed)
Patient's daughter [name] called office requesting Dr. Caryn Section read pt's lab results from 6 /12/2018 ED visit. [name] is concerned about her mother's wt loss. Also [name] wants to get home health for her mother. Advised she will most likely need face to face visit. [name] stated that's fine. [name] lives in [place], but she is coming into town on Wednesday 09/02/2018. Just let her know what she needs to do. Also [name] wants to know if home help once ordered, can do covid testing on her mother in home? [name] would like a call back with results from ED visit. [phone]. Please advise?

## 2018-08-28 NOTE — Telephone Encounter (Signed)
[name] advised.  Apt made for 09/07/2018 at 9:40.    Please review the labs from the ER.  [name] is worried that her mom is dehydrated.     Thanks,   -Mickel Baas

## 2018-08-28 NOTE — Telephone Encounter (Signed)
We have to see her in office before we can order home health. She needs to make an appointment.

## 2018-09-01 DIAGNOSIS — E1022 Type 1 diabetes mellitus with diabetic chronic kidney disease: Secondary | ICD-10-CM | POA: Diagnosis not present

## 2018-09-01 DIAGNOSIS — R634 Abnormal weight loss: Secondary | ICD-10-CM | POA: Diagnosis not present

## 2018-09-01 DIAGNOSIS — R636 Underweight: Secondary | ICD-10-CM | POA: Diagnosis not present

## 2018-09-01 DIAGNOSIS — E039 Hypothyroidism, unspecified: Secondary | ICD-10-CM | POA: Diagnosis not present

## 2018-09-01 DIAGNOSIS — N183 Chronic kidney disease, stage 3 (moderate): Secondary | ICD-10-CM | POA: Diagnosis not present

## 2018-09-01 DIAGNOSIS — Z794 Long term (current) use of insulin: Secondary | ICD-10-CM | POA: Diagnosis not present

## 2018-09-04 ENCOUNTER — Encounter: Payer: Self-pay | Admitting: Family Medicine

## 2018-09-07 ENCOUNTER — Ambulatory Visit (INDEPENDENT_AMBULATORY_CARE_PROVIDER_SITE_OTHER): Payer: Medicare HMO | Admitting: Family Medicine

## 2018-09-07 ENCOUNTER — Other Ambulatory Visit: Payer: Self-pay

## 2018-09-07 ENCOUNTER — Encounter: Payer: Self-pay | Admitting: Family Medicine

## 2018-09-07 VITALS — BP 120/59 | HR 89 | Temp 98.4°F | Resp 16 | Wt 92.0 lb

## 2018-09-07 DIAGNOSIS — R630 Anorexia: Secondary | ICD-10-CM

## 2018-09-07 DIAGNOSIS — N184 Chronic kidney disease, stage 4 (severe): Secondary | ICD-10-CM

## 2018-09-07 DIAGNOSIS — R05 Cough: Secondary | ICD-10-CM

## 2018-09-07 DIAGNOSIS — E1029 Type 1 diabetes mellitus with other diabetic kidney complication: Secondary | ICD-10-CM | POA: Diagnosis not present

## 2018-09-07 DIAGNOSIS — R531 Weakness: Secondary | ICD-10-CM

## 2018-09-07 DIAGNOSIS — R634 Abnormal weight loss: Secondary | ICD-10-CM | POA: Diagnosis not present

## 2018-09-07 DIAGNOSIS — R918 Other nonspecific abnormal finding of lung field: Secondary | ICD-10-CM

## 2018-09-07 DIAGNOSIS — E1122 Type 2 diabetes mellitus with diabetic chronic kidney disease: Secondary | ICD-10-CM | POA: Diagnosis not present

## 2018-09-07 DIAGNOSIS — I129 Hypertensive chronic kidney disease with stage 1 through stage 4 chronic kidney disease, or unspecified chronic kidney disease: Secondary | ICD-10-CM

## 2018-09-07 DIAGNOSIS — R059 Cough, unspecified: Secondary | ICD-10-CM

## 2018-09-07 DIAGNOSIS — E039 Hypothyroidism, unspecified: Secondary | ICD-10-CM

## 2018-09-07 MED ORDER — CYPROHEPTADINE HCL 4 MG PO TABS: ORAL_TABLET | ORAL | 1 refills | Status: DC

## 2018-09-07 NOTE — Progress Notes (Signed)
Patient: Caitlyn Marshall Female    DOB: May 12, 1940   78 y.o.   MRN: [id number] Visit Date: 09/07/2018  Today's Provider: Lelon Huh, MD   Chief Complaint  Patient presents with  . Follow-up   Subjective:     HPI Home Health Evaluation: Patient comes in today with her daughter requesting home health services. Patient is needing help in the home with someone coming out to monitor patient. Her daughter is visiting from [place] and is concerned that her mother is getting increasing weak, is not eating and is losing weight.   Wt Readings from Last 5 Encounters:  09/07/18 92 lb (41.7 kg)  08/26/18 91 lb 4.8 oz (41.4 kg)  05/26/18 108 lb (49 kg)  11/12/17 107 lb 12.8 oz (48.9 kg)  10/08/17 107 lb 12.8 oz (48.9 kg)   Patient was found to have lung nodules on previous CT scans worrisome for malignancy. She was referred to pulmonary on multiple occasions and strongly advised that she needed to follow up on this findings since they may be treatable, but life threatening. However she has repeatedly refused follow up and did not show for pulmonary appointments.   Her daughter also reports that patient has had very persistent cough which is usually non-productive, but she is not short of breath. She has also had poor appetite.  No Known Allergies   Current Outpatient Medications:  .  allopurinol (ZYLOPRIM) 100 MG tablet, TAKE 2 TABLETS(200 MG) BY MOUTH TWICE DAILY, Disp: 360 tablet, Rfl: 3 .  amLODipine (NORVASC) 10 MG tablet, Take 10 mg by mouth daily., Disp: , Rfl:  .  aspirin EC 81 MG tablet, Take 81 mg by mouth daily. , Disp: , Rfl:  .  hydrochlorothiazide (HYDRODIURIL) 12.5 MG tablet, TK 1 T PO QAM UTD FOR LEG SWELLING, Disp: , Rfl: 11 .  insulin NPH Human (NOVOLIN N) 100 UNIT/ML injection, Inject 6-7 Units into the skin 2 (two) times daily before a meal. , Disp: , Rfl:  .  insulin regular (NOVOLIN R,HUMULIN R) 100 units/mL injection, Inject 5-9 Units into the skin 3  (three) times daily before meals. 6 units in AM, 8 units at lunch and 10 units in PM, Disp: , Rfl:  .  Insulin Syringe-Needle U-100 (INSULIN SYRINGE .5CC/30GX5/16") 30G X 5/16" 0.5 ML MISC, U QID UTD, Disp: , Rfl: 5 .  irbesartan (AVAPRO) 300 MG tablet, Take 300 mg by mouth every evening., Disp: , Rfl: 8 .  levothyroxine (SYNTHROID, LEVOTHROID) 50 MCG tablet, Take 1 tablet by mouth daily., Disp: , Rfl: 5 .  lovastatin (MEVACOR) 20 MG tablet, TAKE 1 TABLET BY MOUTH DAILY, Disp: 90 tablet, Rfl: 3 .  montelukast (SINGULAIR) 10 MG tablet, TAKE 1 TABLET BY MOUTH EVERY DAY, Disp: 90 tablet, Rfl: 4 .  ONE TOUCH ULTRA TEST test strip, TEST THREE TIMES DAILY, Disp: 100 each, Rfl: 5 .  ONETOUCH DELICA LANCETS 29B MISC, INJECT AS DIRECTED THREE TIMES DAILY, Disp: , Rfl:  .  potassium chloride SA (K-DUR,KLOR-CON) 20 MEQ tablet, Take 1 tablet by mouth daily., Disp: , Rfl: 2 .  ondansetron (ZOFRAN) 4 MG tablet, Take 1 tablet by mouth every 8 (eight) hours as needed. For nausea, Disp: , Rfl:   Review of Systems  Constitutional: Positive for fatigue and unexpected weight change (weight loss). Negative for appetite change, chills and fever.  Respiratory: Negative for chest tightness and shortness of breath.   Cardiovascular: Negative for chest pain and palpitations.  Gastrointestinal: Positive for nausea. Negative for abdominal pain and vomiting.  Neurological: Positive for weakness. Negative for dizziness.  Psychiatric/Behavioral: The patient is nervous/anxious.     Social History   Tobacco Use  . Smoking status: Former Smoker    Packs/day: 0.50    Years: 30.00    Pack years: 15.00    Types: Cigarettes    Quit date: 03/18/2007    Years since quitting: 11.4  . Smokeless tobacco: Never Used  . Tobacco comment: Quit smoking in 2008  Substance Use Topics  . Alcohol use: No    Alcohol/week: 0.0 standard drinks      Objective:   BP (!) 120/59 (BP Location: Right Arm, Cuff Size: Normal)   Pulse 89    Temp 98.4 F (36.9 C) (Oral)   Resp 16   Wt 92 lb (41.7 kg)   SpO2 99% Comment: room air  BMI 14.85 kg/m  Vitals:   09/07/18 0943 09/07/18 0948  BP: (!) 120/42 (!) 120/59  Pulse: 89   Resp: 16   Temp: 98.4 F (36.9 C)   TempSrc: Oral   SpO2: 99%   Weight: 92 lb (41.7 kg)      Physical Exam   General Appearance:    Alert, cooperative, no distress, cachectic appearing  Eyes:    PERRL, conjunctiva/corneas clear, EOM's intact       Lungs:     Clear to auscultation bilaterally, respirations unlabored  Heart:    Regular rate and rhythm  Neurologic:   Awake, alert, oriented x 3. Very slow to get up. MS about +4 throughout.           Assessment & Plan    1. Cough Chronic, likely secondary to lung mass - DG Chest 2 View; Future  2. Weight loss Diabetes has been fairly well controlled. Concerning of metastatic malignancy.  - Renal function panel - cyproheptadine (PERIACTIN) 4 MG tablet; Start 1/2 tablet three times daily for one week, then increase to 1 tablet three times daily  Dispense: 90 tablet; Refill: 1 - Ambulatory referral to Home Health  3. Hypothyroidism, unspecified type  - TSH  4. Generalized weakness  - Ambulatory referral to [facility]  5. 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 Lung mass Discussed with patient and daughter the seriousness of this finding and that patient needs to follow up with specialist. Anticipate referral to Onc and/or pulmonary after reviewing xray from today.   8. Anorexia  - cyproheptadine (PERIACTIN) 4 MG tablet; Start 1/2 tablet three times daily for one week, then increase to 1 tablet three times daily  Dispense: 90 tablet; Refill: 1  Other orders - ondansetron (ZOFRAN) 4 MG tablet; Take 1 tablet by mouth every 8 (eight) hours as needed. For nausea      Lelon Huh, MD   [facility]

## 2018-09-08 LAB — RENAL FUNCTION PANEL
Albumin: 3.9 g/dL (ref 3.7–4.7)
BUN/Creatinine Ratio: 14 (ref 12–28)
BUN: 34 mg/dL — ABNORMAL HIGH (ref 8–27)
CO2: 20 mmol/L (ref 20–29)
Calcium: 10.1 mg/dL (ref 8.7–10.3)
Chloride: 102 mmol/L (ref 96–106)
Creatinine, Ser: 2.42 mg/dL — ABNORMAL HIGH (ref 0.57–1.00)
GFR calc Af Amer: 22 mL/min/{1.73_m2} — ABNORMAL LOW (ref >59)
GFR calc non Af Amer: 19 mL/min/{1.73_m2} — ABNORMAL LOW (ref >59)
Glucose: 228 mg/dL — ABNORMAL HIGH (ref 65–99)
Phosphorus: 3.9 mg/dL (ref 3.0–4.3)
Potassium: 5.2 mmol/L (ref 3.5–5.2)
Sodium: 139 mmol/L (ref 134–144)

## 2018-09-08 LAB — TSH: TSH: 6.01 u[IU]/mL — ABNORMAL HIGH (ref 0.450–4.500)

## 2018-09-09 ENCOUNTER — Telehealth: Payer: Self-pay

## 2018-09-09 ENCOUNTER — Telehealth: Payer: Self-pay | Admitting: Family Medicine

## 2018-09-09 NOTE — Telephone Encounter (Signed)
-----   Message from Birdie Sons, MD sent at 09/09/2018  2:49 PM EDT ----- Kidney functions are getting bad. Need to drink more fluids. Rest of labs are normal. 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 Have entered referral to oncology and pulmonology. Please make sure patient's daughter on DPR is aware.

## 2018-09-09 NOTE — Telephone Encounter (Signed)
Left message to call back;    Thanks,   -Mickel Baas

## 2018-09-09 NOTE — Telephone Encounter (Signed)
Caitlyn Marshall, PT w/ [facility] [phone]  Pt refused home health services. Never wanted wanted home health services.  Pt states her daughter wanted her to have the services.  She didn't want them.  Thanks, [employer]

## 2018-09-09 NOTE — Telephone Encounter (Signed)
Corene Cornea from [facility] call to advise that patient has refused home health referral.His call back number is [phone]

## 2018-09-10 NOTE — Telephone Encounter (Signed)
Patient's daughter advised and agrees with referrals. Please schedule. She prefers appointments on Wednesdays or Thursdays.

## 2018-09-23 ENCOUNTER — Ambulatory Visit: Payer: Medicare HMO | Admitting: Family Medicine

## 2018-09-24 ENCOUNTER — Institutional Professional Consult (permissible substitution): Payer: Medicare HMO | Admitting: Pulmonary Disease

## 2018-09-28 NOTE — Patient Instructions (Signed)
.   Please review the attached list of medications and notify my office if there are any errors.   . Please bring all of your medications to every appointment so we can make sure that our medication list is the same as yours.   . We will have flu vaccines available after Labor Day. Please go to your pharmacy or call the office in early September to schedule you flu shot.   

## 2018-09-29 ENCOUNTER — Ambulatory Visit: Payer: Medicare HMO | Admitting: Family Medicine

## 2018-09-30 ENCOUNTER — Other Ambulatory Visit: Payer: Self-pay | Admitting: Family Medicine

## 2018-09-30 DIAGNOSIS — R64 Cachexia: Secondary | ICD-10-CM

## 2018-09-30 DIAGNOSIS — R911 Solitary pulmonary nodule: Secondary | ICD-10-CM

## 2018-09-30 NOTE — Progress Notes (Unsigned)
Patient has persistent weight loss and history of lung mass suspicious for malignancy. She has no showed for previous pulmonary referral and cancelled recent referral due to transportation issues. Please see If there is any way to assist her. I think there is a new transportation program per e-mail from [facility].

## 2018-10-02 ENCOUNTER — Ambulatory Visit: Payer: Self-pay

## 2018-10-02 DIAGNOSIS — N183 Chronic kidney disease, stage 3 unspecified: Secondary | ICD-10-CM

## 2018-10-02 DIAGNOSIS — R911 Solitary pulmonary nodule: Secondary | ICD-10-CM

## 2018-10-02 DIAGNOSIS — R634 Abnormal weight loss: Secondary | ICD-10-CM

## 2018-10-02 NOTE — Chronic Care Management (AMB) (Signed)
   Chronic Care Management   Unsuccessful Call Note 10/02/2018 Name: Caitlyn Marshall MRN: [id number] DOB: 12/22/1940  [name] is a 78 year old female who sees Dr. Lelon Huh for primary care. Dr. Caryn Section asked the CCM team to consult the patient for chronic care management/disease management AND transportation resources as she has missed appointments secondary to transportation barriers. Referral was placed 09/30/2018. Patient's last office visit was 09/07/2018.     Was unable to reach patient via telephone today for introduction to CCM services. I have left HIPAA compliant voicemail asking patient to return my call. (unsuccessful outreach #[phone]).   Plan: Will follow-up within 7 business days via telephone.    Ruthy Forry E. Rollene Rotunda, RN, BSN Nurse Care Coordinator [facility] Care Management [phone]

## 2018-10-08 DIAGNOSIS — R600 Localized edema: Secondary | ICD-10-CM | POA: Diagnosis not present

## 2018-10-08 DIAGNOSIS — N184 Chronic kidney disease, stage 4 (severe): Secondary | ICD-10-CM | POA: Diagnosis not present

## 2018-10-08 DIAGNOSIS — E1029 Type 1 diabetes mellitus with other diabetic kidney complication: Secondary | ICD-10-CM | POA: Diagnosis not present

## 2018-10-08 DIAGNOSIS — I129 Hypertensive chronic kidney disease with stage 1 through stage 4 chronic kidney disease, or unspecified chronic kidney disease: Secondary | ICD-10-CM | POA: Diagnosis not present

## 2018-10-08 DIAGNOSIS — E1129 Type 2 diabetes mellitus with other diabetic kidney complication: Secondary | ICD-10-CM | POA: Diagnosis not present

## 2018-10-14 ENCOUNTER — Other Ambulatory Visit: Payer: Self-pay

## 2018-10-14 ENCOUNTER — Ambulatory Visit (INDEPENDENT_AMBULATORY_CARE_PROVIDER_SITE_OTHER): Payer: Medicare Other

## 2018-10-14 DIAGNOSIS — Z0000 Encounter for general adult medical examination without abnormal findings: Secondary | ICD-10-CM

## 2018-10-14 NOTE — Patient Instructions (Signed)
[name] , Thank you for taking time to come for your Medicare Wellness Visit. I appreciate your ongoing commitment to your health goals. Please review the following plan we discussed and let me know if I can assist you in the future.   Screening recommendations/referrals: Colonoscopy: No longer required.  Mammogram: No longer required.  Bone Density: Declined DEXA referral today.  Recommended yearly ophthalmology/optometry visit for glaucoma screening and checkup Recommended yearly dental visit for hygiene and checkup  Vaccinations: Influenza vaccine: Up to date Pneumococcal vaccine: Completed series Tdap vaccine: Pt declines today.  Shingles vaccine: Pt declines today.     Advanced directives: Please bring a copy of your POA (Power of Attorney) and/or Living Will to your next appointment.   Conditions/risks identified: Continue to increase water intake to 6-8 8 oz glasses a day.   Next appointment: None. Pt declined scheduling a CPE for this year or an AWV for 2021.    Preventive Care 65 Years and Older, Female Preventive care refers to lifestyle choices and visits with your health care provider that can promote health and wellness. What does preventive care include?  A yearly physical exam. This is also called an annual well check.  Dental exams once or twice a year.  Routine eye exams. Ask your health care provider how often you should have your eyes checked.  Personal lifestyle choices, including:  Daily care of your teeth and gums.  Regular physical activity.  Eating a healthy diet.  Avoiding tobacco and drug use.  Limiting alcohol use.  Practicing safe sex.  Taking low-dose aspirin every day.  Taking vitamin and mineral supplements as recommended by your health care provider. What happens during an annual well check? The services and screenings done by your health care provider during your annual well check will depend on your age, overall health, lifestyle  risk factors, and family history of disease. Counseling  Your health care provider may ask you questions about your:  Alcohol use.  Tobacco use.  Drug use.  Emotional well-being.  Home and relationship well-being.  Sexual activity.  Eating habits.  History of falls.  Memory and ability to understand (cognition).  Work and work Statistician.  Reproductive health. Screening  You may have the following tests or measurements:  Height, weight, and BMI.  Blood pressure.  Lipid and cholesterol levels. These may be checked every 5 years, or more frequently if you are over 16 years old.  Skin check.  Lung cancer screening. You may have this screening every year starting at age 59 if you have a 30-pack-year history of smoking and currently smoke or have quit within the past 15 years.  Fecal occult blood test (FOBT) of the stool. You may have this test every year starting at age 69.  Flexible sigmoidoscopy or colonoscopy. You may have a sigmoidoscopy every 5 years or a colonoscopy every 10 years starting at age 40.  Hepatitis C blood test.  Hepatitis B blood test.  Sexually transmitted disease (STD) testing.  Diabetes screening. This is done by checking your blood sugar (glucose) after you have not eaten for a while (fasting). You may have this done every 1-3 years.  Bone density scan. This is done to screen for osteoporosis. You may have this done starting at age 62.  Mammogram. This may be done every 1-2 years. Talk to your health care provider about how often you should have regular mammograms. Talk with your health care provider about your test results, treatment options, and  if necessary, the need for more tests. Vaccines  Your health care provider may recommend certain vaccines, such as:  Influenza vaccine. This is recommended every year.  Tetanus, diphtheria, and acellular pertussis (Tdap, Td) vaccine. You may need a Td booster every 10 years.  Zoster vaccine.  You may need this after age 1.  Pneumococcal 13-valent conjugate (PCV13) vaccine. One dose is recommended after age 8.  Pneumococcal polysaccharide (PPSV23) vaccine. One dose is recommended after age 58. Talk to your health care provider about which screenings and vaccines you need and how often you need them. This information is not intended to replace advice given to you by your health care provider. Make sure you discuss any questions you have with your health care provider. Document Released: 03/31/2015 Document Revised: 11/22/2015 Document Reviewed: 01/03/2015 Elsevier Interactive Patient Education  2017 [employer] Prevention in the Home Falls can cause injuries. They can happen to people of all ages. There are many things you can do to make your home safe and to help prevent falls. What can I do on the outside of my home?  Regularly fix the edges of walkways and driveways and fix any cracks.  Remove anything that might make you trip as you walk through a door, such as a raised step or threshold.  Trim any bushes or trees on the path to your home.  Use bright outdoor lighting.  Clear any walking paths of anything that might make someone trip, such as rocks or tools.  Regularly check to see if handrails are loose or broken. Make sure that both sides of any steps have handrails.  Any raised decks and porches should have guardrails on the edges.  Have any leaves, snow, or ice cleared regularly.  Use sand or salt on walking paths during winter.  Clean up any spills in your garage right away. This includes oil or grease spills. What can I do in the bathroom?  Use night lights.  Install grab bars by the toilet and in the tub and shower. Do not use towel bars as grab bars.  Use non-skid mats or decals in the tub or shower.  If you need to sit down in the shower, use a plastic, non-slip stool.  Keep the floor dry. Clean up any water that spills on the floor as soon  as it happens.  Remove soap buildup in the tub or shower regularly.  Attach bath mats securely with double-sided non-slip rug tape.  Do not have throw rugs and other things on the floor that can make you trip. What can I do in the bedroom?  Use night lights.  Make sure that you have a light by your bed that is easy to reach.  Do not use any sheets or blankets that are too big for your bed. They should not hang down onto the floor.  Have a firm chair that has side arms. You can use this for support while you get dressed.  Do not have throw rugs and other things on the floor that can make you trip. What can I do in the kitchen?  Clean up any spills right away.  Avoid walking on wet floors.  Keep items that you use a lot in easy-to-reach places.  If you need to reach something above you, use a strong step stool that has a grab bar.  Keep electrical cords out of the way.  Do not use floor polish or wax that makes floors slippery. If you  must use wax, use non-skid floor wax.  Do not have throw rugs and other things on the floor that can make you trip. What can I do with my stairs?  Do not leave any items on the stairs.  Make sure that there are handrails on both sides of the stairs and use them. Fix handrails that are broken or loose. Make sure that handrails are as long as the stairways.  Check any carpeting to make sure that it is firmly attached to the stairs. Fix any carpet that is loose or worn.  Avoid having throw rugs at the top or bottom of the stairs. If you do have throw rugs, attach them to the floor with carpet tape.  Make sure that you have a light switch at the top of the stairs and the bottom of the stairs. If you do not have them, ask someone to add them for you. What else can I do to help prevent falls?  Wear shoes that:  Do not have high heels.  Have rubber bottoms.  Are comfortable and fit you well.  Are closed at the toe. Do not wear sandals.  If  you use a stepladder:  Make sure that it is fully opened. Do not climb a closed stepladder.  Make sure that both sides of the stepladder are locked into place.  Ask someone to hold it for you, if possible.  Clearly mark and make sure that you can see:  Any grab bars or handrails.  First and last steps.  Where the edge of each step is.  Use tools that help you move around (mobility aids) if they are needed. These include:  Canes.  Walkers.  Scooters.  Crutches.  Turn on the lights when you go into a dark area. Replace any light bulbs as soon as they burn out.  Set up your furniture so you have a clear path. Avoid moving your furniture around.  If any of your floors are uneven, fix them.  If there are any pets around you, be aware of where they are.  Review your medicines with your doctor. Some medicines can make you feel dizzy. This can increase your chance of falling. Ask your doctor what other things that you can do to help prevent falls. This information is not intended to replace advice given to you by your health care provider. Make sure you discuss any questions you have with your health care provider. Document Released: 12/29/2008 Document Revised: 08/10/2015 Document Reviewed: 04/08/2014 Elsevier Interactive Patient Education  2017 [employer].

## 2018-10-14 NOTE — Progress Notes (Signed)
Subjective:   Caitlyn Marshall is a 78 y.o. female who presents for Medicare Annual (Subsequent) preventive examination.    This visit is being conducted through telemedicine due to the COVID-19 pandemic. This patient has given me verbal consent via doximity to conduct this visit, patient states they are participating from their home address. Some vital signs may be absent or patient reported.    Patient identification: identified by name, DOB, and current address  Review of Systems:  N/A  Cardiac Risk Factors include: advanced age (>34men, >68 women);diabetes mellitus;dyslipidemia;hypertension     Objective:     Vitals: There were no vitals taken for this visit.  There is no height or weight on file to calculate BMI. Unable to obtain vitals due to visit being conducted via telephonically.   Advanced Directives 10/14/2018 08/26/2018 05/26/2018 10/08/2017 03/17/2017 03/17/2017 10/02/2016  Does Patient Have a Medical Advance Directive? Yes No Yes Yes No No Yes  Type of Paramedic of [place];Living will - [place];Living will [place];Living will - - [place];Living will  Does patient want to make changes to medical advance directive? - - - - - - -  Copy of [place] in Chart? No - copy requested - Yes - validated most recent copy scanned in chart (See row information) No - copy requested - - No - copy requested  Would patient like information on creating a medical advance directive? - No - Patient declined - - No - Patient declined - -    Tobacco Social History   Tobacco Use  Smoking Status Former Smoker  . Packs/day: 0.50  . Years: 30.00  . Pack years: 15.00  . Types: Cigarettes  . Quit date: 03/18/2007  . Years since quitting: 11.5  Smokeless Tobacco Never Used  Tobacco Comment   Quit smoking in 2008     Counseling given: Not Answered Comment: Quit smoking in 2008    Clinical Intake:  Pre-visit preparation completed: Yes  Pain Score: 0-No pain     Nutritional Risks: None Diabetes: Yes  How often do you need to have someone help you when you read instructions, pamphlets, or other written materials from your doctor or pharmacy?: 1 - Never   Diabetes:  Is the patient diabetic?  Yes type 1  If diabetic, was a CBG obtained today?  No  Did the patient bring in their glucometer from home?  No  How often do you monitor your CBG's? 5 times daily.   Financial Strains and Diabetes Management:  Are you having any financial strains with the device, your supplies or your medication? No .  Does the patient want to be seen by Chronic Care Management for management of their diabetes?  No  Would the patient like to be referred to a Nutritionist or for Diabetic Management?  No   Diabetic Exams:  Diabetic Eye Exam: Completed 02/19/18. Repeat yearly.  Diabetic Foot Exam: Completed 08/12/18. Repeat yearly.    Interpreter Needed?: No  Information entered by :: [facility], LPN  Past Medical History:  Diagnosis Date  . Chronic kidney disease   . Diabetes mellitus without complication ([place])   . Gout   . Hyperlipidemia   . Hypertension   . Hypothyroidism   . Thyroid disease    Past Surgical History:  Procedure Laterality Date  . CATARACT EXTRACTION W/PHACO Left 05/26/2018   Procedure: CATARACT EXTRACTION PHACO AND INTRAOCULAR LENS PLACEMENT (IOC)  LEFT DIABETIC;  Surgeon: Leandrew Koyanagi, MD;  Location: [place];  Service: Ophthalmology;  Laterality: Left;  Diabetic - insulin  . TUBAL LIGATION    . Vascular Stent in right leg     Family History  Problem Relation Age of Onset  . Hypertension Mother   . Diabetes Mother        Diabetes mellitus Type 2  . Alzheimer's disease Mother   . Cancer Son        died at age 48, esophagus   Social History   Socioeconomic History  . Marital status: Widowed    Spouse name: Not on file  .  Number of children: 3  . Years of education: Not on file  . Highest education level: 12th grade  Occupational History  . Occupation: retired  Scientific laboratory technician  . Financial resource strain: Not hard at all  . Food insecurity    Worry: Never true    Inability: Never true  . Transportation needs    Medical: No    Non-medical: No  Tobacco Use  . Smoking status: Former Smoker    Packs/day: 0.50    Years: 30.00    Pack years: 15.00    Types: Cigarettes    Quit date: 03/18/2007    Years since quitting: 11.5  . Smokeless tobacco: Never Used  . Tobacco comment: Quit smoking in 2008  Substance and Sexual Activity  . Alcohol use: No    Alcohol/week: 0.0 standard drinks  . Drug use: No  . Sexual activity: Not on file  Lifestyle  . Physical activity    Days per week: 0 days    Minutes per session: 0 min  . Stress: Not at all  Relationships  . Social Herbalist on phone: Patient refused    Gets together: Patient refused    Attends religious service: Patient refused    Active member of club or organization: Patient refused    Attends meetings of clubs or organizations: Patient refused    Relationship status: Patient refused  Other Topics Concern  . Not on file  Social History Narrative  . Not on file    Outpatient Encounter Medications as of 10/14/2018  Medication Sig  . allopurinol (ZYLOPRIM) 100 MG tablet TAKE 2 TABLETS(200 MG) BY MOUTH TWICE DAILY  . amLODipine (NORVASC) 10 MG tablet Take 10 mg by mouth daily.  Marland Kitchen aspirin EC 81 MG tablet Take 81 mg by mouth daily.   . cyproheptadine (PERIACTIN) 4 MG tablet Start 1/2 tablet three times daily for one week, then increase to 1 tablet three times daily (Patient taking differently: Take 4 mg by mouth 3 (three) times daily. Start 1/2 tablet three times daily for one week, then increase to 1 tablet three times daily)  . hydrochlorothiazide (HYDRODIURIL) 12.5 MG tablet TK 1 T PO QAM UTD FOR LEG SWELLING  . insulin NPH Human  (NOVOLIN N) 100 UNIT/ML injection Inject 6-7 Units into the skin 2 (two) times daily before a meal.   . insulin regular (NOVOLIN R,HUMULIN R) 100 units/mL injection Inject 5-9 Units into the skin 3 (three) times daily before meals. 6 units in AM, 8 units at lunch and 10 units in PM  . Insulin Syringe-Needle U-100 (INSULIN SYRINGE .5CC/30GX5/16") 30G X 5/16" 0.5 ML MISC U QID UTD  . irbesartan (AVAPRO) 300 MG tablet Take 300 mg by mouth every evening.  Marland Kitchen levothyroxine (SYNTHROID, LEVOTHROID) 50 MCG tablet Take 1 tablet by mouth daily.  Marland Kitchen lovastatin (MEVACOR)  20 MG tablet TAKE 1 TABLET BY MOUTH DAILY  . montelukast (SINGULAIR) 10 MG tablet TAKE 1 TABLET BY MOUTH EVERY DAY  . ondansetron (ZOFRAN) 4 MG tablet Take 1 tablet by mouth every 8 (eight) hours as needed. For nausea  . ONE TOUCH ULTRA TEST test strip TEST THREE TIMES DAILY  . ONETOUCH DELICA LANCETS 24M MISC INJECT AS DIRECTED THREE TIMES DAILY  . potassium chloride SA (K-DUR,KLOR-CON) 20 MEQ tablet Take 1 tablet by mouth daily.  Marland Kitchen torsemide (DEMADEX) 20 MG tablet Take 20 mg by mouth daily.    No facility-administered encounter medications on file as of 10/14/2018.     Activities of Daily Living In your present state of health, do you have any difficulty performing the following activities: 10/14/2018 05/26/2018  Hearing? N N  Vision? N N  Comment Wears eye glasses daily. -  Difficulty concentrating or making decisions? N N  Walking or climbing stairs? N N  Dressing or bathing? N N  Doing errands, shopping? Y -  Comment Does not drive. -  Preparing Food and eating ? N -  Using the Toilet? N -  In the past six months, have you accidently leaked urine? N -  Do you have problems with loss of bowel control? N -  Managing your Medications? N -  Managing your Finances? N -  Housekeeping or managing your Housekeeping? N -  Some recent data might be hidden    Patient Care Team: Birdie Sons, MD as PCP - General (Family Medicine)  [name], OD as Consulting Physician (Optometry) [name], [name], MD as Physician Assistant (Endocrinology) [name], MD as Consulting Physician (Internal Medicine) [name], MD as Consulting Physician (Pulmonary Disease)    Assessment:   This is a routine wellness examination for [place].  Exercise Activities and Dietary recommendations Current Exercise Habits: The patient does not participate in regular exercise at present, Exercise limited by: None identified  Goals    . Increase water intake     Recommend increasing water intake to 4-6 glasses a day.        Fall Risk: Fall Risk  10/14/2018 10/08/2017 10/02/2016 10/25/2015 09/27/2015  Falls in the past year? 0 No No No No    FALL RISK PREVENTION PERTAINING TO THE HOME:  Any stairs in or around the home? Yes  If so, are there any without handrails? No   Home free of loose throw rugs in walkways, pet beds, electrical cords, etc? Yes  Adequate lighting in your home to reduce risk of falls? Yes   ASSISTIVE DEVICES UTILIZED TO PREVENT FALLS:  Life alert? No  Use of a cane, walker or w/c? No  Grab bars in the bathroom? No  Shower chair or bench in shower? No  Elevated toilet seat or a handicapped toilet? No   TIMED UP AND GO:  Was the test performed? No .    Depression Screen PHQ 2/9 Scores 10/14/2018 10/08/2017 10/08/2017 10/02/2016  PHQ - 2 Score 0 0 0 0  PHQ- 9 Score - 2 - 2     Cognitive Function: Declined today.      6CIT Screen 10/02/2016  What Year? 0 points  What month? 0 points  What time? 0 points  Count back from 20 0 points  Months in reverse 0 points  Repeat phrase 2 points  Total Score 2    Immunization History  Administered Date(s) Administered  . Influenza, High Dose Seasonal PF 12/22/2014, 02/02/2016, 01/08/2017  .  Influenza-Unspecified 12/19/2017  . Pneumococcal Conjugate-13 11/03/2013  . Pneumococcal Polysaccharide-23 04/15/2007, 08/07/2011    Qualifies for Shingles  Vaccine? Yes . Due for Shingrix. Education has been provided regarding the importance of this vaccine. Pt has been advised to call insurance company to determine out of pocket expense. Advised may also receive vaccine at local pharmacy or Health Dept. Verbalized acceptance and understanding.  Tdap: Although this vaccine is not a covered service during a Wellness Exam, does the patient still wish to receive this vaccine today? No.   Flu Vaccine: Up to date  Pneumococcal Vaccine: Completed series  Screening Tests Health Maintenance  Topic Date Due  . TETANUS/TDAP  03/18/2026 (Originally 03/12/1960)  . INFLUENZA VACCINE  10/17/2018  . HEMOGLOBIN A1C  02/05/2019  . OPHTHALMOLOGY EXAM  02/20/2019  . FOOT EXAM  08/12/2019  . DEXA SCAN  Completed  . PNA vac Low Risk Adult  Completed    Cancer Screenings:  Colorectal Screening: No longer required.   Mammogram: No longer required.   Bone Density: Completed 04/16/12. Unsure of results. Inquired about another scan and pt declined.   Lung Cancer Screening: (Low Dose CT Chest recommended if Age 7-80 years, 30 pack-year currently smoking OR have quit w/in 15years.) does not qualify.   Additional Screening:  Dental Screening: Recommended annual dental exams for proper oral hygiene   Community Resource Referral:  CRR required this visit?  No       Plan:  I have personally reviewed and addressed the Medicare Annual Wellness questionnaire and have noted the following in the patient's chart:  A. Medical and social history B. Use of alcohol, tobacco or illicit drugs  C. Current medications and supplements D. Functional ability and status E.  Nutritional status F.  Physical activity G. Advance directives H. List of other physicians I.  Hospitalizations, surgeries, and ER visits in previous 12 months J.  [place] such as hearing and vision if needed, cognitive and depression L. Referrals and appointments   In addition,  I have reviewed and discussed with patient certain preventive protocols, quality metrics, and best practice recommendations. A written personalized care plan for preventive services as well as general preventive health recommendations were provided to patient.   Glendora Score, LPN  07/23/2255 Nurse Health Advisor   Nurse Notes: None.

## 2018-10-16 ENCOUNTER — Ambulatory Visit: Payer: Self-pay | Admitting: *Deleted

## 2018-10-16 DIAGNOSIS — R911 Solitary pulmonary nodule: Secondary | ICD-10-CM

## 2018-10-16 DIAGNOSIS — N183 Chronic kidney disease, stage 3 unspecified: Secondary | ICD-10-CM

## 2018-10-16 NOTE — Chronic Care Management (AMB) (Signed)
   Chronic Care Management   Unsuccessful Call Note 10/16/2018 Name: MAJESTIC MOLONY MRN: [id number] DOB: [birth year]  Sharlyn Bologna. Aarons is a 78 year old femalewho sees Dr. Lelon Huh for primary care. Dr. Cletus Gash the CCM team to consult the patient for chronic care management/disease management AND transportation resources as she has missed appointments secondary to transportation barriers. Referral was placed7/15/2020. Patient's last office visit was 09/07/2018.    This social worker was unable to reach patient via telephone today to consent for CCM services. I have left HIPAA compliant voicemail asking patient to return my call. (unsuccessful outreach #2).   Plan: Will follow-up within 7 business days via telephone.      Elliot Gurney, [place] Worker  [facility] Care Management [phone]

## 2018-10-21 ENCOUNTER — Institutional Professional Consult (permissible substitution): Payer: Medicare Other | Admitting: Pulmonary Disease

## 2018-10-26 ENCOUNTER — Ambulatory Visit: Payer: Self-pay | Admitting: *Deleted

## 2018-10-26 NOTE — Chronic Care Management (AMB) (Signed)
  Chronic Care Management   Social Work Note  10/26/2018 Name: LYVIA MONDESIR MRN: [id number] DOB: 07/13/1940  ALFRED ECKLEY is a 78 y.o. year old female who sees Fisher, Kirstie Peri, MD for primary care. The CCM team was consulted for assistance with Community Resources  related to transprotation.   Patient answered the phone, however stated that she could not talk due to having company. Patient requested a return call tomorrow.  Goals Addressed   None     Follow Up Plan: SW will follow up with patient by phone over the next 7 days     [place], [place] Worker  [place] Management [phone]

## 2018-10-27 ENCOUNTER — Ambulatory Visit: Payer: Self-pay | Admitting: *Deleted

## 2018-10-27 NOTE — Chronic Care Management (AMB) (Signed)
   Chronic Care Management   Unsuccessful Call Note 10/27/2018 Name: Caitlyn Marshall MRN: [id number] DOB: Apr 08, 1940  Patient  is a 78 year old female who sees Lelon Huh, MD for primary care. Dr. Caryn Section asked the CCM team to consult the patient for community resources to address her transportation needs.     This social worker was unable to reach patient via telephone today for follow up phone call as requested by patient. I have left HIPAA compliant voicemail asking patient to return my call. (unsuccessful outreach #3).   Plan: This Education officer, museum will cease all further calls to patient at this time, however well be happy to engage patient upon her return call.      Elliot Gurney, [place] Worker  [facility] Care Management [phone]

## 2018-11-11 DIAGNOSIS — E039 Hypothyroidism, unspecified: Secondary | ICD-10-CM | POA: Diagnosis not present

## 2018-11-11 DIAGNOSIS — E559 Vitamin D deficiency, unspecified: Secondary | ICD-10-CM | POA: Diagnosis not present

## 2018-11-11 DIAGNOSIS — N183 Chronic kidney disease, stage 3 (moderate): Secondary | ICD-10-CM | POA: Diagnosis not present

## 2018-11-11 DIAGNOSIS — E1022 Type 1 diabetes mellitus with diabetic chronic kidney disease: Secondary | ICD-10-CM | POA: Diagnosis not present

## 2018-11-13 ENCOUNTER — Other Ambulatory Visit: Payer: Self-pay | Admitting: Family Medicine

## 2018-11-18 DIAGNOSIS — Z794 Long term (current) use of insulin: Secondary | ICD-10-CM | POA: Diagnosis not present

## 2018-11-18 DIAGNOSIS — E1022 Type 1 diabetes mellitus with diabetic chronic kidney disease: Secondary | ICD-10-CM | POA: Diagnosis not present

## 2018-11-18 DIAGNOSIS — E039 Hypothyroidism, unspecified: Secondary | ICD-10-CM | POA: Diagnosis not present

## 2018-11-18 DIAGNOSIS — N183 Chronic kidney disease, stage 3 (moderate): Secondary | ICD-10-CM | POA: Diagnosis not present

## 2018-11-18 DIAGNOSIS — E559 Vitamin D deficiency, unspecified: Secondary | ICD-10-CM | POA: Diagnosis not present

## 2018-12-14 ENCOUNTER — Telehealth: Payer: Self-pay | Admitting: *Deleted

## 2018-12-14 NOTE — Telephone Encounter (Signed)
[facility] called office to inform us that patient has not had a diabetic eye exam since 2019. Patient has only been seen in their office for cataracts since. Patient is scheduled for her next diabetic eye exam 02/2019.

## 2018-12-18 ENCOUNTER — Encounter: Payer: Self-pay | Admitting: Family Medicine

## 2019-01-07 DIAGNOSIS — N189 Chronic kidney disease, unspecified: Secondary | ICD-10-CM | POA: Diagnosis not present

## 2019-01-07 DIAGNOSIS — N2581 Secondary hyperparathyroidism of renal origin: Secondary | ICD-10-CM | POA: Diagnosis not present

## 2019-01-07 DIAGNOSIS — E1122 Type 2 diabetes mellitus with diabetic chronic kidney disease: Secondary | ICD-10-CM | POA: Diagnosis not present

## 2019-01-07 DIAGNOSIS — E1022 Type 1 diabetes mellitus with diabetic chronic kidney disease: Secondary | ICD-10-CM | POA: Diagnosis not present

## 2019-01-07 DIAGNOSIS — I10 Essential (primary) hypertension: Secondary | ICD-10-CM | POA: Diagnosis not present

## 2019-01-07 DIAGNOSIS — N184 Chronic kidney disease, stage 4 (severe): Secondary | ICD-10-CM | POA: Diagnosis not present

## 2019-02-24 DIAGNOSIS — N183 Chronic kidney disease, stage 3 unspecified: Secondary | ICD-10-CM | POA: Diagnosis not present

## 2019-02-24 DIAGNOSIS — E1022 Type 1 diabetes mellitus with diabetic chronic kidney disease: Secondary | ICD-10-CM | POA: Diagnosis not present

## 2019-02-24 DIAGNOSIS — E559 Vitamin D deficiency, unspecified: Secondary | ICD-10-CM | POA: Diagnosis not present

## 2019-03-01 ENCOUNTER — Other Ambulatory Visit: Payer: Self-pay | Admitting: Family Medicine

## 2019-03-01 DIAGNOSIS — E7800 Pure hypercholesterolemia, unspecified: Secondary | ICD-10-CM

## 2019-03-01 NOTE — Telephone Encounter (Signed)
Requested medication (s) are due for refill today: yes  Requested medication (s) are on the active medication list: yes  Last refill:  12/03/2018  Future visit scheduled: no  Notes to clinic: review medication for refill Due for follow up   Requested Prescriptions  Pending Prescriptions Disp Refills   lovastatin (MEVACOR) 20 MG tablet [Pharmacy Med Name: LOVASTATIN 20MG  TABLETS] 90 tablet 3    Sig: TAKE 1 TABLET BY MOUTH DAILY      Cardiovascular:  Antilipid - Statins Failed - 03/01/2019  3:36 AM      Failed - Total Cholesterol in normal range and within 360 days    Cholesterol, Total  Date Value Ref Range Status  10/03/2016 131 100 - 199 mg/dL Final          Failed - LDL in normal range and within 360 days    LDL Calculated  Date Value Ref Range Status  10/03/2016 49 0 - 99 mg/dL Final          Failed - HDL in normal range and within 360 days    HDL  Date Value Ref Range Status  10/03/2016 65 >39 mg/dL Final          Failed - Triglycerides in normal range and within 360 days    Triglycerides  Date Value Ref Range Status  10/03/2016 87 0 - 149 mg/dL Final          Passed - Patient is not pregnant      Passed - Valid encounter within last 12 months    Recent Outpatient Visits           5 months ago Cough   [facility] Birdie Sons, MD   1 year ago Annual physical exam   [facility] Birdie Sons, MD   1 year ago Pneumonia of both lungs due to infectious organism, unspecified part of lung   [facility] Birdie Sons, MD   2 years ago Annual physical exam   [facility] Birdie Sons, MD   3 years ago [place] Margarita Rana, MD

## 2019-03-03 DIAGNOSIS — E039 Hypothyroidism, unspecified: Secondary | ICD-10-CM | POA: Diagnosis not present

## 2019-03-03 DIAGNOSIS — N1832 Chronic kidney disease, stage 3b: Secondary | ICD-10-CM | POA: Diagnosis not present

## 2019-03-03 DIAGNOSIS — I10 Essential (primary) hypertension: Secondary | ICD-10-CM | POA: Diagnosis not present

## 2019-03-03 DIAGNOSIS — E559 Vitamin D deficiency, unspecified: Secondary | ICD-10-CM | POA: Diagnosis not present

## 2019-03-03 DIAGNOSIS — E1021 Type 1 diabetes mellitus with diabetic nephropathy: Secondary | ICD-10-CM | POA: Diagnosis not present

## 2019-05-04 ENCOUNTER — Telehealth: Payer: Self-pay | Admitting: Family Medicine

## 2019-05-04 DIAGNOSIS — M542 Cervicalgia: Secondary | ICD-10-CM

## 2019-05-04 DIAGNOSIS — R221 Localized swelling, mass and lump, neck: Secondary | ICD-10-CM

## 2019-05-04 NOTE — Telephone Encounter (Signed)
I called patient back and advised her as below. Patient states she is going to see if another relative can bring her in on a different day. Patient will call back to schedule an appointment.   Patient gave me verbal permission to let NP Candice know that she will be following up with Dr. Caryn Section in the office. I tried [place], NP with [place] calls program to update and inform her that I have spoken with patient and patient plans to schedule a follow up appointment once she finds transportation. Left message for Candice to call back.

## 2019-05-04 NOTE — Telephone Encounter (Signed)
Pt returned call to schedule follow up/ Pt can only come using transportation on weds and thurs afternoons/ please advise

## 2019-05-04 NOTE — Telephone Encounter (Signed)
I called and spoke with Talmage Nap NP, with [facility]. She states she seen patient yesterday for her 1st visit in the program. Candice wanted to make Dr. Caryn Section aware of her findings below.  1) Patient's mini cognitive test score was 0/3. Patient was unable to draw the clock. Candice asked me if patient had a history of Dementia. I didn't disclose any information to Candice since there was no signed ROI.   2) Candice did and Quantification test to check for changes of peripheral artery disease (PAD). Patient scored 0.58 on the left and 0.74 on the right. Candice says that the normal range is 1.0.    3) Candice says that patient complained that for the past 3 months she has had intermittent nausea and vomiting. Her weight was 106 lbs. Candice wants to know if this could possibly be gastroparesis? She asked if Dr. Caryn Section wants to bring patient in for evaluation of these symptoms. Or if her is already aware of this?   Candice would like a call back from either Dr. Caryn Section of the CMA to discuss her finding and to see what Dr. Caryn Section recommends.

## 2019-05-04 NOTE — Telephone Encounter (Signed)
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 Patient has been advised of this on numerous occasions and referred for treatment, but she has repeatedly no showed when referred to pulmonary and oncology. She has refused any additional evaluation or treatment. She has also turned away [place]. She should either come in for follow up, or have labs drawn at home if she would allow home health to come in. Alternatively we could order Home Hospice. If she refuses everything then we will probably need to get Adult Protected Services involved.

## 2019-05-04 NOTE — Telephone Encounter (Signed)
Talmage Nap, NP with [facility] house calls,  requesting to speak with patients PCP or CMA to discuss "several issues" noticed during visit with patient yesterday. Candice would not disclose further information. Please advise.

## 2019-05-04 NOTE — Telephone Encounter (Signed)
I am not here on Wednesday or Thursday afternoons. She will either need to come a different day or see a PA

## 2019-05-04 NOTE — Telephone Encounter (Signed)
Is it ok for me to give information to NP?   I tried calling patient to schedule follow up appointment. Left message to call back.

## 2019-05-07 NOTE — Telephone Encounter (Signed)
I tried calling patient on her home phone. No answer. I called the mobile number and patient's daughter [name] answered the phone [name] is on her DPR). I asked [name] if her mom was able to find transportation to schedule an office visit. [name] was unsure, but told me that she would have her mom give [place] a call to schedule an appointment.

## 2019-05-07 NOTE — Telephone Encounter (Signed)
Patient returned call and advised me that she has not asked her niece about bringing her in for an appointment. She states her niece is her only transportation right now. Patient states she is going to talk to her niece and call back to schedule an appointment next week. I advised her that she may have to schedule an appointment with one of the other providers here at [facility] since her transportation is only available during the days Dr. Caryn Section is out of the office. Patient verbalized understanding and agreed to call back once she spoke with her niece.

## 2019-05-13 NOTE — Addendum Note (Signed)
Addended by: Birdie Sons on: 05/13/2019 07:49 AM   Modules accepted: Orders

## 2019-05-17 ENCOUNTER — Telehealth: Payer: Self-pay | Admitting: Family Medicine

## 2019-05-17 ENCOUNTER — Telehealth: Payer: Self-pay

## 2019-05-17 NOTE — Telephone Encounter (Signed)
Pt son is at 52 with head injury and pt daughter would like the doctor to prescribed something for her mom nerves. Periactin is not helping with her appetite. Pt daughter thinks her mother has first stage dementia and would like Aricept. [facility] [address]

## 2019-05-17 NOTE — Telephone Encounter (Signed)
Copied from [place] [id number]. Topic: Appointment Scheduling - Scheduling Inquiry for Clinic >> May 17, 2019 10:45 AM Caitlyn Marshall wrote: Reason for CRM:pt daughter is calling her mother is losing weight not eating having diarrhea.  Pt daughter will be in town and would like her mother to see dr Caitlyn Marshall  05-31-2019. Pt daughter said her mother keeps making excuses as to why she can not come to md office due to transportation

## 2019-05-18 NOTE — Telephone Encounter (Signed)
Patient schedule on 05/31/2019 to discuss symptoms in previous telephone message.

## 2019-05-18 NOTE — Telephone Encounter (Signed)
Patient schedule on 05/31/2019 to address concerns

## 2019-05-27 DIAGNOSIS — E1021 Type 1 diabetes mellitus with diabetic nephropathy: Secondary | ICD-10-CM | POA: Diagnosis not present

## 2019-05-27 DIAGNOSIS — E559 Vitamin D deficiency, unspecified: Secondary | ICD-10-CM | POA: Diagnosis not present

## 2019-05-27 DIAGNOSIS — N1832 Chronic kidney disease, stage 3b: Secondary | ICD-10-CM | POA: Diagnosis not present

## 2019-05-27 DIAGNOSIS — I10 Essential (primary) hypertension: Secondary | ICD-10-CM | POA: Diagnosis not present

## 2019-05-29 ENCOUNTER — Other Ambulatory Visit: Payer: Self-pay | Admitting: Family Medicine

## 2019-05-29 DIAGNOSIS — J309 Allergic rhinitis, unspecified: Secondary | ICD-10-CM

## 2019-05-29 NOTE — Telephone Encounter (Signed)
Requested Prescriptions  Pending Prescriptions Disp Refills  . montelukast (SINGULAIR) 10 MG tablet [Pharmacy Med Name: MONTELUKAST 10MG  TABLETS] 90 tablet 4    Sig: TAKE 1 TABLET BY MOUTH EVERY DAY     Pulmonology:  Leukotriene Inhibitors Passed - 05/29/2019  9:28 AM      Passed - Valid encounter within last 12 months    Recent Outpatient Visits          8 months ago Cough   [facility] Birdie Sons, MD   1 year ago Annual physical exam   [facility] Birdie Sons, MD   2 years ago Pneumonia of both lungs due to infectious organism, unspecified part of lung   [facility] Birdie Sons, MD   2 years ago Annual physical exam   [facility] Birdie Sons, MD   3 years ago [place], MD      Future Appointments            In 2 days Fisher, Kirstie Peri, MD [facility], [place]

## 2019-05-31 ENCOUNTER — Other Ambulatory Visit: Payer: Self-pay

## 2019-05-31 ENCOUNTER — Encounter: Payer: Self-pay | Admitting: Family Medicine

## 2019-05-31 ENCOUNTER — Ambulatory Visit (INDEPENDENT_AMBULATORY_CARE_PROVIDER_SITE_OTHER): Payer: Medicare Other | Admitting: Family Medicine

## 2019-05-31 VITALS — BP 130/52 | HR 85 | Temp 97.1°F | Resp 16 | Wt 100.8 lb

## 2019-05-31 DIAGNOSIS — F439 Reaction to severe stress, unspecified: Secondary | ICD-10-CM

## 2019-05-31 DIAGNOSIS — E1029 Type 1 diabetes mellitus with other diabetic kidney complication: Secondary | ICD-10-CM

## 2019-05-31 DIAGNOSIS — R413 Other amnesia: Secondary | ICD-10-CM | POA: Diagnosis not present

## 2019-05-31 DIAGNOSIS — N289 Disorder of kidney and ureter, unspecified: Secondary | ICD-10-CM | POA: Diagnosis not present

## 2019-05-31 DIAGNOSIS — R911 Solitary pulmonary nodule: Secondary | ICD-10-CM

## 2019-05-31 DIAGNOSIS — R630 Anorexia: Secondary | ICD-10-CM

## 2019-05-31 DIAGNOSIS — R1010 Upper abdominal pain, unspecified: Secondary | ICD-10-CM | POA: Diagnosis not present

## 2019-05-31 DIAGNOSIS — N1832 Chronic kidney disease, stage 3b: Secondary | ICD-10-CM | POA: Diagnosis not present

## 2019-05-31 DIAGNOSIS — E039 Hypothyroidism, unspecified: Secondary | ICD-10-CM

## 2019-05-31 MED ORDER — MEGESTROL ACETATE 20 MG PO TABS: 20.0000 mg | ORAL_TABLET | Freq: Every day | ORAL | 5 refills | Status: AC

## 2019-05-31 MED ORDER — DONEPEZIL HCL 5 MG PO TABS: 5.0000 mg | ORAL_TABLET | Freq: Every day | ORAL | 4 refills | Status: DC

## 2019-05-31 MED ORDER — ALPRAZOLAM 0.25 MG PO TABS: 0.2500 mg | ORAL_TABLET | ORAL | 1 refills | Status: DC | PRN

## 2019-05-31 NOTE — Progress Notes (Signed)
Patient: Caitlyn Marshall   DOB: Jan 12, 1941   79 y.o. Female  MRN: [id number] Visit Date: 05/31/2019  Today's Provider: Lelon Huh, MD  Subjective:    Chief Complaint  Patient presents with  . Abdominal Pain   GI Problem The primary symptoms include weight loss, abdominal pain (abdominal cramping ), vomiting and diarrhea. Primary symptoms do not include fatigue. Episode onset: 6 months or more. The onset was gradual.  Associated symptoms comments: Loss of appetite .   She does have history of multiple concerning lung nodules for which she has been referred to oncology and pulmonology on multiple occasional but she has not followed through with referral. She has no immediate family that lives in town and she has very limited transportation. Her daughter is currently in town due to recent death in family and is at her visit today. She states that patient does have a niece in town that would be available to transport the patient to doctors's visits on Wednesday's and Thursdays.   Her daughter also reports the Ms Caitlyn Marshall has been very anxious lately, particularly since death in the family and feels like she needs something to help with her nerves.       Medications: Outpatient Medications Prior to Visit  Medication Sig Dispense Refill  . allopurinol (ZYLOPRIM) 100 MG tablet TAKE 2 TABLETS(200 MG) BY MOUTH TWICE DAILY 360 tablet 3  . amLODipine (NORVASC) 10 MG tablet Take 10 mg by mouth daily.    Marland Kitchen aspirin EC 81 MG tablet Take 81 mg by mouth daily.     . cyproheptadine (PERIACTIN) 4 MG tablet Start 1/2 tablet three times daily for one week, then increase to 1 tablet three times daily (Patient taking differently: Take 4 mg by mouth 3 (three) times daily. Start 1/2 tablet three times daily for one week, then increase to 1 tablet three times daily) 90 tablet 1  . hydrochlorothiazide (HYDRODIURIL) 12.5 MG tablet TK 1 T PO QAM UTD FOR LEG SWELLING  11  . insulin NPH Human (NOVOLIN N)  100 UNIT/ML injection Inject 6-7 Units into the skin 2 (two) times daily before a meal.     . insulin regular (NOVOLIN R,HUMULIN R) 100 units/mL injection Inject 5-9 Units into the skin 3 (three) times daily before meals. 6 units in AM, 8 units at lunch and 10 units in PM    . Insulin Syringe-Needle U-100 (INSULIN SYRINGE .5CC/30GX5/16") 30G X 5/16" 0.5 ML MISC U QID UTD  5  . irbesartan (AVAPRO) 300 MG tablet Take 300 mg by mouth every evening.  8  . levothyroxine (SYNTHROID, LEVOTHROID) 50 MCG tablet Take 1 tablet by mouth daily.  5  . lovastatin (MEVACOR) 20 MG tablet TAKE 1 TABLET BY MOUTH DAILY 90 tablet 3  . montelukast (SINGULAIR) 10 MG tablet TAKE 1 TABLET BY MOUTH EVERY DAY 90 tablet 4  . ondansetron (ZOFRAN) 4 MG tablet Take 1 tablet by mouth every 8 (eight) hours as needed. For nausea    . ONE TOUCH ULTRA TEST test strip TEST THREE TIMES DAILY 100 each 5  . ONETOUCH DELICA LANCETS 27O MISC INJECT AS DIRECTED THREE TIMES DAILY    . potassium chloride SA (K-DUR,KLOR-CON) 20 MEQ tablet Take 1 tablet by mouth daily.  2  . torsemide (DEMADEX) 20 MG tablet Take 20 mg by mouth daily.      No facility-administered medications prior to visit.    Review of Systems  Constitutional: Positive for appetite  change, unexpected weight change and weight loss. Negative for fatigue.  Respiratory: Negative.   Cardiovascular: Negative.   Gastrointestinal: Positive for abdominal pain (abdominal cramping ), diarrhea and vomiting.  Musculoskeletal: Negative.   Neurological:       Memory loss   Psychiatric/Behavioral: The patient is nervous/anxious (since son had accident).          Objective:    BP (!) 130/52 (BP Location: Left Arm, Patient Position: Sitting, Cuff Size: Normal)   Pulse 85   Temp (!) 97.1 F (36.2 C) (Temporal)   Resp 16   Wt 100 lb 12.8 oz (45.7 kg)   SpO2 99% Comment: room air  BMI 16.27 kg/m    Physical Exam    General: Appearance:    Thin female in no acute  distress  Eyes:    PERRL, conjunctiva/corneas clear, EOM's intact       Lungs:     Clear to auscultation bilaterally, respirations unlabored  Heart:    Normal heart rate. Normal rhythm. No murmurs, rubs, or gallops.   MS:   All extremities are intact.   Neurologic:   Awake, alert, oriented x 3. No apparent focal neurological           defect.           Assessment & Plan:    1. Anorexia In the setting of multiple suspicious lung nodules. Will start- megestrol (MEGACE) 20 MG tablet; Take 1 tablet (20 mg total) by mouth daily.  Dispense: 30 tablet; Refill: 5  2. Renal insufficiency  - CBC - CT Abdomen Pelvis Wo Contrast; Future  3. Memory difficulties  - donepezil (ARICEPT) 5 MG tablet; Take 1 tablet (5 mg total) by mouth at bedtime.  Dispense: 30 tablet; Refill: 4  4. Solitary pulmonary nodule  - CT CHEST LCS NODULE F/U WO  CONTRAST; Future  5. Pain of upper abdomen  - CT Abdomen Pelvis Wo Contrast; Future  6. Stage 3b chronic kidney disease  - CBC - Comprehensive metabolic panel  7. 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 Hypothyroidism, unspecified type  - TSH  9. Situational stress  - ALPRAZolam (XANAX) 0.25 MG tablet; Take 1 tablet (0.25 mg total) by mouth every 4 (four) hours as needed.  Dispense: 30 tablet; Refill: [place], MD  [facility] [phone] (phone) [phone] (fax)  [place]

## 2019-06-01 LAB — COMPREHENSIVE METABOLIC PANEL
ALT: 8 IU/L (ref 0–32)
AST: 17 IU/L (ref 0–40)
Albumin/Globulin Ratio: 1.6 (ref 1.2–2.2)
Albumin: 4.1 g/dL (ref 3.7–4.7)
Alkaline Phosphatase: 127 IU/L — ABNORMAL HIGH (ref 39–117)
BUN/Creatinine Ratio: 7 — ABNORMAL LOW (ref 12–28)
BUN: 16 mg/dL (ref 8–27)
Bilirubin Total: 0.4 mg/dL (ref 0.0–1.2)
CO2: 23 mmol/L (ref 20–29)
Calcium: 9.2 mg/dL (ref 8.7–10.3)
Chloride: 104 mmol/L (ref 96–106)
Creatinine, Ser: 2.16 mg/dL — ABNORMAL HIGH (ref 0.57–1.00)
GFR calc Af Amer: 25 mL/min/{1.73_m2} — ABNORMAL LOW (ref >59)
GFR calc non Af Amer: 21 mL/min/{1.73_m2} — ABNORMAL LOW (ref >59)
Globulin, Total: 2.5 g/dL (ref 1.5–4.5)
Glucose: 116 mg/dL — ABNORMAL HIGH (ref 65–99)
Potassium: 3 mmol/L — ABNORMAL LOW (ref 3.5–5.2)
Sodium: 146 mmol/L — ABNORMAL HIGH (ref 134–144)
Total Protein: 6.6 g/dL (ref 6.0–8.5)

## 2019-06-01 LAB — CBC
Hematocrit: 34.5 % (ref 34.0–46.6)
Hemoglobin: 10.4 g/dL — ABNORMAL LOW (ref 11.1–15.9)
MCH: 24.3 pg — ABNORMAL LOW (ref 26.6–33.0)
MCHC: 30.1 g/dL — ABNORMAL LOW (ref 31.5–35.7)
MCV: 81 fL (ref 79–97)
Platelets: 285 10*3/uL (ref 150–450)
RBC: 4.28 x10E6/uL (ref 3.77–5.28)
RDW: 16.8 % — ABNORMAL HIGH (ref 11.7–15.4)
WBC: 10.3 10*3/uL (ref 3.4–10.8)

## 2019-06-01 LAB — HEMOGLOBIN A1C
Est. average glucose Bld gHb Est-mCnc: 160 mg/dL
Hgb A1c MFr Bld: 7.2 % — ABNORMAL HIGH (ref 4.8–5.6)

## 2019-06-01 LAB — TSH: TSH: 2.62 u[IU]/mL (ref 0.450–4.500)

## 2019-06-02 DIAGNOSIS — E109 Type 1 diabetes mellitus without complications: Secondary | ICD-10-CM | POA: Diagnosis not present

## 2019-06-02 DIAGNOSIS — N184 Chronic kidney disease, stage 4 (severe): Secondary | ICD-10-CM | POA: Diagnosis not present

## 2019-06-02 DIAGNOSIS — E1059 Type 1 diabetes mellitus with other circulatory complications: Secondary | ICD-10-CM | POA: Diagnosis not present

## 2019-06-02 DIAGNOSIS — Z794 Long term (current) use of insulin: Secondary | ICD-10-CM | POA: Diagnosis not present

## 2019-06-02 DIAGNOSIS — E039 Hypothyroidism, unspecified: Secondary | ICD-10-CM | POA: Diagnosis not present

## 2019-06-14 ENCOUNTER — Telehealth: Payer: Self-pay

## 2019-06-14 DIAGNOSIS — F439 Reaction to severe stress, unspecified: Secondary | ICD-10-CM

## 2019-06-14 MED ORDER — ALPRAZOLAM 0.25 MG PO TABS: 0.2500 mg | ORAL_TABLET | Freq: Four times a day (QID) | ORAL | 1 refills | Status: DC | PRN

## 2019-06-14 NOTE — Telephone Encounter (Signed)
Please review. The PA for Alprazolam was denied.

## 2019-06-14 NOTE — Telephone Encounter (Signed)
Copied from [place] [id number]. Topic: Quick Communication - See Telephone Encounter >> Jun 14, 2019 11:50 AM Loma [place] wrote: CRM for notification. See Telephone encounter for: 06/14/19. Caren Griffins from [facility] just called on a med denial forALPRAZolam (XANAX) 0.25 MG tablet [[id number]]. She is sending over a fax and was willing to just fax over request if note was taken. Additionally fax # was also verified for pt.

## 2019-06-14 NOTE — Telephone Encounter (Signed)
Santiago Glad is calling from [facility] regarding the PA. Santiago Glad is wanting to know is the patient still getting 6 pills per day? If she she is getting more than 4 per day what is the reason? Because on the fax it is noted 4 per day. But the pharmacy is billing as 6 per day. Trying to clarify. Or what reason why she could not use the 0.5 dosage. Please advise CB- [phone] direct line.

## 2019-06-14 NOTE — Telephone Encounter (Signed)
I recent prescription and changed sig to Q6 prn, which is no more than 4 a day

## 2019-06-14 NOTE — Telephone Encounter (Signed)
Spoke with Santiago Glad for [facility] and clarified sig .KW

## 2019-06-14 NOTE — Addendum Note (Signed)
Addended by: Birdie Sons on: 06/14/2019 04:03 PM   Modules accepted: Orders

## 2019-06-15 ENCOUNTER — Other Ambulatory Visit: Payer: Self-pay

## 2019-06-15 ENCOUNTER — Ambulatory Visit
Admission: RE | Admit: 2019-06-15 | Discharge: 2019-06-15 | Disposition: A | Discharge status: Home / Self Care | Payer: Medicare Other | Source: Ambulatory Visit | Attending: Family Medicine | Admitting: Family Medicine

## 2019-06-15 DIAGNOSIS — J90 Pleural effusion, not elsewhere classified: Secondary | ICD-10-CM | POA: Diagnosis not present

## 2019-06-15 DIAGNOSIS — J9811 Atelectasis: Secondary | ICD-10-CM | POA: Diagnosis not present

## 2019-06-15 DIAGNOSIS — R1010 Upper abdominal pain, unspecified: Secondary | ICD-10-CM | POA: Diagnosis not present

## 2019-06-15 DIAGNOSIS — R911 Solitary pulmonary nodule: Secondary | ICD-10-CM

## 2019-06-15 DIAGNOSIS — N289 Disorder of kidney and ureter, unspecified: Secondary | ICD-10-CM

## 2019-06-15 DIAGNOSIS — J439 Emphysema, unspecified: Secondary | ICD-10-CM | POA: Diagnosis not present

## 2019-06-15 DIAGNOSIS — N200 Calculus of kidney: Secondary | ICD-10-CM | POA: Diagnosis not present

## 2019-06-20 ENCOUNTER — Other Ambulatory Visit: Payer: Self-pay | Admitting: Family Medicine

## 2019-06-20 DIAGNOSIS — D491 Neoplasm of unspecified behavior of respiratory system: Secondary | ICD-10-CM

## 2019-06-23 ENCOUNTER — Encounter: Payer: Self-pay | Admitting: *Deleted

## 2019-06-23 DIAGNOSIS — R911 Solitary pulmonary nodule: Secondary | ICD-10-CM

## 2019-06-24 ENCOUNTER — Telehealth: Payer: Self-pay | Admitting: Oncology

## 2019-06-24 ENCOUNTER — Other Ambulatory Visit: Payer: Self-pay | Admitting: Oncology

## 2019-06-24 NOTE — Telephone Encounter (Signed)
Patient is a  79 year old female who had CT scan on June 15, 2019 which noted an enlarged node anterior to the carina measuring 2.5 cm.  Also noted was a new small to moderate left pleural effusion and adjacent atelectasis. There is a masslike consolidation within the lingula including the area of previously seen spiculated mass.  She has a history of tobacco use and had unintentional weight loss.     Patient has been referred to the [facility] and will require a PET scan for further evaluation and to optimize management.  PET scan is also needed to assist in further diagnostic studies including possible biopsy.

## 2019-06-25 ENCOUNTER — Telehealth: Payer: Self-pay | Admitting: Family Medicine

## 2019-06-25 ENCOUNTER — Other Ambulatory Visit: Payer: Self-pay

## 2019-06-25 ENCOUNTER — Emergency Department: Payer: Medicare Other

## 2019-06-25 ENCOUNTER — Emergency Department
Admission: EM | Admit: 2019-06-25 | Discharge: 2019-06-25 | Disposition: A | Discharge status: Home / Self Care | Payer: Medicare Other | Attending: Emergency Medicine | Admitting: Emergency Medicine

## 2019-06-25 ENCOUNTER — Encounter: Payer: Self-pay | Admitting: Emergency Medicine

## 2019-06-25 DIAGNOSIS — R911 Solitary pulmonary nodule: Secondary | ICD-10-CM | POA: Diagnosis not present

## 2019-06-25 DIAGNOSIS — Z7982 Long term (current) use of aspirin: Secondary | ICD-10-CM | POA: Insufficient documentation

## 2019-06-25 DIAGNOSIS — Z794 Long term (current) use of insulin: Secondary | ICD-10-CM | POA: Insufficient documentation

## 2019-06-25 DIAGNOSIS — R8271 Bacteriuria: Secondary | ICD-10-CM | POA: Diagnosis not present

## 2019-06-25 DIAGNOSIS — F0281 Dementia in other diseases classified elsewhere with behavioral disturbance: Secondary | ICD-10-CM | POA: Diagnosis not present

## 2019-06-25 DIAGNOSIS — J90 Pleural effusion, not elsewhere classified: Secondary | ICD-10-CM | POA: Diagnosis not present

## 2019-06-25 DIAGNOSIS — G309 Alzheimer's disease, unspecified: Secondary | ICD-10-CM | POA: Diagnosis not present

## 2019-06-25 DIAGNOSIS — N183 Chronic kidney disease, stage 3 unspecified: Secondary | ICD-10-CM | POA: Diagnosis not present

## 2019-06-25 DIAGNOSIS — I129 Hypertensive chronic kidney disease with stage 1 through stage 4 chronic kidney disease, or unspecified chronic kidney disease: Secondary | ICD-10-CM | POA: Diagnosis not present

## 2019-06-25 DIAGNOSIS — Z743 Need for continuous supervision: Secondary | ICD-10-CM | POA: Diagnosis not present

## 2019-06-25 DIAGNOSIS — Z79899 Other long term (current) drug therapy: Secondary | ICD-10-CM | POA: Diagnosis not present

## 2019-06-25 DIAGNOSIS — I10 Essential (primary) hypertension: Secondary | ICD-10-CM | POA: Diagnosis not present

## 2019-06-25 DIAGNOSIS — E039 Hypothyroidism, unspecified: Secondary | ICD-10-CM | POA: Diagnosis not present

## 2019-06-25 DIAGNOSIS — E1122 Type 2 diabetes mellitus with diabetic chronic kidney disease: Secondary | ICD-10-CM | POA: Diagnosis not present

## 2019-06-25 DIAGNOSIS — F0391 Unspecified dementia with behavioral disturbance: Secondary | ICD-10-CM

## 2019-06-25 DIAGNOSIS — R1110 Vomiting, unspecified: Secondary | ICD-10-CM | POA: Insufficient documentation

## 2019-06-25 DIAGNOSIS — Z87891 Personal history of nicotine dependence: Secondary | ICD-10-CM | POA: Diagnosis not present

## 2019-06-25 DIAGNOSIS — R4182 Altered mental status, unspecified: Secondary | ICD-10-CM | POA: Diagnosis present

## 2019-06-25 DIAGNOSIS — R404 Transient alteration of awareness: Secondary | ICD-10-CM | POA: Diagnosis not present

## 2019-06-25 LAB — URINALYSIS, COMPLETE (UACMP) WITH MICROSCOPIC
Bilirubin Urine: NEGATIVE
Glucose, UA: NEGATIVE mg/dL
Ketones, ur: 5 mg/dL — AB
Leukocytes,Ua: NEGATIVE
Nitrite: NEGATIVE
Protein, ur: >=300 mg/dL — AB
Specific Gravity, Urine: 1.017 (ref 1.005–1.030)
pH: 5 (ref 5.0–8.0)

## 2019-06-25 LAB — COMPREHENSIVE METABOLIC PANEL
ALT: 10 U/L (ref 0–44)
AST: 15 U/L (ref 15–41)
Albumin: 4 g/dL (ref 3.5–5.0)
Alkaline Phosphatase: 109 U/L (ref 38–126)
Anion gap: 13 (ref 5–15)
BUN: 17 mg/dL (ref 8–23)
CO2: 24 mmol/L (ref 22–32)
Calcium: 9.7 mg/dL (ref 8.9–10.3)
Chloride: 105 mmol/L (ref 98–111)
Creatinine, Ser: 1.88 mg/dL — ABNORMAL HIGH (ref 0.44–1.00)
GFR calc Af Amer: 29 mL/min — ABNORMAL LOW (ref >60)
GFR calc non Af Amer: 25 mL/min — ABNORMAL LOW (ref >60)
Glucose, Bld: 152 mg/dL — ABNORMAL HIGH (ref 70–99)
Potassium: 3.4 mmol/L — ABNORMAL LOW (ref 3.5–5.1)
Sodium: 142 mmol/L (ref 135–145)
Total Bilirubin: 1.5 mg/dL — ABNORMAL HIGH (ref 0.3–1.2)
Total Protein: 7.8 g/dL (ref 6.5–8.1)

## 2019-06-25 LAB — CBC
HCT: 42.7 % (ref 36.0–46.0)
Hemoglobin: 13.6 g/dL (ref 12.0–15.0)
MCH: 24.5 pg — ABNORMAL LOW (ref 26.0–34.0)
MCHC: 31.9 g/dL (ref 30.0–36.0)
MCV: 76.9 fL — ABNORMAL LOW (ref 80.0–100.0)
Platelets: 283 10*3/uL (ref 150–400)
RBC: 5.55 MIL/uL — ABNORMAL HIGH (ref 3.87–5.11)
RDW: 20.1 % — ABNORMAL HIGH (ref 11.5–15.5)
WBC: 10.5 10*3/uL (ref 4.0–10.5)
nRBC: 0 % (ref 0.0–0.2)

## 2019-06-25 LAB — TSH: TSH: 4.993 u[IU]/mL — ABNORMAL HIGH (ref 0.350–4.500)

## 2019-06-25 LAB — LACTIC ACID, PLASMA: Lactic Acid, Venous: 1.3 mmol/L (ref 0.5–1.9)

## 2019-06-25 LAB — PROTIME-INR
INR: 1.1 (ref 0.8–1.2)
Prothrombin Time: 13.6 seconds (ref 11.4–15.2)

## 2019-06-25 MED ORDER — AMLODIPINE BESYLATE 5 MG PO TABS
10.0000 mg | ORAL_TABLET | Freq: Once | ORAL | Status: AC
  Administered 2019-06-25: 11:00:00 10 mg via ORAL
  Filled 2019-06-25: qty 2

## 2019-06-25 MED ORDER — SODIUM CHLORIDE 0.9% FLUSH: 3.0000 mL | Freq: Once | INTRAVENOUS | Status: DC

## 2019-06-25 MED ORDER — FOSFOMYCIN TROMETHAMINE 3 G PO PACK
3.0000 g | PACK | ORAL | Status: AC
  Administered 2019-06-25: 13:00:00 3 g via ORAL
  Filled 2019-06-25: qty 3

## 2019-06-25 NOTE — ED Triage Notes (Signed)
Pt to ED via [facility] from home for altered mental status. Per EMS, pt  Family states that pt has not been acting like herself the past few days, has been taking her clothes off and walking around the house. Per EMS pt has a strong urine odor Pt has not been eating or drinking like normal.   [id number]

## 2019-06-25 NOTE — ED Provider Notes (Signed)
[facility] Emergency Department Provider Note   ____________________________________________   First MD Initiated Contact with Patient 06/25/19 1040     (approximate)  I have reviewed the triage vital signs and the nursing notes.   HISTORY  Chief Complaint Altered Mental Status    HPI Caitlyn Marshall is a 79 y.o. female history chronic kidney disease diabetes hypertension hypothyroidism  Patient comes in today with her family, her foster sister is with her.  Reports that family including her son are at home and care for her.  Today they noticed that she was up in the home and did not have her clothes on.  She does have dementia and, sometimes she will have abnormal behaviors.  She was not threatening or trying to harm anyone but they report that she just seemed a little more confused than her typical.  They want her evaluated, port in the past she has had issues with things like urinary tract infections have caused some confusion.    Patient herself has no complaint.  Reports she would like to be able to go home.  She knows she is at the hospital, and she relates a history that she supposed to have a chest x-ray her doctor has not ordered it yet, and her family the bedside also affirm the same.  Did like to have this to follow-up on a "nodule"  She has not yet had her medications today.  Past Medical History:  Diagnosis Date  . Chronic kidney disease   . Diabetes mellitus without complication ([place])   . Gout   . Hyperlipidemia   . Hypertension   . Hypothyroidism   . Thyroid disease     Patient Active Problem List   Diagnosis Date Noted  . Diabetic retinopathy ([place]) 02/23/2018  . Nodule of lower lobe of left lung 04/17/2017  . Insomnia 07/12/2015  . Hypertension 07/12/2015  . Poor appetite 07/12/2015  . Constipation 07/12/2015  . Bursitis 05/31/2015  . 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 Leg  swelling 05/31/2015  . Allergic rhinitis 12/26/2014  . Vitamin D deficiency 12/20/2014  . Anxiety 12/20/2014  . Hypercholesterolemia 12/20/2014  . Anemia 12/20/2014  . Hypothyroidism 12/20/2014  . Hypertensive CKD (chronic kidney disease) 12/20/2014  . Premature ventricular contraction 12/20/2014  . PVD (peripheral vascular disease) ([place]) 12/20/2014  . Gout 12/20/2014  . Osteoarthritis 12/20/2014  . Renal insufficiency 12/20/2014  . Chronic kidney disease (CKD), stage III (moderate) 12/20/2014    Past Surgical History:  Procedure Laterality Date  . CATARACT EXTRACTION W/PHACO Left 05/26/2018   Procedure: CATARACT EXTRACTION PHACO AND INTRAOCULAR LENS PLACEMENT ([place])  LEFT DIABETIC;  Surgeon: Leandrew Koyanagi, MD;  Location: [place];  Service: Ophthalmology;  Laterality: Left;  Diabetic - insulin  . TUBAL LIGATION    . Vascular Stent in right leg      Prior to Admission medications   Medication Sig Start Date End Date Taking? Authorizing Provider  allopurinol (ZYLOPRIM) 100 MG tablet TAKE 2 TABLETS(200 MG) BY MOUTH TWICE DAILY 11/13/18   Birdie Sons, MD  ALPRAZolam Duanne Moron) 0.25 MG tablet Take 1 tablet (0.25 mg total) by mouth every 6 (six) hours as needed. 06/14/19   Birdie Sons, MD  amLODipine (NORVASC) 10 MG tablet Take 10 mg by mouth daily. 12/21/14   [provider]  aspirin EC 81 MG tablet Take 81 mg by mouth daily.     [provider]  cyproheptadine (PERIACTIN) 4 MG tablet Start 1/2 tablet three times daily for one week, then increase to 1 tablet three times daily Patient taking differently: Take 4 mg by mouth 3 (three) times daily. Start 1/2 tablet three times daily for one week, then increase to 1 tablet three times daily 09/07/18   Birdie Sons, MD  donepezil (ARICEPT) 5 MG tablet Take 1 tablet (5 mg total) by mouth at bedtime. 05/31/19   Birdie Sons, MD  hydrochlorothiazide (HYDRODIURIL) 12.5 MG tablet TK 1 T PO QAM UTD FOR LEG  SWELLING 11/16/14   [provider]  insulin NPH Human (NOVOLIN N) 100 UNIT/ML injection Inject 6-7 Units into the skin 2 (two) times daily before a meal.     [provider]  insulin regular (NOVOLIN R,HUMULIN R) 100 units/mL injection Inject 5-9 Units into the skin 3 (three) times daily before meals. 6 units in AM, 8 units at lunch and 10 units in PM    [provider]  Insulin Syringe-Needle U-100 (INSULIN SYRINGE .5CC/30GX5/16") 30G X 5/16" 0.5 ML MISC U QID UTD 09/13/14   [provider]  irbesartan (AVAPRO) 300 MG tablet Take 300 mg by mouth every evening. 01/02/17   [provider]  levothyroxine (SYNTHROID, LEVOTHROID) 50 MCG tablet Take 1 tablet by mouth daily. 10/21/14   [provider]  lovastatin (MEVACOR) 20 MG tablet TAKE 1 TABLET BY MOUTH DAILY 03/01/19   Birdie Sons, MD  megestrol (MEGACE) 20 MG tablet Take 1 tablet (20 mg total) by mouth daily. 05/31/19   Birdie Sons, MD  montelukast (SINGULAIR) 10 MG tablet TAKE 1 TABLET BY MOUTH EVERY DAY 05/29/19   Birdie Sons, MD  ondansetron (ZOFRAN) 4 MG tablet Take 1 tablet by mouth every 8 (eight) hours as needed. For nausea 08/12/18   [provider]  ONE TOUCH ULTRA TEST test strip TEST THREE TIMES DAILY 09/06/14   Jerrol Banana., MD  [facility] DELICA LANCETS 41Y MISC INJECT AS DIRECTED THREE TIMES DAILY 11/30/14   [provider]  potassium chloride SA (K-DUR,KLOR-CON) 20 MEQ tablet Take 1 tablet by mouth daily. 09/16/17   [provider]  torsemide (DEMADEX) 20 MG tablet Take 20 mg by mouth daily.  10/08/18   [provider]    Allergies Patient has no known allergies.  Family History  Problem Relation Age of Onset  . Hypertension Mother   . Diabetes Mother        Diabetes mellitus Type 2  . Alzheimer's disease Mother   . Cancer Son        died at age 36, esophagus    Social History Social History   Tobacco Use  . Smoking  status: Former Smoker    Packs/day: 0.50    Years: 30.00    Pack years: 15.00    Types: Cigarettes    Quit date: 03/18/2007    Years since quitting: 12.2  . Smokeless tobacco: Never Used  . Tobacco comment: Quit smoking in 2008  Substance Use Topics  . Alcohol use: No    Alcohol/week: 0.0 standard drinks  . Drug use: No    Review of Systems Constitutional: No fever/chills or recent illness Eyes: No visual changes. ENT: No neck pain Cardiovascular: Denies chest pain. Respiratory: Denies shortness of breath. Gastrointestinal: No abdominal pain.  She does occasionally throw up.  Family reports has been issues ongoing for a few months.  Nothing new.  Patient denies any abdominal pain  nausea or symptoms.  She like to get something to eat, but reports she wants to eat it at home not here. Genitourinary: Negative for dysuria. Musculoskeletal: Negative for back pain. Skin: Negative for rash. Neurological: Negative for headaches, areas of focal weakness or numbness.    ____________________________________________   PHYSICAL EXAM:  VITAL SIGNS: ED Triage Vitals  Enc Vitals Group     BP 06/25/19 0920 (!) 192/60     Pulse Rate 06/25/19 0920 71     Resp 06/25/19 0920 16     Temp 06/25/19 0920 98.2 F (36.8 C)     Temp Source 06/25/19 0920 Oral     SpO2 06/25/19 0920 97 %     Weight --      Height --      Head Circumference --      Peak Flow --      Pain Score 06/25/19 0921 0     Pain Loc --      Pain Edu? --      Excl. in [place]? --     Constitutional: Alert and oriented. Well appearing and in no acute distress.  She is oriented to self, family and location but not to year.  She is very pleasant without evidence of psychomotor agitation. Eyes: Conjunctivae are normal. Head: Atraumatic. Nose: No congestion/rhinnorhea. Mouth/Throat: Mucous membranes are moist. Neck: No stridor.  Cardiovascular: Normal rate, regular rhythm. Grossly normal heart sounds.  Good peripheral  circulation. Respiratory: Normal respiratory effort.  No retractions. Lungs CTAB. Gastrointestinal: Soft and nontender. No distention.  Denies any pain or discomfort to palpation. Musculoskeletal: No lower extremity tenderness nor edema. Neurologic:  Normal speech and language. No gross focal neurologic deficits are appreciated.  No pronator drift or tremors in any extremities.  Normal strength in all extremities.  No facial droop. Skin:  Skin is warm, dry and intact. No rash noted. Psychiatric: Mood and affect are normal. Speech and behavior are normal.  ____________________________________________   LABS (all labs ordered are listed, but only abnormal results are displayed)  Labs Reviewed  COMPREHENSIVE METABOLIC PANEL - Abnormal; Notable for the following components:      Result Value   Potassium 3.4 (*)    Glucose, Bld 152 (*)    Creatinine, Ser 1.88 (*)    Total Bilirubin 1.5 (*)    GFR calc non Af Amer 25 (*)    GFR calc Af Amer 29 (*)    All other components within normal limits  CBC - Abnormal; Notable for the following components:   RBC 5.55 (*)    MCV 76.9 (*)    MCH 24.5 (*)    RDW 20.1 (*)    All other components within normal limits  URINALYSIS, COMPLETE (UACMP) WITH MICROSCOPIC - Abnormal; Notable for the following components:   Color, Urine YELLOW (*)    APPearance HAZY (*)    Hgb urine dipstick SMALL (*)    Ketones, ur 5 (*)    Protein, ur >=300 (*)    Bacteria, UA RARE (*)    All other components within normal limits  TSH - Abnormal; Notable for the following components:   TSH 4.993 (*)    All other components within normal limits  URINE CULTURE  PROTIME-INR  LACTIC ACID, PLASMA  CBG MONITORING, ED   ____________________________________________  EKG   ____________________________________________  RADIOLOGY  DG Chest 2 View  Result Date: 06/25/2019 CLINICAL DATA:  Lung nodule EXAM: CHEST - 2 VIEW COMPARISON:  04/16/2017, chest CT  06/15/2019  FINDINGS: Left pleural effusion with left basilar atelectasis and lingular consolidation better evaluated on recent chest CT. Emphysema. Normal heart size. No acute osseous abnormality. IMPRESSION: Left pleural effusion with left basilar atelectasis. Lingular masslike consolidation better evaluated on recent CT. Electronically Signed   By: Macy Mis M.D.   On: 06/25/2019 11:53   CT Head Wo Contrast  Result Date: 06/25/2019 CLINICAL DATA:  Altered mental status EXAM: CT HEAD WITHOUT CONTRAST TECHNIQUE: Contiguous axial images were obtained from the base of the skull through the vertex without intravenous contrast. COMPARISON:  March 08, 2007 FINDINGS: Brain: No evidence of acute territorial infarction, hemorrhage, hydrocephalus,extra-axial collection or mass lesion/mass effect. There is dilatation the ventricles and sulci consistent with age-related atrophy. Low-attenuation changes in the deep white matter consistent with small vessel ischemia. Vascular: No hyperdense vessel or unexpected calcification. Skull: The skull is intact. No fracture or focal lesion identified. Sinuses/Orbits: The visualized paranasal sinuses and mastoid air cells are clear. The orbits and globes intact. Other: None IMPRESSION: No acute intracranial abnormality. Findings consistent with age related atrophy and chronic small vessel ischemia Electronically Signed   By: Prudencio Pair M.D.   On: 06/25/2019 11:43    Imaging reviewed, CT negative for acute of the head.  Chest x-ray demonstrates appears to be stable left consolidation, notes indicate primary care doctor also following this. ____________________________________________   PROCEDURES  Procedure(s) performed: None  Procedures  Critical Care performed: No  ____________________________________________   INITIAL IMPRESSION / ASSESSMENT AND PLAN / ED COURSE  Pertinent labs & imaging results that were available during my care of the patient were reviewed by me  and considered in my medical decision making (see chart for details).   Patient presents for behavioral disturbance, seems that she was walking about her house without close on today.  Family reports she does not appear to be a danger to herself or anyone else, she is not being combative or agitated though.  She does have a history of Alzheimer's, chronic renal insufficiency, ongoing evaluation for left lung mass.  Evaluation here quite reassuring, she does have some bacteria in her urine, and given her symptoms which could be of slight change in her baseline behavior we will treat with a single dose fosfomycin and culture.  Culture will be followed up by pharmacy  ----------------------------------------- 12:38 PM on 06/25/2019 -----------------------------------------  Reassuring clinical exam nontoxic without distress.  Spoken with patient and her family, comfortable with plan to discharge her to home and follow-up closely with Dr. Caryn Section.  She does have care at home to her family is closely watched.    No evidence of acute neurologic cardiac pulmonary etiology.  Stable reassuring exam, hypertensive but had not taken her home medications yet today which were administered her blood pressure medicine  She resting comfortably, will set up follow-up through the family with Dr. Caryn Section  I also sent Dr. Caryn Section a message requesting follow-up with his clinic regarding today's visit and ongoing care needs and planning.  ____________________________________________   FINAL CLINICAL IMPRESSION(S) / ED DIAGNOSES  Final diagnoses:  Dementia with behavioral disturbance, unspecified dementia type ([place])  Bacteria in urine        Note:  This document was prepared using Dragon voice recognition software and may include unintentional dictation errors       Delman Kitten, MD 06/25/19 1241

## 2019-06-25 NOTE — Telephone Encounter (Signed)
Patient's daughter calling requesting to speak with Dr. Caryn Section regarding patient's ED visit from today and potentially setting up home health for patient. Please advise.

## 2019-06-25 NOTE — Progress Notes (Signed)
[facility]  [phone] [phone]  ID: [name] OB: Aug 07, 1940  MR#: [id number]  FYB#:[id number]  Patient Care Team: Birdie Sons, MD as PCP - General (Family Medicine) [name], OD as Consulting Physician (Optometry) [name], [name], MD as Physician Assistant (Endocrinology) [name], MD as Consulting Physician (Internal Medicine) [name], MD as Consulting Physician (Pulmonary Disease)  CHIEF COMPLAINT: Nodule of the lower lobe left lung.  INTERVAL HISTORY: Patient is a 79 year old female who was noted to have a suspicious lung nodule in the lingula of her left lung.  The decision was made for active surveillance, but recent CT scan revealed nodule enlarging suspicious for underlying malignancy.  Patient admits to some unintentional weight loss and a poor appetite, but otherwise feels well.  He has no neurologic complaints.  She denies any recent fevers or illnesses.  She has no chest pain, shortness of breath, cough, or hemoptysis.  She has no nausea, vomiting, constipation, or diarrhea.  She has no urinary complaints.  Patient otherwise feels well and offers no further specific complaints today.  REVIEW OF SYSTEMS:   Review of Systems  Constitutional: Positive for weight loss. Negative for fever and malaise/fatigue.  Respiratory: Negative.  Negative for cough, hemoptysis and shortness of breath.   Cardiovascular: Negative.  Negative for chest pain and leg swelling.  Gastrointestinal: Negative.  Negative for abdominal pain.  Genitourinary: Negative.  Negative for dysuria.  Musculoskeletal: Negative.  Negative for back pain.  Skin: Negative.  Negative for rash.  Neurological: Negative.  Negative for dizziness, focal weakness, weakness and headaches.  Psychiatric/Behavioral: Negative.  The patient is not nervous/anxious.     As per HPI. Otherwise, a complete review of systems is negative.  PAST MEDICAL  HISTORY: Past Medical History:  Diagnosis Date  . Chronic kidney disease   . Diabetes mellitus without complication ([place])   . Gout   . Hyperlipidemia   . Hypertension   . Hypothyroidism   . Thyroid disease     PAST SURGICAL HISTORY: Past Surgical History:  Procedure Laterality Date  . CATARACT EXTRACTION W/PHACO Left 05/26/2018   Procedure: CATARACT EXTRACTION PHACO AND INTRAOCULAR LENS PLACEMENT ([place])  LEFT DIABETIC;  Surgeon: Leandrew Koyanagi, MD;  Location: [facility];  Service: Ophthalmology;  Laterality: Left;  Diabetic - insulin  . TUBAL LIGATION    . Vascular Stent in right leg      FAMILY HISTORY: Family History  Problem Relation Age of Onset  . Hypertension Mother   . Diabetes Mother        Diabetes mellitus Type 2  . Alzheimer's disease Mother   . Cancer Son        died at age 45, esophagus    ADVANCED DIRECTIVES (Y/N):  N  HEALTH MAINTENANCE: Social History   Tobacco Use  . Smoking status: Former Smoker    Packs/day: 0.50    Years: 30.00    Pack years: 15.00    Types: Cigarettes    Quit date: 03/18/2007    Years since quitting: 12.2  . Smokeless tobacco: Never Used  . Tobacco comment: Quit smoking in 2008  Substance Use Topics  . Alcohol use: No    Alcohol/week: 0.0 standard drinks  . Drug use: No     Colonoscopy:  PAP:  Bone density:  Lipid panel:  No Known Allergies  Current Outpatient Medications  Medication Sig Dispense Refill  . allopurinol (ZYLOPRIM) 100 MG tablet TAKE 2 TABLETS(200 MG) BY  MOUTH TWICE DAILY 360 tablet 3  . ALPRAZolam (XANAX) 0.25 MG tablet Take 1 tablet (0.25 mg total) by mouth every 6 (six) hours as needed. 30 tablet 1  . amLODipine (NORVASC) 10 MG tablet Take 10 mg by mouth daily.    Marland Kitchen aspirin EC 81 MG tablet Take 81 mg by mouth daily.     Marland Kitchen azithromycin (ZITHROMAX) 250 MG tablet Take 2 tablets PO on day one, and one tablet PO daily thereafter until completed. 6 tablet 0  . cyproheptadine (PERIACTIN) 4  MG tablet Start 1/2 tablet three times daily for one week, then increase to 1 tablet three times daily (Patient taking differently: Take 4 mg by mouth 3 (three) times daily. Start 1/2 tablet three times daily for one week, then increase to 1 tablet three times daily) 90 tablet 1  . donepezil (ARICEPT) 5 MG tablet Take 1 tablet (5 mg total) by mouth at bedtime. 30 tablet 4  . hydrochlorothiazide (HYDRODIURIL) 12.5 MG tablet TK 1 T PO QAM UTD FOR LEG SWELLING  11  . insulin NPH Human (NOVOLIN N) 100 UNIT/ML injection Inject 6-7 Units into the skin 2 (two) times daily before a meal.     . insulin regular (NOVOLIN R,HUMULIN R) 100 units/mL injection Inject 5-9 Units into the skin 3 (three) times daily before meals. 6 units in AM, 8 units at lunch and 10 units in PM    . Insulin Syringe-Needle U-100 (INSULIN SYRINGE .5CC/30GX5/16") 30G X 5/16" 0.5 ML MISC U QID UTD  5  . irbesartan (AVAPRO) 300 MG tablet Take 300 mg by mouth every evening.  8  . levothyroxine (SYNTHROID, LEVOTHROID) 50 MCG tablet Take 1 tablet by mouth daily.  5  . lovastatin (MEVACOR) 20 MG tablet TAKE 1 TABLET BY MOUTH DAILY 90 tablet 3  . megestrol (MEGACE) 20 MG tablet Take 1 tablet (20 mg total) by mouth daily. 30 tablet 5  . montelukast (SINGULAIR) 10 MG tablet TAKE 1 TABLET BY MOUTH EVERY DAY 90 tablet 4  . ondansetron (ZOFRAN) 4 MG tablet Take 1 tablet by mouth every 8 (eight) hours as needed. For nausea    . ONE TOUCH ULTRA TEST test strip TEST THREE TIMES DAILY 100 each 5  . ONETOUCH DELICA LANCETS 86P MISC INJECT AS DIRECTED THREE TIMES DAILY    . potassium chloride SA (K-DUR,KLOR-CON) 20 MEQ tablet Take 1 tablet by mouth daily.  2  . torsemide (DEMADEX) 20 MG tablet Take 20 mg by mouth daily.      No current facility-administered medications for this visit.    OBJECTIVE: Vitals:   07/01/19 1348  BP: (!) 169/65  Pulse: 80  Resp: 16  Temp: (!) 96.3 F (35.7 C)  SpO2: 100%     Body mass index is 13.77 kg/m.    ECOG  FS:1 - Symptomatic but completely ambulatory  General: Thin, no acute distress.  Sitting in a wheelchair. Eyes: Pink conjunctiva, anicteric sclera. HEENT: Normocephalic, moist mucous membranes. Lungs: No audible wheezing or coughing. Heart: Regular rate and rhythm. Abdomen: Soft, nontender, no obvious distention. Musculoskeletal: No edema, cyanosis, or clubbing. Neuro: Alert, answering all questions appropriately. Cranial nerves grossly intact. Skin: No rashes or petechiae noted. Psych: Normal affect. Lymphatics: No cervical, calvicular, axillary or inguinal LAD.   LAB RESULTS:  Lab Results  Component Value Date   NA 142 06/25/2019   K 3.4 (L) 06/25/2019   CL 105 06/25/2019   CO2 24 06/25/2019   GLUCOSE 152 (H) 06/25/2019  BUN 17 06/25/2019   CREATININE 1.88 (H) 06/25/2019   CALCIUM 9.7 06/25/2019   PROT 7.8 06/25/2019   ALBUMIN 4.0 06/25/2019   AST 15 06/25/2019   ALT 10 06/25/2019   ALKPHOS 109 06/25/2019   BILITOT 1.5 (H) 06/25/2019   GFRNONAA 25 (L) 06/25/2019   GFRAA 29 (L) 06/25/2019    Lab Results  Component Value Date   WBC 10.5 06/25/2019   NEUTROABS 29.7 (H) 03/17/2017   HGB 13.6 06/25/2019   HCT 42.7 06/25/2019   MCV 76.9 (L) 06/25/2019   PLT 283 06/25/2019     STUDIES: CT Abdomen Pelvis Wo Contrast  Result Date: 06/15/2019 CLINICAL DATA:  Follow-up lung nodule, abdominal pain EXAM: CT CHEST, ABDOMEN AND PELVIS WITHOUT CONTRAST TECHNIQUE: Multidetector CT imaging of the chest, abdomen and pelvis was performed following the standard protocol without IV contrast. COMPARISON:  Chest CT 2019, abdominal CT 2014 FINDINGS: CT CHEST FINDINGS Cardiovascular: Normal heart size. New moderate pericardial effusion. Coronary and aortic calcification. Mediastinum/Nodes: Possible enlarged node anterior to the carina measuring 2.5 cm. There is also increased subcarinal soft tissue density. Limited evaluation for hilar adenopathy due to lack of intravenous contrast. There  are calcified left hilar lymph nodes. No axillary adenopathy. Lungs/Pleura: New small to moderate left pleural effusion and adjacent atelectasis. Masslike consolidation within the lingula including the area of previously seen spiculated mass. Emphysema with bullous changes. Soft tissue density associated with cystic cavity of the right lower lobe again identified and is probably not neoplastic. Musculoskeletal: No aggressive osseous lesion. CT ABDOMEN PELVIS FINDINGS Hepatobiliary: Small hypoattenuating liver lesions were present on 2014 study and therefore benign. Gallbladder is unremarkable. No biliary dilatation. Pancreas: Atrophic and calcified likely reflecting sequelae of chronic pancreatitis. Spleen: Unremarkable. Adrenals/Urinary Tract: Bilateral nonobstructing renal calculi. Adrenals are unremarkable. Stomach/Bowel: Stomach is within normal limits. Bowel is normal in caliber. Appendix is not visualized. Vascular/Lymphatic: Marked aortic atherosclerosis. No adenopathy identified within limitation of noncontrast study. Reproductive: Grossly unremarkable. Other: Nonspecific soft tissue thickening in the subcutaneous fat of the low ventral abdominal wall. Musculoskeletal: No aggressive osseous lesion. IMPRESSION: New small to moderate left pleural effusion with adjacent atelectasis. Increase in size of masslike consolidation within the lingula including the area of previously seen mass and likely reflecting progression of disease. New moderate pericardial effusion. Possible enlarged precarinal and subcarinal lymph nodes. Evaluation of the mediastinum and hila suboptimal on this noncontrast study. Chronic intra-abdominal findings detailed above. Electronically Signed   By: Macy Mis M.D.   On: 06/15/2019 16:53   DG Chest 2 View  Result Date: 06/25/2019 CLINICAL DATA:  Lung nodule EXAM: CHEST - 2 VIEW COMPARISON:  04/16/2017, chest CT 06/15/2019 FINDINGS: Left pleural effusion with left basilar  atelectasis and lingular consolidation better evaluated on recent chest CT. Emphysema. Normal heart size. No acute osseous abnormality. IMPRESSION: Left pleural effusion with left basilar atelectasis. Lingular masslike consolidation better evaluated on recent CT. Electronically Signed   By: Macy Mis M.D.   On: 06/25/2019 11:53   CT Head Wo Contrast  Result Date: 06/25/2019 CLINICAL DATA:  Altered mental status EXAM: CT HEAD WITHOUT CONTRAST TECHNIQUE: Contiguous axial images were obtained from the base of the skull through the vertex without intravenous contrast. COMPARISON:  March 08, 2007 FINDINGS: Brain: No evidence of acute territorial infarction, hemorrhage, hydrocephalus,extra-axial collection or mass lesion/mass effect. There is dilatation the ventricles and sulci consistent with age-related atrophy. Low-attenuation changes in the deep white matter consistent with small vessel ischemia. Vascular: No hyperdense vessel  or unexpected calcification. Skull: The skull is intact. No fracture or focal lesion identified. Sinuses/Orbits: The visualized paranasal sinuses and mastoid air cells are clear. The orbits and globes intact. Other: None IMPRESSION: No acute intracranial abnormality. Findings consistent with age related atrophy and chronic small vessel ischemia Electronically Signed   By: Prudencio Pair M.D.   On: 06/25/2019 11:43   CT CHEST WO CONTRAST  Result Date: 06/15/2019 CLINICAL DATA:  Follow-up lung nodule, abdominal pain EXAM: CT CHEST, ABDOMEN AND PELVIS WITHOUT CONTRAST TECHNIQUE: Multidetector CT imaging of the chest, abdomen and pelvis was performed following the standard protocol without IV contrast. COMPARISON:  Chest CT 2019, abdominal CT 2014 FINDINGS: CT CHEST FINDINGS Cardiovascular: Normal heart size. New moderate pericardial effusion. Coronary and aortic calcification. Mediastinum/Nodes: Possible enlarged node anterior to the carina measuring 2.5 cm. There is also increased  subcarinal soft tissue density. Limited evaluation for hilar adenopathy due to lack of intravenous contrast. There are calcified left hilar lymph nodes. No axillary adenopathy. Lungs/Pleura: New small to moderate left pleural effusion and adjacent atelectasis. Masslike consolidation within the lingula including the area of previously seen spiculated mass. Emphysema with bullous changes. Soft tissue density associated with cystic cavity of the right lower lobe again identified and is probably not neoplastic. Musculoskeletal: No aggressive osseous lesion. CT ABDOMEN PELVIS FINDINGS Hepatobiliary: Small hypoattenuating liver lesions were present on 2014 study and therefore benign. Gallbladder is unremarkable. No biliary dilatation. Pancreas: Atrophic and calcified likely reflecting sequelae of chronic pancreatitis. Spleen: Unremarkable. Adrenals/Urinary Tract: Bilateral nonobstructing renal calculi. Adrenals are unremarkable. Stomach/Bowel: Stomach is within normal limits. Bowel is normal in caliber. Appendix is not visualized. Vascular/Lymphatic: Marked aortic atherosclerosis. No adenopathy identified within limitation of noncontrast study. Reproductive: Grossly unremarkable. Other: Nonspecific soft tissue thickening in the subcutaneous fat of the low ventral abdominal wall. Musculoskeletal: No aggressive osseous lesion. IMPRESSION: New small to moderate left pleural effusion with adjacent atelectasis. Increase in size of masslike consolidation within the lingula including the area of previously seen mass and likely reflecting progression of disease. New moderate pericardial effusion. Possible enlarged precarinal and subcarinal lymph nodes. Evaluation of the mediastinum and hila suboptimal on this noncontrast study. Chronic intra-abdominal findings detailed above. Electronically Signed   By: Macy Mis M.D.   On: 06/15/2019 16:53    ASSESSMENT:  Nodule of the lower lobe left lung.  PLAN:    1. Nodule of the  lower lobe left lung: CT scan results from June 15, 2019 reviewed independently and report as above with persistent suspicion of malignancy in lingula of left lower lobe.  Patient also has possible enlarged precarinal and subcarinal lymph nodes.  She has a PET scan scheduled for Wednesday, July 07, 2019 for further evaluation.  If PET scan is negative, no further intervention will be needed.  If PET positive, patient will likely require biopsy to obtain a diagnosis.  Return to clinic 1 week from today to discuss her results and additional diagnostic planning if needed. 2.  Weight loss: Encouraged adding dietary supplements such as boost or Ensure to maintain.  Will readdress at clinic visit next week.  Consider dietary consult if needed.  I spent a total of 60 minutes reviewing chart data, face-to-face evaluation with the patient, counseling and coordination of care as detailed above.   Patient expressed understanding and was in agreement with this plan. She also understands that She can call clinic at any time with any questions, concerns, or complaints.   Cancer Staging No matching staging  information was found for the patient.  Lloyd Huger, MD   07/01/2019 4:00 PM

## 2019-06-25 NOTE — Telephone Encounter (Signed)
Patient will need an office visit for [facility] orders as there has to be an encounter to order from to get insurance coverage

## 2019-06-25 NOTE — ED Notes (Signed)
Pt transported otf for imaging

## 2019-06-25 NOTE — ED Notes (Signed)
Pt ambulated to restroom with stand by assist, gait slow but steady

## 2019-06-25 NOTE — Telephone Encounter (Addendum)
Called and spoke with daughter and advised her that a office visit would be needed for home health request  for evaluation and assessment. I asked daughter if patient has already scheduled hospital visit in the next week with Dr. Caryn Section? Daughter states that patient is still currently in ED, advised her to give [place] a call back if she does not get set up for follow up for ED visit , she is aware that patient would need to be seen for home health services requested. Daughter understood. KW

## 2019-06-25 NOTE — ED Notes (Signed)
Pt speaking with this RN in NAD, A&Ox3 but does not know what month this is. Pt's daughter at bedside reporting pt has a nodule on her chest that they want "checked for cancer". Daughter reports a family member has been watching her on the camera at home walking around without clothes on at times.

## 2019-06-26 LAB — URINE CULTURE
Culture: NO GROWTH
Special Requests: NORMAL

## 2019-06-28 ENCOUNTER — Ambulatory Visit: Payer: Medicare Other | Admitting: Physician Assistant

## 2019-06-28 ENCOUNTER — Ambulatory Visit (INDEPENDENT_AMBULATORY_CARE_PROVIDER_SITE_OTHER): Payer: Medicare Other | Admitting: Physician Assistant

## 2019-06-28 ENCOUNTER — Other Ambulatory Visit: Payer: Self-pay

## 2019-06-28 ENCOUNTER — Encounter: Payer: Self-pay | Admitting: Physician Assistant

## 2019-06-28 VITALS — BP 178/74 | HR 84 | Temp 98.9°F | Resp 16 | Wt 85.6 lb

## 2019-06-28 DIAGNOSIS — E1029 Type 1 diabetes mellitus with other diabetic kidney complication: Secondary | ICD-10-CM | POA: Diagnosis not present

## 2019-06-28 DIAGNOSIS — I129 Hypertensive chronic kidney disease with stage 1 through stage 4 chronic kidney disease, or unspecified chronic kidney disease: Secondary | ICD-10-CM

## 2019-06-28 DIAGNOSIS — N184 Chronic kidney disease, stage 4 (severe): Secondary | ICD-10-CM

## 2019-06-28 DIAGNOSIS — J189 Pneumonia, unspecified organism: Secondary | ICD-10-CM

## 2019-06-28 DIAGNOSIS — F0390 Unspecified dementia without behavioral disturbance: Secondary | ICD-10-CM

## 2019-06-28 DIAGNOSIS — R911 Solitary pulmonary nodule: Secondary | ICD-10-CM | POA: Diagnosis not present

## 2019-06-28 MED ORDER — AZITHROMYCIN 250 MG PO TABS: ORAL_TABLET | ORAL | 0 refills | Status: DC

## 2019-06-28 NOTE — Patient Instructions (Signed)
Dementia Dementia is a condition that affects the way the brain functions. It often affects memory and thinking. Usually, dementia gets worse with time and cannot be reversed (progressive dementia). There are many types of dementia, including:  Alzheimer's disease. This type is the most common.  Vascular dementia. This type may happen as the result of a stroke.  Lewy body dementia. This type may happen to people who have Parkinson's disease.  Frontotemporal dementia. This type is caused by damage to nerve cells (neurons) in certain parts of the brain. Some people may be affected by more than one type of dementia. This is called mixed dementia. What are the causes? Dementia is caused by damage to cells in the brain. The area of the brain and the types of cells damaged determine the type of dementia. Usually, this damage is irreversible or cannot be undone. Some examples of irreversible causes include:  Conditions that affect the blood vessels of the brain, such as diabetes, heart disease, or blood vessel disease.  Genetic mutations. In some cases, changes in the brain may be caused by another condition and can be reversed or slowed. Some examples of reversible causes include:  Injury to the brain.  Certain medicines.  Infection, such as meningitis.  Metabolic problems, such as vitamin B12 deficiency or thyroid disease.  Pressure on the brain, such as from a tumor or blood clot. What are the signs or symptoms? Symptoms of dementia depend on the type of dementia. Common signs of dementia include problems with remembering, thinking, problem solving, decision making, and communicating. These signs develop slowly or get worse with time. This may include:  Problems remembering things.  Having trouble taking a bath or putting clothes on.  Forgetting appointments.  Forgetting to pay bills.  Difficulty planning and preparing meals.  Having trouble speaking.  Getting lost easily. How  is this diagnosed? This condition is diagnosed by a specialist (neurologist). It is diagnosed based on the history of your symptoms, your medical history, a physical exam, and tests. Tests may include:  Tests to evaluate brain function, such as memory tests, cognitive tests, and other tests.  Lab tests, such as blood or urine tests.  Imaging tests, such as a CT scan, a PET scan, or an MRI.  Genetic testing. This may be done if other family members have a diagnosis of certain types of dementia. Your health care provider will talk with you and your family, friends, or caregivers about your history and symptoms. How is this treated?  Treatment for this condition depends on the cause of the dementia. Progressive dementias, such as Alzheimer's disease, cannot be cured, but there may be treatments that help to manage symptoms. Treatment might involve taking medicines that may help to:  Control the dementia.  Slow down the progression of the dementia.  Manage symptoms. In some cases, treating the cause of your dementia can improve symptoms, reverse symptoms, or slow down how quickly your dementia becomes worse. Your health care provider can direct you to support groups, organizations, and other health care providers who can help with decisions about your care. Follow these instructions at home: Medicines  Take over-the-counter and prescription medicines only as told by your health care provider.  Use a pill organizer or pill reminder to help you manage your medicines.  Avoid taking medicines that can affect thinking, such as pain medicines or sleeping medicines. Lifestyle  Make healthy lifestyle choices. ? Be physically active as told by your health care provider. ? Do  not use any products that contain nicotine or tobacco, such as cigarettes, e-cigarettes, and chewing tobacco. If you need help quitting, ask your health care provider. ? Do not drink alcohol. ? Practice stress-management  techniques when you get stressed. ? Spend time with other people.  Make sure to get quality sleep. These tips can help you get a good night's rest: ? Avoid napping during the day. ? Keep your sleeping area dark and cool. ? Avoid exercising during the few hours before you go to bed. ? Avoid caffeine products in the evening. Eating and drinking  Drink enough fluid to keep your urine pale yellow.  Eat a healthy diet. General instructions   Work with your health care provider to determine what you need help with and what your safety needs are.  Talk with your health care provider about whether it is safe for you to drive.  If you were given a bracelet that identifies you as a person with memory loss or tracks your location, make sure to wear it at all times.  Work with your family to make important decisions, such as advance directives, medical power of attorney, or a living will.  Keep all follow-up visits as told by your health care provider. This is important. Where to find more information  Alzheimer's Association: CapitalMile.co.nz  National Institute on Aging: DVDEnthusiasts.nl  World Health Organization: RoleLink.com.br Contact a health care provider if:  You have any new or worsening symptoms.  You have problems with choking or swallowing. Get help right away if:  You feel depressed or sad, or feel that you want to harm yourself.  Your family members become concerned for your safety. If you ever feel like you may hurt yourself or others, or have thoughts about taking your own life, get help right away. You can go to your nearest emergency department or call:  Your local emergency services (911 in the [place]  A suicide crisis helpline, such as the [place] at 502-733-0733. This is open 24 hours a day. Summary  Dementia is a condition that affects the way the brain functions. Dementia often affects memory and thinking.  Usually,  dementia gets worse with time and cannot be reversed (progressive dementia).  Treatment for this condition depends on the cause of the dementia.  Work with your health care provider to determine what you need help with and what your safety needs are.  Your health care provider can direct you to support groups, organizations, and other health care providers who can help with decisions about your care. This information is not intended to replace advice given to you by your health care provider. Make sure you discuss any questions you have with your health care provider. Document Revised: 05/19/2018 Document Reviewed: 05/19/2018 Elsevier Patient Education  [place].

## 2019-06-28 NOTE — Progress Notes (Signed)
Established patient visit      Patient: Caitlyn Marshall   DOB: 07-15-1940   79 y.o. Female  MRN: [id number] Visit Date: 07/02/2019  Today's healthcare provider: Mar Daring, PA-C  Subjective:    Chief Complaint  Patient presents with  . Follow-up    ER   HPI  Caryn Bee, patient's grandson here with her. Caregiver not able to come in with her but available through the phone to talk to provider.   Follow up Emergency Department  Patient was seen at [facility]  on 06/25/2019. She was treated for Dementia with behavioral Disturbances Treatment for this included Imaging reviewed, CT negative for acute of the head.  Chest x-ray demonstrates appears to be stable left consolidation She reports good compliance with treatment. She reports this condition is stayed the same. Grandson reports that she is still being found sitting around without clothes on and acting more confused. She feels she is doing better. They are requesting HH due to change in patient's status.   ------------------------------------------------------------------------------------------------------------ -      Medications: Outpatient Medications Prior to Visit  Medication Sig  . allopurinol (ZYLOPRIM) 100 MG tablet TAKE 2 TABLETS(200 MG) BY MOUTH TWICE DAILY  . ALPRAZolam (XANAX) 0.25 MG tablet Take 1 tablet (0.25 mg total) by mouth every 6 (six) hours as needed.  Marland Kitchen amLODipine (NORVASC) 10 MG tablet Take 10 mg by mouth daily.  Marland Kitchen aspirin EC 81 MG tablet Take 81 mg by mouth daily.   . cyproheptadine (PERIACTIN) 4 MG tablet Start 1/2 tablet three times daily for one week, then increase to 1 tablet three times daily (Patient taking differently: Take 4 mg by mouth 3 (three) times daily. Start 1/2 tablet three times daily for one week, then increase to 1 tablet three times daily)  . donepezil (ARICEPT) 5 MG tablet Take 1 tablet (5 mg total) by mouth at bedtime.  . hydrochlorothiazide (HYDRODIURIL) 12.5 MG tablet  TK 1 T PO QAM UTD FOR LEG SWELLING  . insulin NPH Human (NOVOLIN N) 100 UNIT/ML injection Inject 6-7 Units into the skin 2 (two) times daily before a meal.   . insulin regular (NOVOLIN R,HUMULIN R) 100 units/mL injection Inject 5-9 Units into the skin 3 (three) times daily before meals. 6 units in AM, 8 units at lunch and 10 units in PM  . Insulin Syringe-Needle U-100 (INSULIN SYRINGE .5CC/30GX5/16") 30G X 5/16" 0.5 ML MISC U QID UTD  . irbesartan (AVAPRO) 300 MG tablet Take 300 mg by mouth every evening.  Marland Kitchen levothyroxine (SYNTHROID, LEVOTHROID) 50 MCG tablet Take 1 tablet by mouth daily.  Marland Kitchen lovastatin (MEVACOR) 20 MG tablet TAKE 1 TABLET BY MOUTH DAILY  . megestrol (MEGACE) 20 MG tablet Take 1 tablet (20 mg total) by mouth daily.  . montelukast (SINGULAIR) 10 MG tablet TAKE 1 TABLET BY MOUTH EVERY DAY  . ondansetron (ZOFRAN) 4 MG tablet Take 1 tablet by mouth every 8 (eight) hours as needed. For nausea  . ONE TOUCH ULTRA TEST test strip TEST THREE TIMES DAILY  . ONETOUCH DELICA LANCETS 27P MISC INJECT AS DIRECTED THREE TIMES DAILY  . potassium chloride SA (K-DUR,KLOR-CON) 20 MEQ tablet Take 1 tablet by mouth daily.  Marland Kitchen torsemide (DEMADEX) 20 MG tablet Take 20 mg by mouth daily.    No facility-administered medications prior to visit.    Review of Systems  Constitutional: Negative.   HENT: Negative.   Respiratory: Negative.   Cardiovascular: Negative.   Neurological: Negative for dizziness,  weakness, light-headedness, numbness and headaches.  Psychiatric/Behavioral: Positive for behavioral problems.       Dementia    Last CBC Lab Results  Component Value Date   WBC 10.5 06/25/2019   HGB 13.6 06/25/2019   HCT 42.7 06/25/2019   MCV 76.9 (L) 06/25/2019   MCH 24.5 (L) 06/25/2019   RDW 20.1 (H) 06/25/2019   PLT 283 02/40/9735   Last metabolic panel Lab Results  Component Value Date   GLUCOSE 152 (H) 06/25/2019   NA 142 06/25/2019   K 3.4 (L) 06/25/2019   CL 105 06/25/2019   CO2  24 06/25/2019   BUN 17 06/25/2019   CREATININE 1.88 (H) 06/25/2019   GFRNONAA 25 (L) 06/25/2019   GFRAA 29 (L) 06/25/2019   CALCIUM 9.7 06/25/2019   PHOS 3.9 09/07/2018   PROT 7.8 06/25/2019   ALBUMIN 4.0 06/25/2019   LABGLOB 2.5 05/31/2019   AGRATIO 1.6 05/31/2019   BILITOT 1.5 (H) 06/25/2019   ALKPHOS 109 06/25/2019   AST 15 06/25/2019   ALT 10 06/25/2019   ANIONGAP 13 06/25/2019   Last lipids Lab Results  Component Value Date   CHOL 131 10/03/2016   HDL 65 10/03/2016   LDLCALC 49 10/03/2016   TRIG 87 10/03/2016   CHOLHDL 2.0 10/03/2016   Last hemoglobin A1c Lab Results  Component Value Date   HGBA1C 7.2 (H) 05/31/2019   Last thyroid functions Lab Results  Component Value Date   TSH 4.993 (H) 06/25/2019   Last vitamin D Lab Results  Component Value Date   VD25OH 8.2 (L) 11/12/2017   Last vitamin B12 and Folate No results found for: VITAMINB12, FOLATE      Objective:    BP (!) 178/74 (BP Location: Left Arm, Patient Position: Sitting, Cuff Size: Small)   Pulse 84   Temp 98.9 F (37.2 C) (Oral)   Resp 16   Wt 85 lb 9.6 oz (38.8 kg)   SpO2 98%   BMI 13.82 kg/m  BP Readings from Last 3 Encounters:  07/01/19 (!) 169/65  06/28/19 (!) 178/74  06/25/19 (!) 183/60   Wt Readings from Last 3 Encounters:  07/01/19 85 lb 4.8 oz (38.7 kg)  06/28/19 85 lb 9.6 oz (38.8 kg)  05/31/19 100 lb 12.8 oz (45.7 kg)      Physical Exam Vitals reviewed.  Constitutional:      General: She is not in acute distress.    Appearance: Normal appearance. She is well-developed and underweight. She is not diaphoretic.  Cardiovascular:     Rate and Rhythm: Normal rate and regular rhythm.     Heart sounds: Normal heart sounds. No murmur. No friction rub. No gallop.   Pulmonary:     Effort: Pulmonary effort is normal. No respiratory distress.     Breath sounds: Normal breath sounds. No wheezing or rales.  Musculoskeletal:     Cervical back: Normal range of motion and neck  supple.     Right lower leg: No edema.     Left lower leg: No edema.  Neurological:     Mental Status: She is alert.  Psychiatric:        Behavior: Behavior is cooperative.       No results found for any visits on 06/28/19.    Assessment & Plan:    1. Pneumonia of left lower lobe due to infectious organism Had noted to have an effusion in the left lower lung. Still with some SOB and cough. Will treat with Zpak as  below to see if symptoms change and hopefully improve.  - azithromycin (ZITHROMAX) 250 MG tablet; Take 2 tablets PO on day one, and one tablet PO daily thereafter until completed.  Dispense: 6 tablet; Refill: 0  2. Dementia without behavioral disturbance, unspecified dementia type ([place]) Deteriorating more. Now having behavioral disturbances. Referral for [facility] evaluation placed due to change in patient status/progression of disease.  - Ambulatory referral to [place]  3. 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 Followed by Endocrinology, Dr. Gabriel Carina.  - Ambulatory referral to [place]  4. 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  - Ambulatory referral to Home Health  5. 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 Dr. Caryn Section already had placed referrals for oncology and further imaging.  - Ambulatory referral to [place], [place] [phone] (phone) [phone] (fax)  [place]

## 2019-06-28 NOTE — Progress Notes (Deleted)
Established patient visit   {  This note template is in development as part of the [place].   This template contains optional SmartLists. If no selections are made from an optional SmartList, it will be automatically removed when the note is signed.  Left clicking SmartLists that are in blue, underlined text will show additional information regarding the patient's history unless you are already editing the note in a pop-up window.  This text will be automatically removed from this note when it is signed.:1}   Patient: Caitlyn Marshall   DOB: 08-Sep-1940   79 y.o. Female  MRN: [id number] Visit Date: 06/28/2019  Today's healthcare provider: Mar Daring, PA-C  Subjective:   No chief complaint on file.  HPI Patient here for home health services.  Patient was seed at the [facility] ED on 06/25/2019  {Show patient history (optional):23778::" "}   Medications: Outpatient Medications Prior to Visit  Medication Sig  . allopurinol (ZYLOPRIM) 100 MG tablet TAKE 2 TABLETS(200 MG) BY MOUTH TWICE DAILY  . ALPRAZolam (XANAX) 0.25 MG tablet Take 1 tablet (0.25 mg total) by mouth every 6 (six) hours as needed.  Marland Kitchen amLODipine (NORVASC) 10 MG tablet Take 10 mg by mouth daily.  Marland Kitchen aspirin EC 81 MG tablet Take 81 mg by mouth daily.   . cyproheptadine (PERIACTIN) 4 MG tablet Start 1/2 tablet three times daily for one week, then increase to 1 tablet three times daily (Patient taking differently: Take 4 mg by mouth 3 (three) times daily. Start 1/2 tablet three times daily for one week, then increase to 1 tablet three times daily)  . donepezil (ARICEPT) 5 MG tablet Take 1 tablet (5 mg total) by mouth at bedtime.  . hydrochlorothiazide (HYDRODIURIL) 12.5 MG tablet TK 1 T PO QAM UTD FOR LEG SWELLING  . insulin NPH Human (NOVOLIN N) 100 UNIT/ML injection Inject 6-7 Units into the skin 2 (two) times daily before a meal.   . insulin regular (NOVOLIN R,HUMULIN R) 100 units/mL  injection Inject 5-9 Units into the skin 3 (three) times daily before meals. 6 units in AM, 8 units at lunch and 10 units in PM  . Insulin Syringe-Needle U-100 (INSULIN SYRINGE .5CC/30GX5/16") 30G X 5/16" 0.5 ML MISC U QID UTD  . irbesartan (AVAPRO) 300 MG tablet Take 300 mg by mouth every evening.  Marland Kitchen levothyroxine (SYNTHROID, LEVOTHROID) 50 MCG tablet Take 1 tablet by mouth daily.  Marland Kitchen lovastatin (MEVACOR) 20 MG tablet TAKE 1 TABLET BY MOUTH DAILY  . megestrol (MEGACE) 20 MG tablet Take 1 tablet (20 mg total) by mouth daily.  . montelukast (SINGULAIR) 10 MG tablet TAKE 1 TABLET BY MOUTH EVERY DAY  . ondansetron (ZOFRAN) 4 MG tablet Take 1 tablet by mouth every 8 (eight) hours as needed. For nausea  . ONE TOUCH ULTRA TEST test strip TEST THREE TIMES DAILY  . ONETOUCH DELICA LANCETS 64Q MISC INJECT AS DIRECTED THREE TIMES DAILY  . potassium chloride SA (K-DUR,KLOR-CON) 20 MEQ tablet Take 1 tablet by mouth daily.  Marland Kitchen torsemide (DEMADEX) 20 MG tablet Take 20 mg by mouth daily.    No facility-administered medications prior to visit.    Review of Systems  {Show previous labs (optional):23779::" "}    Objective:    There were no vitals taken for this visit. {Show previous vital signs (optional):23777::" "}  Physical Exam  ***  No results found for any visits on 06/28/19.    Assessment & Plan:    ***  [name]  [facility] [phone] (phone) [phone] (fax)  [place]

## 2019-06-29 ENCOUNTER — Telehealth: Payer: Self-pay

## 2019-06-29 NOTE — Telephone Encounter (Signed)
Patient's daughter [name] advised referral has been placed and they will receive a call from [facility] agency or Judson Roch.

## 2019-06-29 NOTE — Telephone Encounter (Signed)
Copied from [place] [phone]. Topic: General - Inquiry >> Jun 29, 2019  9:46 AM Alanda Slim E wrote: Reason for CRM: Lind Covert called to see when Home health services would be coming out to the Pts home/ please advise

## 2019-06-30 ENCOUNTER — Telehealth: Payer: Self-pay

## 2019-06-30 NOTE — Telephone Encounter (Signed)
Copied from [place] [id number]. Topic: General - Other >> Jun 30, 2019  3:44 PM Parke Poisson wrote: Corene Cornea from [facility] states they will not be able to start services until 07/02/19. He wanted to make sure you were ok with this

## 2019-07-01 ENCOUNTER — Encounter: Payer: Self-pay | Admitting: *Deleted

## 2019-07-01 ENCOUNTER — Inpatient Hospital Stay: Payer: Medicare Other | Attending: Oncology | Admitting: Oncology

## 2019-07-01 ENCOUNTER — Encounter: Payer: Self-pay | Admitting: Oncology

## 2019-07-01 ENCOUNTER — Other Ambulatory Visit: Payer: Self-pay

## 2019-07-01 ENCOUNTER — Inpatient Hospital Stay: Payer: Medicare Other

## 2019-07-01 VITALS — BP 169/65 | HR 80 | Temp 96.3°F | Resp 16 | Wt 85.3 lb

## 2019-07-01 DIAGNOSIS — Z79899 Other long term (current) drug therapy: Secondary | ICD-10-CM | POA: Diagnosis not present

## 2019-07-01 DIAGNOSIS — R918 Other nonspecific abnormal finding of lung field: Secondary | ICD-10-CM | POA: Diagnosis not present

## 2019-07-01 DIAGNOSIS — Z808 Family history of malignant neoplasm of other organs or systems: Secondary | ICD-10-CM

## 2019-07-01 DIAGNOSIS — E039 Hypothyroidism, unspecified: Secondary | ICD-10-CM | POA: Diagnosis not present

## 2019-07-01 DIAGNOSIS — Z818 Family history of other mental and behavioral disorders: Secondary | ICD-10-CM | POA: Insufficient documentation

## 2019-07-01 DIAGNOSIS — R634 Abnormal weight loss: Secondary | ICD-10-CM | POA: Diagnosis not present

## 2019-07-01 DIAGNOSIS — J984 Other disorders of lung: Secondary | ICD-10-CM | POA: Insufficient documentation

## 2019-07-01 DIAGNOSIS — I313 Pericardial effusion (noninflammatory): Secondary | ICD-10-CM | POA: Diagnosis not present

## 2019-07-01 DIAGNOSIS — I129 Hypertensive chronic kidney disease with stage 1 through stage 4 chronic kidney disease, or unspecified chronic kidney disease: Secondary | ICD-10-CM | POA: Insufficient documentation

## 2019-07-01 DIAGNOSIS — J439 Emphysema, unspecified: Secondary | ICD-10-CM | POA: Diagnosis not present

## 2019-07-01 DIAGNOSIS — R911 Solitary pulmonary nodule: Secondary | ICD-10-CM | POA: Insufficient documentation

## 2019-07-01 DIAGNOSIS — Z833 Family history of diabetes mellitus: Secondary | ICD-10-CM | POA: Insufficient documentation

## 2019-07-01 DIAGNOSIS — N200 Calculus of kidney: Secondary | ICD-10-CM | POA: Insufficient documentation

## 2019-07-01 DIAGNOSIS — N184 Chronic kidney disease, stage 4 (severe): Secondary | ICD-10-CM

## 2019-07-01 DIAGNOSIS — Z8249 Family history of ischemic heart disease and other diseases of the circulatory system: Secondary | ICD-10-CM | POA: Diagnosis not present

## 2019-07-01 DIAGNOSIS — R630 Anorexia: Secondary | ICD-10-CM | POA: Insufficient documentation

## 2019-07-01 DIAGNOSIS — Z87891 Personal history of nicotine dependence: Secondary | ICD-10-CM | POA: Diagnosis not present

## 2019-07-01 DIAGNOSIS — E1122 Type 2 diabetes mellitus with diabetic chronic kidney disease: Secondary | ICD-10-CM | POA: Diagnosis not present

## 2019-07-01 DIAGNOSIS — J90 Pleural effusion, not elsewhere classified: Secondary | ICD-10-CM | POA: Insufficient documentation

## 2019-07-01 NOTE — Progress Notes (Signed)
Patient's daughter states patient has no appetite  and has not been eating and not staying hydrated. Patient states she is also having some back pain and rate pain at a 7. Patient has also had some nausea that comes and goes. Currently no medication to help with that.

## 2019-07-02 NOTE — Progress Notes (Signed)
  Oncology Nurse Navigator Documentation  Navigator Location: [facility]-Med Onc (07/02/19 1100) Referral Date to RadOnc/MedOnc: 06/20/19 (07/02/19 1100) )Navigator Encounter Type: Initial MedOnc (07/02/19 1100)   Abnormal Finding Date: 06/15/19 (07/02/19 1100)                   Treatment Phase: Abnormal Scans (07/02/19 1100) Barriers/Navigation Needs: Coordination of Care;Morbidities/Frailty (07/02/19 1100)   Interventions: Coordination of Care (07/02/19 1100)   Coordination of Care: Appts;Radiology (07/02/19 1100)        Acuity: Level 2-Minimal Needs (1-2 Barriers Identified) (07/02/19 1100)    met with patient and her daughter during initial consult with Dr. [name] to review most recent imaging and discuss next steps. All questions answered during visit. Reviewed upcoming appts. Contact info given and instructed to call with any further questions or needs. Pt and her daughter verbalized understanding.      Time Spent with Patient: 60 (07/02/19 1100)

## 2019-07-02 NOTE — Progress Notes (Deleted)
[place]  [phone] [phone]  ID: Caitlyn Marshall OB: 12-09-1940  MR#: [id number]  ZWC#:[id number]  Patient Care Team: Birdie Sons, MD as PCP - General (Family Medicine) [name], OD as Consulting Physician (Optometry) [name], [name], MD as Physician Assistant (Endocrinology) [name], MD as Consulting Physician (Internal Medicine) [name], MD as Consulting Physician (Pulmonary Disease)  CHIEF COMPLAINT: Nodule of the lower lobe left lung.  INTERVAL HISTORY: Patient is a 79 year old female who was noted to have a suspicious lung nodule in the lingula of her left lung.  The decision was made for active surveillance, but recent CT scan revealed nodule enlarging suspicious for underlying malignancy.  Patient admits to some unintentional weight loss and a poor appetite, but otherwise feels well.  He has no neurologic complaints.  She denies any recent fevers or illnesses.  She has no chest pain, shortness of breath, cough, or hemoptysis.  She has no nausea, vomiting, constipation, or diarrhea.  She has no urinary complaints.  Patient otherwise feels well and offers no further specific complaints today.  REVIEW OF SYSTEMS:   Review of Systems  Constitutional: Positive for weight loss. Negative for fever and malaise/fatigue.  Respiratory: Negative.  Negative for cough, hemoptysis and shortness of breath.   Cardiovascular: Negative.  Negative for chest pain and leg swelling.  Gastrointestinal: Negative.  Negative for abdominal pain.  Genitourinary: Negative.  Negative for dysuria.  Musculoskeletal: Negative.  Negative for back pain.  Skin: Negative.  Negative for rash.  Neurological: Negative.  Negative for dizziness, focal weakness, weakness and headaches.  Psychiatric/Behavioral: Negative.  The patient is not nervous/anxious.     As per HPI. Otherwise, a complete review of systems is negative.  PAST MEDICAL  HISTORY: Past Medical History:  Diagnosis Date  . Chronic kidney disease   . Diabetes mellitus without complication ([place])   . Gout   . Hyperlipidemia   . Hypertension   . Hypothyroidism   . Thyroid disease     PAST SURGICAL HISTORY: Past Surgical History:  Procedure Laterality Date  . CATARACT EXTRACTION W/PHACO Left 05/26/2018   Procedure: CATARACT EXTRACTION PHACO AND INTRAOCULAR LENS PLACEMENT ([place])  LEFT DIABETIC;  Surgeon: Leandrew Koyanagi, MD;  Location: [facility];  Service: Ophthalmology;  Laterality: Left;  Diabetic - insulin  . TUBAL LIGATION    . Vascular Stent in right leg      FAMILY HISTORY: Family History  Problem Relation Age of Onset  . Hypertension Mother   . Diabetes Mother        Diabetes mellitus Type 2  . Alzheimer's disease Mother   . Cancer Son        died at age 6, esophagus    ADVANCED DIRECTIVES (Y/N):  N  HEALTH MAINTENANCE: Social History   Tobacco Use  . Smoking status: Former Smoker    Packs/day: 0.50    Years: 30.00    Pack years: 15.00    Types: Cigarettes    Quit date: 03/18/2007    Years since quitting: 12.2  . Smokeless tobacco: Never Used  . Tobacco comment: Quit smoking in 2008  Substance Use Topics  . Alcohol use: No    Alcohol/week: 0.0 standard drinks  . Drug use: No     Colonoscopy:  PAP:  Bone density:  Lipid panel:  No Known Allergies  Current Outpatient Medications  Medication Sig Dispense Refill  . allopurinol (ZYLOPRIM) 100 MG tablet TAKE 2 TABLETS(200 MG) BY  MOUTH TWICE DAILY 360 tablet 3  . ALPRAZolam (XANAX) 0.25 MG tablet Take 1 tablet (0.25 mg total) by mouth every 6 (six) hours as needed. 30 tablet 1  . amLODipine (NORVASC) 10 MG tablet Take 10 mg by mouth daily.    Marland Kitchen aspirin EC 81 MG tablet Take 81 mg by mouth daily.     Marland Kitchen azithromycin (ZITHROMAX) 250 MG tablet Take 2 tablets PO on day one, and one tablet PO daily thereafter until completed. 6 tablet 0  . cyproheptadine (PERIACTIN) 4  MG tablet Start 1/2 tablet three times daily for one week, then increase to 1 tablet three times daily (Patient taking differently: Take 4 mg by mouth 3 (three) times daily. Start 1/2 tablet three times daily for one week, then increase to 1 tablet three times daily) 90 tablet 1  . donepezil (ARICEPT) 5 MG tablet Take 1 tablet (5 mg total) by mouth at bedtime. 30 tablet 4  . hydrochlorothiazide (HYDRODIURIL) 12.5 MG tablet TK 1 T PO QAM UTD FOR LEG SWELLING  11  . insulin NPH Human (NOVOLIN N) 100 UNIT/ML injection Inject 6-7 Units into the skin 2 (two) times daily before a meal.     . insulin regular (NOVOLIN R,HUMULIN R) 100 units/mL injection Inject 5-9 Units into the skin 3 (three) times daily before meals. 6 units in AM, 8 units at lunch and 10 units in PM    . Insulin Syringe-Needle U-100 (INSULIN SYRINGE .5CC/30GX5/16") 30G X 5/16" 0.5 ML MISC U QID UTD  5  . irbesartan (AVAPRO) 300 MG tablet Take 300 mg by mouth every evening.  8  . levothyroxine (SYNTHROID, LEVOTHROID) 50 MCG tablet Take 1 tablet by mouth daily.  5  . lovastatin (MEVACOR) 20 MG tablet TAKE 1 TABLET BY MOUTH DAILY 90 tablet 3  . megestrol (MEGACE) 20 MG tablet Take 1 tablet (20 mg total) by mouth daily. 30 tablet 5  . montelukast (SINGULAIR) 10 MG tablet TAKE 1 TABLET BY MOUTH EVERY DAY 90 tablet 4  . ondansetron (ZOFRAN) 4 MG tablet Take 1 tablet by mouth every 8 (eight) hours as needed. For nausea    . ONE TOUCH ULTRA TEST test strip TEST THREE TIMES DAILY 100 each 5  . ONETOUCH DELICA LANCETS 10X MISC INJECT AS DIRECTED THREE TIMES DAILY    . potassium chloride SA (K-DUR,KLOR-CON) 20 MEQ tablet Take 1 tablet by mouth daily.  2  . torsemide (DEMADEX) 20 MG tablet Take 20 mg by mouth daily.      No current facility-administered medications for this visit.    OBJECTIVE: There were no vitals filed for this visit.   There is no height or weight on file to calculate BMI.    ECOG FS:1 - Symptomatic but completely  ambulatory  General: Thin, no acute distress.  Sitting in a wheelchair. Eyes: Pink conjunctiva, anicteric sclera. HEENT: Normocephalic, moist mucous membranes. Lungs: No audible wheezing or coughing. Heart: Regular rate and rhythm. Abdomen: Soft, nontender, no obvious distention. Musculoskeletal: No edema, cyanosis, or clubbing. Neuro: Alert, answering all questions appropriately. Cranial nerves grossly intact. Skin: No rashes or petechiae noted. Psych: Normal affect. Lymphatics: No cervical, calvicular, axillary or inguinal LAD.   LAB RESULTS:  Lab Results  Component Value Date   NA 142 06/25/2019   K 3.4 (L) 06/25/2019   CL 105 06/25/2019   CO2 24 06/25/2019   GLUCOSE 152 (H) 06/25/2019   BUN 17 06/25/2019   CREATININE 1.88 (H) 06/25/2019   CALCIUM  9.7 06/25/2019   PROT 7.8 06/25/2019   ALBUMIN 4.0 06/25/2019   AST 15 06/25/2019   ALT 10 06/25/2019   ALKPHOS 109 06/25/2019   BILITOT 1.5 (H) 06/25/2019   GFRNONAA 25 (L) 06/25/2019   GFRAA 29 (L) 06/25/2019    Lab Results  Component Value Date   WBC 10.5 06/25/2019   NEUTROABS 29.7 (H) 03/17/2017   HGB 13.6 06/25/2019   HCT 42.7 06/25/2019   MCV 76.9 (L) 06/25/2019   PLT 283 06/25/2019     STUDIES: CT Abdomen Pelvis Wo Contrast  Result Date: 06/15/2019 CLINICAL DATA:  Follow-up lung nodule, abdominal pain EXAM: CT CHEST, ABDOMEN AND PELVIS WITHOUT CONTRAST TECHNIQUE: Multidetector CT imaging of the chest, abdomen and pelvis was performed following the standard protocol without IV contrast. COMPARISON:  Chest CT 2019, abdominal CT 2014 FINDINGS: CT CHEST FINDINGS Cardiovascular: Normal heart size. New moderate pericardial effusion. Coronary and aortic calcification. Mediastinum/Nodes: Possible enlarged node anterior to the carina measuring 2.5 cm. There is also increased subcarinal soft tissue density. Limited evaluation for hilar adenopathy due to lack of intravenous contrast. There are calcified left hilar lymph  nodes. No axillary adenopathy. Lungs/Pleura: New small to moderate left pleural effusion and adjacent atelectasis. Masslike consolidation within the lingula including the area of previously seen spiculated mass. Emphysema with bullous changes. Soft tissue density associated with cystic cavity of the right lower lobe again identified and is probably not neoplastic. Musculoskeletal: No aggressive osseous lesion. CT ABDOMEN PELVIS FINDINGS Hepatobiliary: Small hypoattenuating liver lesions were present on 2014 study and therefore benign. Gallbladder is unremarkable. No biliary dilatation. Pancreas: Atrophic and calcified likely reflecting sequelae of chronic pancreatitis. Spleen: Unremarkable. Adrenals/Urinary Tract: Bilateral nonobstructing renal calculi. Adrenals are unremarkable. Stomach/Bowel: Stomach is within normal limits. Bowel is normal in caliber. Appendix is not visualized. Vascular/Lymphatic: Marked aortic atherosclerosis. No adenopathy identified within limitation of noncontrast study. Reproductive: Grossly unremarkable. Other: Nonspecific soft tissue thickening in the subcutaneous fat of the low ventral abdominal wall. Musculoskeletal: No aggressive osseous lesion. IMPRESSION: New small to moderate left pleural effusion with adjacent atelectasis. Increase in size of masslike consolidation within the lingula including the area of previously seen mass and likely reflecting progression of disease. New moderate pericardial effusion. Possible enlarged precarinal and subcarinal lymph nodes. Evaluation of the mediastinum and hila suboptimal on this noncontrast study. Chronic intra-abdominal findings detailed above. Electronically Signed   By: Macy Mis M.D.   On: 06/15/2019 16:53   DG Chest 2 View  Result Date: 06/25/2019 CLINICAL DATA:  Lung nodule EXAM: CHEST - 2 VIEW COMPARISON:  04/16/2017, chest CT 06/15/2019 FINDINGS: Left pleural effusion with left basilar atelectasis and lingular consolidation  better evaluated on recent chest CT. Emphysema. Normal heart size. No acute osseous abnormality. IMPRESSION: Left pleural effusion with left basilar atelectasis. Lingular masslike consolidation better evaluated on recent CT. Electronically Signed   By: Macy Mis M.D.   On: 06/25/2019 11:53   CT Head Wo Contrast  Result Date: 06/25/2019 CLINICAL DATA:  Altered mental status EXAM: CT HEAD WITHOUT CONTRAST TECHNIQUE: Contiguous axial images were obtained from the base of the skull through the vertex without intravenous contrast. COMPARISON:  March 08, 2007 FINDINGS: Brain: No evidence of acute territorial infarction, hemorrhage, hydrocephalus,extra-axial collection or mass lesion/mass effect. There is dilatation the ventricles and sulci consistent with age-related atrophy. Low-attenuation changes in the deep white matter consistent with small vessel ischemia. Vascular: No hyperdense vessel or unexpected calcification. Skull: The skull is intact. No fracture or focal  lesion identified. Sinuses/Orbits: The visualized paranasal sinuses and mastoid air cells are clear. The orbits and globes intact. Other: None IMPRESSION: No acute intracranial abnormality. Findings consistent with age related atrophy and chronic small vessel ischemia Electronically Signed   By: Prudencio Pair M.D.   On: 06/25/2019 11:43   CT CHEST WO CONTRAST  Result Date: 06/15/2019 CLINICAL DATA:  Follow-up lung nodule, abdominal pain EXAM: CT CHEST, ABDOMEN AND PELVIS WITHOUT CONTRAST TECHNIQUE: Multidetector CT imaging of the chest, abdomen and pelvis was performed following the standard protocol without IV contrast. COMPARISON:  Chest CT 2019, abdominal CT 2014 FINDINGS: CT CHEST FINDINGS Cardiovascular: Normal heart size. New moderate pericardial effusion. Coronary and aortic calcification. Mediastinum/Nodes: Possible enlarged node anterior to the carina measuring 2.5 cm. There is also increased subcarinal soft tissue density. Limited  evaluation for hilar adenopathy due to lack of intravenous contrast. There are calcified left hilar lymph nodes. No axillary adenopathy. Lungs/Pleura: New small to moderate left pleural effusion and adjacent atelectasis. Masslike consolidation within the lingula including the area of previously seen spiculated mass. Emphysema with bullous changes. Soft tissue density associated with cystic cavity of the right lower lobe again identified and is probably not neoplastic. Musculoskeletal: No aggressive osseous lesion. CT ABDOMEN PELVIS FINDINGS Hepatobiliary: Small hypoattenuating liver lesions were present on 2014 study and therefore benign. Gallbladder is unremarkable. No biliary dilatation. Pancreas: Atrophic and calcified likely reflecting sequelae of chronic pancreatitis. Spleen: Unremarkable. Adrenals/Urinary Tract: Bilateral nonobstructing renal calculi. Adrenals are unremarkable. Stomach/Bowel: Stomach is within normal limits. Bowel is normal in caliber. Appendix is not visualized. Vascular/Lymphatic: Marked aortic atherosclerosis. No adenopathy identified within limitation of noncontrast study. Reproductive: Grossly unremarkable. Other: Nonspecific soft tissue thickening in the subcutaneous fat of the low ventral abdominal wall. Musculoskeletal: No aggressive osseous lesion. IMPRESSION: New small to moderate left pleural effusion with adjacent atelectasis. Increase in size of masslike consolidation within the lingula including the area of previously seen mass and likely reflecting progression of disease. New moderate pericardial effusion. Possible enlarged precarinal and subcarinal lymph nodes. Evaluation of the mediastinum and hila suboptimal on this noncontrast study. Chronic intra-abdominal findings detailed above. Electronically Signed   By: Macy Mis M.D.   On: 06/15/2019 16:53    ASSESSMENT:  Nodule of the lower lobe left lung.  PLAN:    1. Nodule of the lower lobe left lung: CT scan results  from June 15, 2019 reviewed independently and report as above with persistent suspicion of malignancy in lingula of left lower lobe.  Patient also has possible enlarged precarinal and subcarinal lymph nodes.  She has a PET scan scheduled for Wednesday, July 07, 2019 for further evaluation.  If PET scan is negative, no further intervention will be needed.  If PET positive, patient will likely require biopsy to obtain a diagnosis.  Return to clinic 1 week from today to discuss her results and additional diagnostic planning if needed. 2.  Weight loss: Encouraged adding dietary supplements such as boost or Ensure to maintain.  Will readdress at clinic visit next week.  Consider dietary consult if needed.  I spent a total of 60 minutes reviewing chart data, face-to-face evaluation with the patient, counseling and coordination of care as detailed above.   Patient expressed understanding and was in agreement with this plan. She also understands that She can call clinic at any time with any questions, concerns, or complaints.   Cancer Staging No matching staging information was found for the patient.  Lloyd Huger, MD  07/02/2019 7:02 AM

## 2019-07-06 ENCOUNTER — Telehealth: Payer: Self-pay | Admitting: Family Medicine

## 2019-07-06 ENCOUNTER — Telehealth: Payer: Self-pay

## 2019-07-06 DIAGNOSIS — E039 Hypothyroidism, unspecified: Secondary | ICD-10-CM | POA: Diagnosis not present

## 2019-07-06 DIAGNOSIS — J309 Allergic rhinitis, unspecified: Secondary | ICD-10-CM | POA: Diagnosis not present

## 2019-07-06 DIAGNOSIS — M719 Bursopathy, unspecified: Secondary | ICD-10-CM | POA: Diagnosis not present

## 2019-07-06 DIAGNOSIS — E1122 Type 2 diabetes mellitus with diabetic chronic kidney disease: Secondary | ICD-10-CM | POA: Diagnosis not present

## 2019-07-06 DIAGNOSIS — N390 Urinary tract infection, site not specified: Secondary | ICD-10-CM | POA: Diagnosis not present

## 2019-07-06 DIAGNOSIS — M1030 Gout due to renal impairment, unspecified site: Secondary | ICD-10-CM | POA: Diagnosis not present

## 2019-07-06 DIAGNOSIS — I129 Hypertensive chronic kidney disease with stage 1 through stage 4 chronic kidney disease, or unspecified chronic kidney disease: Secondary | ICD-10-CM | POA: Diagnosis not present

## 2019-07-06 DIAGNOSIS — E1151 Type 2 diabetes mellitus with diabetic peripheral angiopathy without gangrene: Secondary | ICD-10-CM | POA: Diagnosis not present

## 2019-07-06 DIAGNOSIS — D631 Anemia in chronic kidney disease: Secondary | ICD-10-CM | POA: Diagnosis not present

## 2019-07-06 DIAGNOSIS — N184 Chronic kidney disease, stage 4 (severe): Secondary | ICD-10-CM | POA: Diagnosis not present

## 2019-07-06 DIAGNOSIS — R413 Other amnesia: Secondary | ICD-10-CM

## 2019-07-06 DIAGNOSIS — M1990 Unspecified osteoarthritis, unspecified site: Secondary | ICD-10-CM | POA: Diagnosis not present

## 2019-07-06 DIAGNOSIS — K5900 Constipation, unspecified: Secondary | ICD-10-CM | POA: Diagnosis not present

## 2019-07-06 NOTE — Telephone Encounter (Signed)
Verbal orders given to [facility] with [facility]

## 2019-07-06 NOTE — Telephone Encounter (Signed)
Home Health Verbal Orders -  Caller/Agency: [place] Number: [phone] Requesting Skilled Nursing Frequency: 1x a week for 4 weeks and   3x's a week for 4 weeks for home aid

## 2019-07-06 NOTE — Telephone Encounter (Signed)
Copied from [place] [id number]. Topic: General - Other >> Jul 06, 2019  2:22 PM Oneta Rack wrote: Lind Covert [phone] requesting clinical advice regarding ensure intake stating patient weighs 85lbs, requesting donepezil (ARICEPT) increase from 5 MG to 10 MG due to memory loss not improving. Home Health Aid advised PCP to send in a new Rx for glucose monitor kit, strips, meter, insulin. Also in need of clinical advice regarding how much insulin patient should be taking/

## 2019-07-06 NOTE — Telephone Encounter (Signed)
That's fine

## 2019-07-07 ENCOUNTER — Encounter
Admission: RE | Admit: 2019-07-07 | Discharge: 2019-07-07 | Disposition: A | Discharge status: Home / Self Care | Payer: Medicare Other | Source: Ambulatory Visit | Attending: Oncology | Admitting: Oncology

## 2019-07-07 ENCOUNTER — Other Ambulatory Visit: Payer: Self-pay

## 2019-07-07 DIAGNOSIS — R918 Other nonspecific abnormal finding of lung field: Secondary | ICD-10-CM | POA: Insufficient documentation

## 2019-07-07 DIAGNOSIS — J90 Pleural effusion, not elsewhere classified: Secondary | ICD-10-CM | POA: Diagnosis not present

## 2019-07-07 DIAGNOSIS — R911 Solitary pulmonary nodule: Secondary | ICD-10-CM

## 2019-07-07 DIAGNOSIS — K861 Other chronic pancreatitis: Secondary | ICD-10-CM | POA: Insufficient documentation

## 2019-07-07 DIAGNOSIS — R590 Localized enlarged lymph nodes: Secondary | ICD-10-CM | POA: Insufficient documentation

## 2019-07-07 LAB — GLUCOSE, CAPILLARY: Glucose-Capillary: 139 mg/dL — ABNORMAL HIGH (ref 70–99)

## 2019-07-07 MED ORDER — FLUDEOXYGLUCOSE F - 18 (FDG) INJECTION
5.2000 | Freq: Once | INTRAVENOUS | Status: AC | PRN
  Administered 2019-07-07: 5.61 via INTRAVENOUS

## 2019-07-07 MED ORDER — DONEPEZIL HCL 10 MG PO TABS: 10.0000 mg | ORAL_TABLET | Freq: Every day | ORAL | 5 refills | Status: DC

## 2019-07-07 NOTE — Telephone Encounter (Signed)
Tried calling Lind Covert at number listed below. Left message to call back. OK for [facility] triage to advise [name] of message below when she returns call.

## 2019-07-07 NOTE — Telephone Encounter (Signed)
Have sent prescription for 10mg  Aricept to pharmacy. She should consume 4 ensures every day.  Dr. Nilda Simmer manages patients diabetes and insulin. According Dr. Nash Dimmer most recent note, patient is taking:  . insulin NPH (NOVOLIN N NPH U-100 INSULIN)  5-6 units every morning and 8 units every evening. ) 10 mL 12  . insulin REGULAR (HUMULIN R,NOVOLIN R)  6 or 8 units before lunch only) If patient is not sure, they should probably contact Dr. Nash Dimmer office to find out for sure what she is supposed to taking.

## 2019-07-07 NOTE — Telephone Encounter (Signed)
Spoke with Lind Covert regarding message from PCP. She will double check dosing with  Dr Nilda Simmer to make sure recent notes about medications are correct- she also wants to talk about pens usage and try to get away from vials.  She is requesting new glucose monitor, test strip/lancet supplies be called to pharmacy and she would like PCP to do that for her. Told her I would send her request.

## 2019-07-07 NOTE — Progress Notes (Signed)
PET scan

## 2019-07-08 ENCOUNTER — Ambulatory Visit: Payer: Self-pay | Admitting: *Deleted

## 2019-07-08 ENCOUNTER — Telehealth: Payer: Self-pay | Admitting: Oncology

## 2019-07-08 ENCOUNTER — Emergency Department (HOSPITAL_COMMUNITY): Payer: Medicare Other

## 2019-07-08 ENCOUNTER — Inpatient Hospital Stay (HOSPITAL_COMMUNITY): Payer: Medicare Other

## 2019-07-08 ENCOUNTER — Inpatient Hospital Stay: Payer: Medicare Other | Admitting: Oncology

## 2019-07-08 ENCOUNTER — Inpatient Hospital Stay (HOSPITAL_COMMUNITY)
Admit: 2019-07-08 | Discharge: 2019-07-08 | Disposition: A | Discharge status: Home / Self Care | Payer: Medicare Other | Attending: Student in an Organized Health Care Education/Training Program | Admitting: Student in an Organized Health Care Education/Training Program

## 2019-07-08 ENCOUNTER — Encounter (HOSPITAL_COMMUNITY): Payer: Self-pay

## 2019-07-08 ENCOUNTER — Other Ambulatory Visit: Payer: Self-pay

## 2019-07-08 ENCOUNTER — Inpatient Hospital Stay (HOSPITAL_COMMUNITY)
Admission: EM | Admit: 2019-07-08 | Discharge: 2019-07-14 | DRG: 092 | Disposition: A | Discharge status: Home Health | Payer: Medicare Other | Attending: Family Medicine | Admitting: Family Medicine

## 2019-07-08 DIAGNOSIS — R627 Adult failure to thrive: Secondary | ICD-10-CM | POA: Diagnosis not present

## 2019-07-08 DIAGNOSIS — Z820 Family history of epilepsy and other diseases of the nervous system: Secondary | ICD-10-CM

## 2019-07-08 DIAGNOSIS — Z20822 Contact with and (suspected) exposure to covid-19: Secondary | ICD-10-CM | POA: Diagnosis not present

## 2019-07-08 DIAGNOSIS — Z961 Presence of intraocular lens: Secondary | ICD-10-CM | POA: Diagnosis present

## 2019-07-08 DIAGNOSIS — E1151 Type 2 diabetes mellitus with diabetic peripheral angiopathy without gangrene: Secondary | ICD-10-CM | POA: Diagnosis not present

## 2019-07-08 DIAGNOSIS — C3490 Malignant neoplasm of unspecified part of unspecified bronchus or lung: Secondary | ICD-10-CM | POA: Diagnosis not present

## 2019-07-08 DIAGNOSIS — Z9842 Cataract extraction status, left eye: Secondary | ICD-10-CM | POA: Diagnosis not present

## 2019-07-08 DIAGNOSIS — R05 Cough: Secondary | ICD-10-CM | POA: Diagnosis not present

## 2019-07-08 DIAGNOSIS — M109 Gout, unspecified: Secondary | ICD-10-CM | POA: Diagnosis not present

## 2019-07-08 DIAGNOSIS — Z95828 Presence of other vascular implants and grafts: Secondary | ICD-10-CM

## 2019-07-08 DIAGNOSIS — Z833 Family history of diabetes mellitus: Secondary | ICD-10-CM | POA: Diagnosis not present

## 2019-07-08 DIAGNOSIS — E11319 Type 2 diabetes mellitus with unspecified diabetic retinopathy without macular edema: Secondary | ICD-10-CM | POA: Diagnosis not present

## 2019-07-08 DIAGNOSIS — E1122 Type 2 diabetes mellitus with diabetic chronic kidney disease: Secondary | ICD-10-CM | POA: Diagnosis present

## 2019-07-08 DIAGNOSIS — G92 Toxic encephalopathy: Principal | ICD-10-CM | POA: Diagnosis present

## 2019-07-08 DIAGNOSIS — R4182 Altered mental status, unspecified: Secondary | ICD-10-CM | POA: Diagnosis present

## 2019-07-08 DIAGNOSIS — Z7982 Long term (current) use of aspirin: Secondary | ICD-10-CM | POA: Diagnosis not present

## 2019-07-08 DIAGNOSIS — Z792 Long term (current) use of antibiotics: Secondary | ICD-10-CM | POA: Diagnosis not present

## 2019-07-08 DIAGNOSIS — Z743 Need for continuous supervision: Secondary | ICD-10-CM | POA: Diagnosis not present

## 2019-07-08 DIAGNOSIS — Z794 Long term (current) use of insulin: Secondary | ICD-10-CM

## 2019-07-08 DIAGNOSIS — R29810 Facial weakness: Secondary | ICD-10-CM | POA: Diagnosis not present

## 2019-07-08 DIAGNOSIS — I129 Hypertensive chronic kidney disease with stage 1 through stage 4 chronic kidney disease, or unspecified chronic kidney disease: Secondary | ICD-10-CM | POA: Diagnosis present

## 2019-07-08 DIAGNOSIS — F0390 Unspecified dementia without behavioral disturbance: Secondary | ICD-10-CM | POA: Diagnosis present

## 2019-07-08 DIAGNOSIS — R531 Weakness: Secondary | ICD-10-CM | POA: Diagnosis not present

## 2019-07-08 DIAGNOSIS — I959 Hypotension, unspecified: Secondary | ICD-10-CM | POA: Diagnosis not present

## 2019-07-08 DIAGNOSIS — I10 Essential (primary) hypertension: Secondary | ICD-10-CM | POA: Diagnosis not present

## 2019-07-08 DIAGNOSIS — R4020 Unspecified coma: Secondary | ICD-10-CM | POA: Diagnosis not present

## 2019-07-08 DIAGNOSIS — Z87891 Personal history of nicotine dependence: Secondary | ICD-10-CM | POA: Diagnosis not present

## 2019-07-08 DIAGNOSIS — E785 Hyperlipidemia, unspecified: Secondary | ICD-10-CM | POA: Diagnosis not present

## 2019-07-08 DIAGNOSIS — Z79899 Other long term (current) drug therapy: Secondary | ICD-10-CM

## 2019-07-08 DIAGNOSIS — R0902 Hypoxemia: Secondary | ICD-10-CM | POA: Diagnosis not present

## 2019-07-08 DIAGNOSIS — E039 Hypothyroidism, unspecified: Secondary | ICD-10-CM | POA: Diagnosis not present

## 2019-07-08 DIAGNOSIS — N184 Chronic kidney disease, stage 4 (severe): Secondary | ICD-10-CM | POA: Diagnosis not present

## 2019-07-08 DIAGNOSIS — R29818 Other symptoms and signs involving the nervous system: Secondary | ICD-10-CM | POA: Diagnosis not present

## 2019-07-08 DIAGNOSIS — Z79890 Hormone replacement therapy (postmenopausal): Secondary | ICD-10-CM

## 2019-07-08 DIAGNOSIS — Z8249 Family history of ischemic heart disease and other diseases of the circulatory system: Secondary | ICD-10-CM

## 2019-07-08 DIAGNOSIS — R64 Cachexia: Secondary | ICD-10-CM | POA: Diagnosis present

## 2019-07-08 LAB — CBC
HCT: 37.6 % (ref 36.0–46.0)
Hemoglobin: 11.6 g/dL — ABNORMAL LOW (ref 12.0–15.0)
MCH: 24.7 pg — ABNORMAL LOW (ref 26.0–34.0)
MCHC: 30.9 g/dL (ref 30.0–36.0)
MCV: 80 fL (ref 80.0–100.0)
Platelets: 198 10*3/uL (ref 150–400)
RBC: 4.7 MIL/uL (ref 3.87–5.11)
RDW: 21.2 % — ABNORMAL HIGH (ref 11.5–15.5)
WBC: 14.3 10*3/uL — ABNORMAL HIGH (ref 4.0–10.5)
nRBC: 0 % (ref 0.0–0.2)

## 2019-07-08 LAB — URINALYSIS, ROUTINE W REFLEX MICROSCOPIC
Bilirubin Urine: NEGATIVE
Glucose, UA: NEGATIVE mg/dL
Hgb urine dipstick: NEGATIVE
Ketones, ur: NEGATIVE mg/dL
Leukocytes,Ua: NEGATIVE
Nitrite: NEGATIVE
Protein, ur: >=300 mg/dL — AB
Specific Gravity, Urine: 1.011 (ref 1.005–1.030)
pH: 5 (ref 5.0–8.0)

## 2019-07-08 LAB — PROTIME-INR
INR: 1.1 (ref 0.8–1.2)
Prothrombin Time: 14.3 seconds (ref 11.4–15.2)

## 2019-07-08 LAB — DIFFERENTIAL
Abs Immature Granulocytes: 0.1 10*3/uL — ABNORMAL HIGH (ref 0.00–0.07)
Basophils Absolute: 0.1 10*3/uL (ref 0.0–0.1)
Basophils Relative: 1 %
Eosinophils Absolute: 0.4 10*3/uL (ref 0.0–0.5)
Eosinophils Relative: 3 %
Immature Granulocytes: 1 %
Lymphocytes Relative: 17 %
Lymphs Abs: 2.5 10*3/uL (ref 0.7–4.0)
Monocytes Absolute: 1.3 10*3/uL — ABNORMAL HIGH (ref 0.1–1.0)
Monocytes Relative: 9 %
Neutro Abs: 9.9 10*3/uL — ABNORMAL HIGH (ref 1.7–7.7)
Neutrophils Relative %: 69 %

## 2019-07-08 LAB — COMPREHENSIVE METABOLIC PANEL
ALT: 8 U/L (ref 0–44)
AST: 17 U/L (ref 15–41)
Albumin: 2.9 g/dL — ABNORMAL LOW (ref 3.5–5.0)
Alkaline Phosphatase: 76 U/L (ref 38–126)
Anion gap: 13 (ref 5–15)
BUN: 22 mg/dL (ref 8–23)
CO2: 21 mmol/L — ABNORMAL LOW (ref 22–32)
Calcium: 9 mg/dL (ref 8.9–10.3)
Chloride: 105 mmol/L (ref 98–111)
Creatinine, Ser: 1.78 mg/dL — ABNORMAL HIGH (ref 0.44–1.00)
GFR calc Af Amer: 31 mL/min — ABNORMAL LOW (ref >60)
GFR calc non Af Amer: 27 mL/min — ABNORMAL LOW (ref >60)
Glucose, Bld: 145 mg/dL — ABNORMAL HIGH (ref 70–99)
Potassium: 4.2 mmol/L (ref 3.5–5.1)
Sodium: 139 mmol/L (ref 135–145)
Total Bilirubin: 0.9 mg/dL (ref 0.3–1.2)
Total Protein: 6.1 g/dL — ABNORMAL LOW (ref 6.5–8.1)

## 2019-07-08 LAB — RAPID URINE DRUG SCREEN, HOSP PERFORMED
Amphetamines: NOT DETECTED
Barbiturates: NOT DETECTED
Benzodiazepines: NOT DETECTED
Cocaine: NOT DETECTED
Opiates: NOT DETECTED
Tetrahydrocannabinol: NOT DETECTED

## 2019-07-08 LAB — I-STAT CHEM 8, ED
BUN: 23 mg/dL (ref 8–23)
Calcium, Ion: 1.2 mmol/L (ref 1.15–1.40)
Chloride: 105 mmol/L (ref 98–111)
Creatinine, Ser: 1.6 mg/dL — ABNORMAL HIGH (ref 0.44–1.00)
Glucose, Bld: 149 mg/dL — ABNORMAL HIGH (ref 70–99)
HCT: 37 % (ref 36.0–46.0)
Hemoglobin: 12.6 g/dL (ref 12.0–15.0)
Potassium: 3.8 mmol/L (ref 3.5–5.1)
Sodium: 140 mmol/L (ref 135–145)
TCO2: 25 mmol/L (ref 22–32)

## 2019-07-08 LAB — PROCALCITONIN: Procalcitonin: <0.1 ng/mL

## 2019-07-08 LAB — GLUCOSE, CAPILLARY
Glucose-Capillary: 110 mg/dL — ABNORMAL HIGH (ref 70–99)
Glucose-Capillary: 155 mg/dL — ABNORMAL HIGH (ref 70–99)

## 2019-07-08 LAB — RESPIRATORY PANEL BY RT PCR (FLU A&B, COVID)
Influenza A by PCR: NEGATIVE
Influenza B by PCR: NEGATIVE
SARS Coronavirus 2 by RT PCR: NEGATIVE

## 2019-07-08 LAB — HEMOGLOBIN A1C
Hgb A1c MFr Bld: 7.9 % — ABNORMAL HIGH (ref 4.8–5.6)
Mean Plasma Glucose: 180.03 mg/dL

## 2019-07-08 LAB — ETHANOL: Alcohol, Ethyl (B): <10 mg/dL (ref <10)

## 2019-07-08 LAB — APTT: aPTT: 24 seconds (ref 24–36)

## 2019-07-08 MED ORDER — ENOXAPARIN SODIUM 30 MG/0.3ML ~~LOC~~ SOLN
30.0000 mg | SUBCUTANEOUS | Status: DC
  Administered 2019-07-08 – 2019-07-13 (×5): 30 mg via SUBCUTANEOUS
  Filled 2019-07-08 (×5): qty 0.3

## 2019-07-08 MED ORDER — BACITRACIN-NEOMYCIN-POLYMYXIN OINTMENT TUBE
TOPICAL_OINTMENT | CUTANEOUS | Status: AC
  Filled 2019-07-08: qty 14.17

## 2019-07-08 MED ORDER — ONDANSETRON HCL 4 MG/2ML IJ SOLN
4.0000 mg | Freq: Once | INTRAMUSCULAR | Status: AC
  Administered 2019-07-08: 4 mg via INTRAVENOUS

## 2019-07-08 MED ORDER — HYDRALAZINE HCL 25 MG PO TABS: 25.0000 mg | ORAL_TABLET | ORAL | Status: DC | PRN

## 2019-07-08 MED ORDER — SODIUM CHLORIDE 0.9 % IV BOLUS
1000.0000 mL | Freq: Once | INTRAVENOUS | Status: AC
  Administered 2019-07-08: 03:00:00 1000 mL via INTRAVENOUS

## 2019-07-08 MED ORDER — GADOBUTROL 1 MMOL/ML IV SOLN
5.0000 mL | Freq: Once | INTRAVENOUS | Status: AC | PRN
  Administered 2019-07-08: 09:00:00 5 mL via INTRAVENOUS

## 2019-07-08 MED ORDER — INSULIN ASPART 100 UNIT/ML ~~LOC~~ SOLN
0.0000 [IU] | Freq: Three times a day (TID) | SUBCUTANEOUS | Status: DC
  Administered 2019-07-09: 2 [IU] via SUBCUTANEOUS
  Administered 2019-07-09: 1 [IU] via SUBCUTANEOUS
  Administered 2019-07-10 (×2): 3 [IU] via SUBCUTANEOUS
  Administered 2019-07-11 (×2): 5 [IU] via SUBCUTANEOUS
  Administered 2019-07-12: 3 [IU] via SUBCUTANEOUS
  Administered 2019-07-12: 1 [IU] via SUBCUTANEOUS
  Administered 2019-07-12 – 2019-07-13 (×2): 3 [IU] via SUBCUTANEOUS

## 2019-07-08 MED ORDER — METOCLOPRAMIDE HCL 5 MG/ML IJ SOLN
5.0000 mg | Freq: Once | INTRAMUSCULAR | Status: AC
  Administered 2019-07-08: 5 mg via INTRAVENOUS
  Filled 2019-07-08: qty 2

## 2019-07-08 MED ORDER — LABETALOL HCL 5 MG/ML IV SOLN
10.0000 mg | INTRAVENOUS | Status: DC | PRN
  Administered 2019-07-08: 10 mg via INTRAVENOUS
  Filled 2019-07-08 (×2): qty 4

## 2019-07-08 NOTE — Telephone Encounter (Signed)
Patient's daughter, Ivin Booty, phoned and stated that she would need to reschedule patient's appt due to patient being sent to the hospital. Appt rescheduled for 07-15-19.

## 2019-07-08 NOTE — Telephone Encounter (Addendum)
I returned Dalene Carrow call (daughter on [place]).   [name] wanted me to go over her mother's medication list with what she has listed in her mother's MyChart list.   Her mother is in [facility] with a suspected stroke.   She went in last night.    I went over the list with her.   I listed the medications below that her mother is no longer taking per [name].   [name] is requesting they be removed from the list so it is less confusing to see what she is and is not taking.  I let her know I would pass her request on to Dr. Caryn Section.   She was agreeable to this.  I forwarded this to Dr. Maralyn Sago office.  Reason for Disposition . [1] Caller has NON-URGENT medication question about med that PCP prescribed AND [2] triager unable to answer question    Needs medication list cleaned up.  Answer Assessment - Initial Assessment Questions 1.   NAME of MEDICATION: "What medicine are you calling about?"     Daughter, Dalene Carrow call was returned.  She had multiple questions regarding her mother's medications.   Her mother is in [facility] with a possible stroke. 2.   QUESTION: "What is your question?"     She is requesting I go through her mother's list of medications and see which ones she is on currently. 3.   PRESCRIBING HCP: "Who prescribed it?" Reason: if prescribed by specialist, call should be referred to that group.     Dr. Caryn Section 4. SYMPTOMS: "Do you have any symptoms?"     No.   She is in the hospital and [name] is seeking clarification of her medications. 5. SEVERITY: If symptoms are present, ask "Are they mild, moderate or severe?"     N/A 6.  PREGNANCY:  "Is there any chance that you are pregnant?" "When was your last menstrual period?"     N/A  [name] is requesting these medications be removed from her mother's medication list because she is not taking them:  Allopurinol, Amlodipine, Azithromycin, HCTZ, Irbesartan, One Touch Ultra Test strips, potassium chloride and  torsemide.  Protocols used: MEDICATION QUESTION CALL-A-AH

## 2019-07-08 NOTE — Hospital Course (Signed)
Caitlyn Marshall is a 79 year old AA female with PMH hypothyroidism, hypertension, hyperlipidemia, type 2 diabetes, CKD 3/4 who was brought to the ER from home after daughter found her gurgling and unresponsive in bed.  She was also noted to have a right-sided facial droop as well as right arm weakness and she was brought to the ER for further evaluation. The patient had also endorsed shortness of breath and vomiting recently at home. No family is present in the ER and history is provided by the patient however she is a very poor historian. She does not remember any of the events of the day and when asked what she last remembers, she mainly states it was going to bed last night.  She woke up this morning feeling "sick".  When asked to elaborate, she could not. Given the aforementioned symptoms, there was concern for stroke upon arrival to the ER and she underwent code stroke work-up.  Neurology evaluated patient bedside and canceled code stroke. Many of her symptoms were resolving as well by the time she presented to the ER. CT head obtained revealed a questionable hypodensity in the right paramedian pons that was concerning for a possible ischemic infarct.  She will undergo brain MRI for further evaluation regarding stroke as well as this area.  In addition, she has also been undergoing outpatient work-up regarding a lung mass.  She has had ongoing unintentional weight loss and poor appetite accompanied with significant lethargy/weakness.  She appears very cachectic on initial evaluation walking into her room.  She also underwent a PET scan on 07/07/2019.  This was notable for the left-sided hypermetabolic mass most likely consistent with underlying malignancy.  There was also a focus in the right lung concerning for similar versus metastasis.  See full report for details.  Notable labs in the ER include leukocytosis, 14.3, hemoglobin 11.6, platelet 198 Na 139, K 4.2, Cl 105, Co2 21, Glucose 145, Scr  1.78 COVID-19 and flu swabs negative  She was treated with 1 L normal saline bolus and nausea was treated with Reglan and Zofran injections in the ER.

## 2019-07-08 NOTE — H&P (Signed)
History and Physical    Caitlyn Marshall WVP:[id number] DOB: 10/10/1940 DOA: 07/08/2019  PCP: Birdie Sons, MD Patient coming from: home  I have personally briefly reviewed patient's old medical records in [place]  Chief Complaint: Found unresponsive by daughter  HPI: Caitlyn Marshall is a 79 year old AA female with PMH hypothyroidism, hypertension, hyperlipidemia, type 2 diabetes, CKD 3/4 who was brought to the ER from home after daughter found her gurgling and unresponsive in bed.  She was also noted to have a right-sided facial droop as well as right arm weakness and she was brought to the ER for further evaluation. The patient had also endorsed shortness of breath and vomiting recently at home. No family is present in the ER and history is provided by the patient however she is a very poor historian. She does not remember any of the events of the day and when asked what she last remembers, she mainly states it was going to bed last night.  She woke up this morning feeling "sick".  When asked to elaborate, she could not. Given the aforementioned symptoms, there was concern for stroke upon arrival to the ER and she underwent code stroke work-up.  Neurology evaluated patient bedside and canceled code stroke. Many of her symptoms were resolving as well by the time she presented to the ER. CT head obtained revealed a questionable hypodensity in the right paramedian pons that was concerning for a possible ischemic infarct.  She will undergo brain MRI for further evaluation regarding stroke as well as this area.  In addition, she has also been undergoing outpatient work-up regarding a lung mass.  She has had ongoing unintentional weight loss and poor appetite accompanied with significant lethargy/weakness.  She appears very cachectic on initial evaluation walking into her room.  She also underwent a PET scan on 07/07/2019.  This was notable for the left-sided hypermetabolic mass most likely  consistent with underlying malignancy.  There was also a focus in the right lung concerning for similar versus metastasis.  See full report for details.  Notable labs in the ER include leukocytosis, 14.3, hemoglobin 11.6, platelet 198 Na 139, K 4.2, Cl 105, Co2 21, Glucose 145, Scr 1.78 COVID-19 and flu swabs negative  She was treated with 1 L normal saline bolus and nausea was treated with Reglan and Zofran injections in the ER.    Review of Systems: As per HPI otherwise 10 point review of systems negative.   Past Medical History:  Diagnosis Date  . Chronic kidney disease   . Diabetes mellitus without complication ([place])   . Gout   . Hyperlipidemia   . Hypertension   . Hypothyroidism   . Thyroid disease     Past Surgical History:  Procedure Laterality Date  . CATARACT EXTRACTION W/PHACO Left 05/26/2018   Procedure: CATARACT EXTRACTION PHACO AND INTRAOCULAR LENS PLACEMENT ([place])  LEFT DIABETIC;  Surgeon: Leandrew Koyanagi, MD;  Location: [facility];  Service: Ophthalmology;  Laterality: Left;  Diabetic - insulin  . TUBAL LIGATION    . Vascular Stent in right leg       reports that she quit smoking about 12 years ago. Her smoking use included cigarettes. She has a 15.00 pack-year smoking history. She has never used smokeless tobacco. She reports that she does not drink alcohol or use drugs.  No Known Allergies  Family History  Problem Relation Age of Onset  . Hypertension Mother   . Diabetes Mother  Diabetes mellitus Type 2  . Alzheimer's disease Mother   . Cancer Son        died at age 74, esophagus    Prior to Admission medications   Medication Sig Start Date End Date Taking? Authorizing Provider  allopurinol (ZYLOPRIM) 100 MG tablet TAKE 2 TABLETS(200 MG) BY MOUTH TWICE DAILY 11/13/18   Birdie Sons, MD  ALPRAZolam Duanne Moron) 0.25 MG tablet Take 1 tablet (0.25 mg total) by mouth every 6 (six) hours as needed. 06/14/19   Birdie Sons, MD  amLODipine  (NORVASC) 10 MG tablet Take 10 mg by mouth daily. 12/21/14   [provider]  aspirin EC 81 MG tablet Take 81 mg by mouth daily.     [provider]  azithromycin (ZITHROMAX) 250 MG tablet Take 2 tablets PO on day one, and one tablet PO daily thereafter until completed. 06/28/19   Mar Daring, PA-C  cyproheptadine (PERIACTIN) 4 MG tablet Start 1/2 tablet three times daily for one week, then increase to 1 tablet three times daily Patient taking differently: Take 4 mg by mouth 3 (three) times daily. Start 1/2 tablet three times daily for one week, then increase to 1 tablet three times daily 09/07/18   Birdie Sons, MD  donepezil (ARICEPT) 10 MG tablet Take 1 tablet (10 mg total) by mouth at bedtime. 07/07/19   Birdie Sons, MD  hydrochlorothiazide (HYDRODIURIL) 12.5 MG tablet TK 1 T PO QAM UTD FOR LEG SWELLING 11/16/14   [provider]  insulin NPH Human (NOVOLIN N) 100 UNIT/ML injection Inject 6-7 Units into the skin 2 (two) times daily before a meal.     [provider]  insulin regular (NOVOLIN R,HUMULIN R) 100 units/mL injection Inject 5-9 Units into the skin 3 (three) times daily before meals. 6 units in AM, 8 units at lunch and 10 units in PM    [provider]  Insulin Syringe-Needle U-100 (INSULIN SYRINGE .5CC/30GX5/16") 30G X 5/16" 0.5 ML MISC U QID UTD 09/13/14   [provider]  irbesartan (AVAPRO) 300 MG tablet Take 300 mg by mouth every evening. 01/02/17   [provider]  levothyroxine (SYNTHROID, LEVOTHROID) 50 MCG tablet Take 1 tablet by mouth daily. 10/21/14   [provider]  lovastatin (MEVACOR) 20 MG tablet TAKE 1 TABLET BY MOUTH DAILY 03/01/19   Birdie Sons, MD  megestrol (MEGACE) 20 MG tablet Take 1 tablet (20 mg total) by mouth daily. 05/31/19   Birdie Sons, MD  montelukast (SINGULAIR) 10 MG tablet TAKE 1 TABLET BY MOUTH EVERY DAY 05/29/19   Birdie Sons, MD  ondansetron (ZOFRAN) 4 MG  tablet Take 1 tablet by mouth every 8 (eight) hours as needed. For nausea 08/12/18   [provider]  ONE TOUCH ULTRA TEST test strip TEST THREE TIMES DAILY 09/06/14   Jerrol Banana., MD  [facility] DELICA LANCETS 02I MISC INJECT AS DIRECTED THREE TIMES DAILY 11/30/14   [provider]  potassium chloride SA (K-DUR,KLOR-CON) 20 MEQ tablet Take 1 tablet by mouth daily. 09/16/17   [provider]  torsemide (DEMADEX) 20 MG tablet Take 20 mg by mouth daily.  10/08/18   [provider]    Physical Exam: Vitals:   07/08/19 0500 07/08/19 0515 07/08/19 0530 07/08/19 0545  BP: (!) 159/60 (!) 153/71 (!) 140/58 (!) 150/56  Pulse: 80 79 81 78  Resp: 16 20 15 17   Temp:      TempSrc:  SpO2: 95% 96% 95% 94%   General appearance: Extremely cachectic and chronically ill-appearing elderly woman lying in bed in no distress but appears very weak and lethargic Head: Normocephalic, without obvious abnormality Eyes: EOMI Lungs: scattered coarse breath sounds bilaterally Heart: regular rate and rhythm and S1, S2 normal Abdomen: thin, soft, NT, ND, BS present Extremities: extremely thin, no edema Skin: hanging from bondes in some places Neurologic: No focal deficits  Labs on Admission: I have personally reviewed following labs and imaging studies  CBC: Recent Labs  Lab 07/08/19 0200 07/08/19 0206  WBC 14.3*  --   NEUTROABS 9.9*  --   HGB 11.6* 12.6  HCT 37.6 37.0  MCV 80.0  --   PLT 198  --    Basic Metabolic Panel: Recent Labs  Lab 07/08/19 0200 07/08/19 0206  NA 139 140  K 4.2 3.8  CL 105 105  CO2 21*  --   GLUCOSE 145* 149*  BUN 22 23  CREATININE 1.78* 1.60*  CALCIUM 9.0  --    GFR: CrCl cannot be calculated (Unknown ideal weight.). Liver Function Tests: Recent Labs  Lab 07/08/19 0200  AST 17  ALT 8  ALKPHOS 76  BILITOT 0.9  PROT 6.1*  ALBUMIN 2.9*   No results for input(s): LIPASE, AMYLASE in the last 168 hours. No results for  input(s): AMMONIA in the last 168 hours. Coagulation Profile: Recent Labs  Lab 07/08/19 0200  INR 1.1   Cardiac Enzymes: No results for input(s): CKTOTAL, CKMB, CKMBINDEX, TROPONINI in the last 168 hours. BNP (last 3 results) No results for input(s): PROBNP in the last 8760 hours. HbA1C: No results for input(s): HGBA1C in the last 72 hours. CBG: Recent Labs  Lab 07/07/19 0921  GLUCAP 139*   Lipid Profile: No results for input(s): CHOL, HDL, LDLCALC, TRIG, CHOLHDL, LDLDIRECT in the last 72 hours. Thyroid Function Tests: No results for input(s): TSH, T4TOTAL, FREET4, T3FREE, THYROIDAB in the last 72 hours. Anemia Panel: No results for input(s): VITAMINB12, FOLATE, FERRITIN, TIBC, IRON, RETICCTPCT in the last 72 hours. Urine analysis:    Component Value Date/Time   COLORURINE YELLOW (A) 06/25/2019 1116   APPEARANCEUR HAZY (A) 06/25/2019 1116   APPEARANCEUR Cloudy 05/04/2013 2136   LABSPEC 1.017 06/25/2019 1116   LABSPEC 1.015 05/04/2013 2136   PHURINE 5.0 06/25/2019 1116   GLUCOSEU NEGATIVE 06/25/2019 1116   GLUCOSEU >=500 05/04/2013 2136   HGBUR SMALL (A) 06/25/2019 1116   BILIRUBINUR NEGATIVE 06/25/2019 1116   BILIRUBINUR Negative 05/04/2013 2136   KETONESUR 5 (A) 06/25/2019 1116   PROTEINUR >=300 (A) 06/25/2019 1116   NITRITE NEGATIVE 06/25/2019 1116   LEUKOCYTESUR NEGATIVE 06/25/2019 1116   LEUKOCYTESUR 3+ 05/04/2013 2136    Radiological Exams on Admission: NM PET Image Initial (PI) Skull Base To Thigh  Result Date: 07/07/2019 CLINICAL DATA:  Initial treatment strategy for lung mass. EXAM: NUCLEAR MEDICINE PET SKULL BASE TO THIGH TECHNIQUE: 5.6 mCi F-18 FDG was injected intravenously. Full-ring PET imaging was performed from the skull base to thigh after the radiotracer. CT data was obtained and used for attenuation correction and anatomic localization. Fasting blood glucose: 139 mg/dl COMPARISON:  Prior chest CTs most recent with the chest abdomen and pelvis study  of June 15, 2019 FINDINGS: Mediastinal blood pool activity: SUV max 1.84 Liver activity: SUV max NA NECK: No hypermetabolic lymph nodes in the neck. Incidental CT findings: none CHEST: Left juxta hilar mass in the lingula distorting the fissure and inseparable from the left cardiac  border and extending from the hilum into the left chest measuring approximately 4.8 x 3.8 cm (SUVmax = 7.4) quite similar to its appearance on the diagnostic CT obtained on 06/15/2019 Associated with hypermetabolic right paratracheal, precarinal, subcarinal and left hilar adenopathyw (Image 94, series 3): Hypermetabolic subcarinal lymph node 8 mm short axis (SUVmax = 6.0) Precarinal adenopathy similar size (SUVmax = 5.0) (image 86, series 3 Mildly hypermetabolic high right paratracheal lymph node (image 75, series 3) (SUVmax = 3.2 Left-sided pleural effusion with mild hypermetabolic activity at the periphery most areas corresponding to collapsed lung with metabolic activity slightly greater than blood pool. Mildly increased metabolic activity about the right hilum (image 102, series 3) (SUVmax = 2.8) 7 mm lymph node in this location. Background marked pulmonary emphysema with bullous changes in the right lower lobe. Along the margin of 1 of these areas of bullous disease is an area of nodularity that is unchanged in the short interval measuring approximately 2 x 1.9 cm (image 110, series 3) (SUVmax = 2.3) Incidental CT findings: calcified atheromatous plaque throughout the thoracic aorta. Mass abutting the left cardiac border. Small pericardial effusion. No chest wall lesion. No new finding since the previous CT study. Airways are patent. Left pleural effusion with similar volume to slightly diminished volume. ABDOMEN/PELVIS: Asymmetric renal activity with upper pole activity that extend slightly beyond the contour of the left kidney but without CT correlate. A similar appearance in the upper pole the right kidney where there is cortical  scarring. No hypermetabolic areas in other solid organs. Changes of chronic calcific pancreatitis. No abdominal adenopathy. Crowding of bowel loops in the abdomen due to cachexia and limited intra-abdominal fat limiting assessment. Incidental CT findings: Low-density lesions in the liver likely small cysts. Limited visualization of the adrenal glands which are mildly nodular but not associated with increased FDG uptake beyond liver or blood pool. Numerous bilateral intrarenal calculi and likely parenchymal calculi in the kidneys. No hydronephrosis. No visible renal lesion. Limited assessment of bowel loops due to lack of mesenteric and intra-abdominal fat with similar appearance of visualized bowel accounting for lack of contrast on today's study is compared to the study of 06/15/2019. SKELETON: No focal hypermetabolic activity to suggest skeletal metastasis. Incidental CT findings: Spinal degenerative changes. IMPRESSION: 1. Hypermetabolic mass in the left chest with contralateral mediastinal adenopathy showing hypermetabolic features, suspicious for primary bronchogenic neoplasm. 2. Lesion distorts the fissure and is inseparable from the left heart border. 3. Left effusion without discrete marked hypermetabolic features. Collapsed lung along the anterior margin displaying slightly greater than blood pool activity. 4. Mild increased metabolism in right hilar lymph nodes and a focus of nodularity along bullous changes in the right lower lobe. Finding may represent a second primary or metastatic disease, increased in size since the study of May 01, 2017. Currently approximately 2 x 1.9 cm, area of concern along the anterolateral margin of this cystic and or bullous area previously measuring approximately 15 x 11 mm in 2019. 5. Areas of activity not registering well with the renal profile on CT images likely associated with pooling of FDG within excreted urine in areas of patulous collecting systems or in  dependent collecting systems. Dedicated renal imaging may be helpful for further assessment, ultrasound could be considered given small size of the patient as renal function may not allow for administration of intravenous contrast. 6. Changes of chronic calcific pancreatitis. Electronically Signed   By: Zetta Bills M.D.   On: 07/07/2019 16:12   DG  Chest Port 1 View  Result Date: 07/08/2019 CLINICAL DATA:  Cough. Known lung mass. EXAM: PORTABLE CHEST 1 VIEW COMPARISON:  PET CT yesterday. Radiograph 06/25/2019. Chest CT 06/15/2019 FINDINGS: Left pleuroparenchymal opacity corresponds to lung mass and pleural effusion on PET CT yesterday. Emphysema. Bulla in the right lower lung zone with adjacent nodular consolidation again seen. Left hilar calcified nodes. No pneumothorax. Heart is normal in size. Pancreatic calcifications in the upper abdomen. IMPRESSION: 1. Left pleuroparenchymal opacity corresponds to lung mass and pleural effusion, characterized on PET CT yesterday. 2. Emphysema.  No acute findings by radiograph. Electronically Signed   By: Keith Rake M.D.   On: 07/08/2019 03:16   CT HEAD CODE STROKE WO CONTRAST  Result Date: 07/08/2019 CLINICAL DATA:  Code stroke. Initial evaluation for acute altered mental status, right-sided weakness. EXAM: CT HEAD WITHOUT CONTRAST TECHNIQUE: Contiguous axial images were obtained from the base of the skull through the vertex without intravenous contrast. COMPARISON:  None. FINDINGS: Brain: Generalized age-related cerebral atrophy with moderate chronic microischemic disease. No acute intracranial hemorrhage. There is question of a focal hypodensity involving the right paramedian pons, difficult to be certain as this area is prone artifact on CT. No other acute large vessel territory infarct. No mass lesion, midline shift or mass effect. No hydrocephalus or extra-axial fluid collection. Vascular: No hyperdense vessel. Calcified atherosclerosis present at skull  base. Skull: Scalp soft tissues and calvarium within normal limits. Sinuses/Orbits: Globes orbital soft tissues demonstrate no acute finding. Paranasal sinuses are largely clear. Small chronic appearing right mastoid effusion noted. Left mastoid air cells are clear. Other: None. ASPECTS [facility] Stroke Program Early CT Score) - Ganglionic level infarction (caudate, lentiform nuclei, internal capsule, insula, M1-M3 cortex): 7 - Supraganglionic infarction (M4-M6 cortex): 3 Total score (0-10 with 10 being normal): 10 IMPRESSION: 1. Question a focal hypodensity involving the right paramedian pons, which could reflect an evolving acute ischemic infarct. Difficult to be certain of this finding as this area is prone to artifact on CT. No intracranial hemorrhage. 2. ASPECTS is 10. 3. Age-related cerebral atrophy with chronic microvascular ischemic disease. These results were communicated to Dr. Cheral Marker at 2:25 [place] 4/22/2021by text page via the [facility] messaging system. Electronically Signed   By: Jeannine Boga M.D.   On: 07/08/2019 02:30    Assessment/Plan  Altered mental status -Found by family at home confused and minimally responsive.  She was brought in due to concern for stroke given facial droop and weakness as well.  Code stroke ruled out on presentation in the ER after neurology evaluation.  Brain MRI is however recommended.  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  Overall prognosis appears poor given her extremely frail state, rapid physical decline, and overall poor reserve -Would consider discussion with  palliative care/hospice if patient and family amenable  Leukocytosis -Differential includes reactive in nature due to events of the morning when patient down versus underlying infection.  She does not appear toxic and has no other systemic signs of infection -Check procalcitonin algorithm and hold off on antibiotics for now unless spikes fever or becomes more toxic appearing  CKD 3/4 -Baseline appears to  be around 1.8 to 2.  She is currently at baseline -Avoid nephrotoxic agents -Hold off on fluids but encourage oral intake  DMII - check A1c - continue SSI and CBG monitoring   Hypothyroidism - await med rec, then resume synthroid   DVT prophylaxis: enoxaparin (Lovenox) 40mg  SQ 2 hours prior to surgery then every day Code Status: Full code Family Communication: none Disposition Plan: home Consults called: neurology Admission status: inpatient   Dwyane Dee, MD [facility] Pager: Secure chat via [place] pager # : [phone]  If 7PM-7AM, please contact night-coverage [email or web address] Use universal [place] password for that web site. If you do not have the password, please call the hospital operator.  07/08/2019, 7:01 AM

## 2019-07-08 NOTE — Progress Notes (Signed)
EEG complete - results pending 

## 2019-07-08 NOTE — Consult Note (Addendum)
NEURO HOSPITALIST CONSULT NOTE   Requestig physician: Dr. Thereasa Solo  Reason for Consult: Acute onset of right sided weakness  History obtained from:  EMS and Chart     HPI:                                                                                                                                          Caitlyn Marshall is an 79 y.o. female with a PMHx of DM, chronic kidney disease, dementia, cancer, gout, HLD, HTN, hypothyrodism who presents as a Code Stroke via EMS for new onset right facial droop, right arm weakness and AMS. LKN was 2345 when she went to bed. Later, her daughter was about to get into bed with her when she noticed that the patient was "snoring funny". She tried to wake her mother up, but she was unarousable. EMS was called and on arrival the patient exhibited the deficits noted above. CBG was 137, BP 185/65, sats 97% on RA. No seizure activity was noticed by family or EMS. On arrival to the ED, she had nausea with vomiting.   Her home medications include ASA and donepezil.   CT head in the ED shows no ICH. There is equivocal hypodensity within the right pons, possibly representing an infarction of indeterminate age.   LKN 2345 NIHSS: 1 tPA administered: No. Low NIHSS and other components of the DDx for her presentation that are more likely, including sepsis  Of note, PET scan obtained yesterday revealed finding most consistent with lung cancer: 1. Hypermetabolic mass in the left chest with contralateral mediastinal adenopathy showing hypermetabolic features, suspicious for primary bronchogenic neoplasm. 2. Lesion distorts the fissure and is inseparable from the left heart border. 3. Left effusion without discrete marked hypermetabolic features. Collapsed lung along the anterior margin displaying slightly greater than blood pool activity. 4. Mild increased metabolism in right hilar lymph nodes and a focus of nodularity along bullous changes in  the right lower lobe. Finding may represent a second primary or metastatic disease, increased in size since the study of May 01, 2017. Currently approximately 2 x 1.9 cm, area of concern along the anterolateral margin of this cystic and or bullous area previously measuring approximately 15 x 11 mm in 2019. 5. Areas of activity not registering well with the renal profile on CT images likely associated with pooling of FDG within excreted urine in areas of patulous collecting systems or in dependent collecting systems. Dedicated renal imaging may be helpful for further assessment, ultrasound could be considered given small size of the patient as renal function may not allow for administration of intravenous contrast. 6. Changes of chronic calcific pancreatitis.  Past Medical History:  Diagnosis Date  . Chronic kidney disease   . Diabetes mellitus without complication ([place])   .  Gout   . Hyperlipidemia   . Hypertension   . Hypothyroidism   . Thyroid disease     Past Surgical History:  Procedure Laterality Date  . CATARACT EXTRACTION W/PHACO Left 05/26/2018   Procedure: CATARACT EXTRACTION PHACO AND INTRAOCULAR LENS PLACEMENT ([place])  LEFT DIABETIC;  Surgeon: Leandrew Koyanagi, MD;  Location: [facility];  Service: Ophthalmology;  Laterality: Left;  Diabetic - insulin  . TUBAL LIGATION    . Vascular Stent in right leg      Family History  Problem Relation Age of Onset  . Hypertension Mother   . Diabetes Mother        Diabetes mellitus Type 2  . Alzheimer's disease Mother   . Cancer Son        died at age 51, esophagus             Social History:  reports that she quit smoking about 12 years ago. Her smoking use included cigarettes. She has a 15.00 pack-year smoking history. She has never used smokeless tobacco. She reports that she does not drink alcohol or use drugs.  No Known Allergies  HOME MEDICATIONS:                                                                                                                       No current facility-administered medications on file prior to encounter.   Current Outpatient Medications on File Prior to Encounter  Medication Sig Dispense Refill  . allopurinol (ZYLOPRIM) 100 MG tablet TAKE 2 TABLETS(200 MG) BY MOUTH TWICE DAILY 360 tablet 3  . ALPRAZolam (XANAX) 0.25 MG tablet Take 1 tablet (0.25 mg total) by mouth every 6 (six) hours as needed. 30 tablet 1  . amLODipine (NORVASC) 10 MG tablet Take 10 mg by mouth daily.    Marland Kitchen aspirin EC 81 MG tablet Take 81 mg by mouth daily.     Marland Kitchen azithromycin (ZITHROMAX) 250 MG tablet Take 2 tablets PO on day one, and one tablet PO daily thereafter until completed. 6 tablet 0  . cyproheptadine (PERIACTIN) 4 MG tablet Start 1/2 tablet three times daily for one week, then increase to 1 tablet three times daily (Patient taking differently: Take 4 mg by mouth 3 (three) times daily. Start 1/2 tablet three times daily for one week, then increase to 1 tablet three times daily) 90 tablet 1  . donepezil (ARICEPT) 10 MG tablet Take 1 tablet (10 mg total) by mouth at bedtime. 30 tablet 5  . hydrochlorothiazide (HYDRODIURIL) 12.5 MG tablet TK 1 T PO QAM UTD FOR LEG SWELLING  11  . insulin NPH Human (NOVOLIN N) 100 UNIT/ML injection Inject 6-7 Units into the skin 2 (two) times daily before a meal.     . insulin regular (NOVOLIN R,HUMULIN R) 100 units/mL injection Inject 5-9 Units into the skin 3 (three) times daily before meals. 6 units in AM, 8 units at lunch and 10 units in PM    . Insulin Syringe-Needle U-100 (  INSULIN SYRINGE .5CC/30GX5/16") 30G X 5/16" 0.5 ML MISC U QID UTD  5  . irbesartan (AVAPRO) 300 MG tablet Take 300 mg by mouth every evening.  8  . levothyroxine (SYNTHROID, LEVOTHROID) 50 MCG tablet Take 1 tablet by mouth daily.  5  . lovastatin (MEVACOR) 20 MG tablet TAKE 1 TABLET BY MOUTH DAILY 90 tablet 3  . megestrol (MEGACE) 20 MG tablet Take 1 tablet (20 mg total) by mouth daily.  30 tablet 5  . montelukast (SINGULAIR) 10 MG tablet TAKE 1 TABLET BY MOUTH EVERY DAY 90 tablet 4  . ondansetron (ZOFRAN) 4 MG tablet Take 1 tablet by mouth every 8 (eight) hours as needed. For nausea    . ONE TOUCH ULTRA TEST test strip TEST THREE TIMES DAILY 100 each 5  . ONETOUCH DELICA LANCETS 27O MISC INJECT AS DIRECTED THREE TIMES DAILY    . potassium chloride SA (K-DUR,KLOR-CON) 20 MEQ tablet Take 1 tablet by mouth daily.  2  . torsemide (DEMADEX) 20 MG tablet Take 20 mg by mouth daily.       ROS:                                                                                                                                       Positive for nausea and vomiting. Unable to obtain more detailed ROS due to patient's dementia.   There were no vitals taken for this visit.   General Examination:                                                                                                       Physical Exam  General: Diffusely cachectic HEENT-  Normocephalic Lungs- Intermittent coughing Extremities- No edema  Neurological Examination Mental Status: Awake with decreased level of alertness. Appears fatigued. Speech hypophonic and sparse, but without definite dysarthria or dysphasia. Poorly oriented.  Cranial Nerves: II: Visual fields intact with no extinction to DSS. PERRL III,IV, VI: EOMI without nystagmus.  V,VII: No facial droop in the context of being partially edentulous on the left. Temp sensation equal bilaterally  VIII: hearing intact to voice IX,X: Hypophonic speech.  XI: Symmetric XII: midline tongue extension Motor: BUE 4/5 proximally and distally in the context of cachexia and decreased muscle bulk BLE: Can only elevate antigravity at hips with knees bent due to weakness of hip flexors. Knee extensors 4-/5 on the right and 4/5 on the left. Knee flexors 4-/5. ADF/APF 4/5. Findings in context of cachexia with decreased  muscle bulk diffusely.  No pronator drift.   Sensory: Temp sensation intact x 4. FT sensation intact x 4. No extinction.  Deep Tendon Reflexes: 2+ bilateral upper and lower extremities.  Cerebellar: No ataxia but slow with FNF bilaterally  Gait: Unable to assess   Lab Results: Basic Metabolic Panel: Recent Labs  Lab 07/08/19 0206  NA 140  K 3.8  CL 105  GLUCOSE 149*  BUN 23  CREATININE 1.60*    CBC: Recent Labs  Lab 07/08/19 0206  HGB 12.6  HCT 37.0    Cardiac Enzymes: No results for input(s): CKTOTAL, CKMB, CKMBINDEX, TROPONINI in the last 168 hours.  Lipid Panel: No results for input(s): CHOL, TRIG, HDL, CHOLHDL, VLDL, LDLCALC in the last 168 hours.  Imaging: NM PET Image Initial (PI) Skull Base To Thigh  Result Date: 07/07/2019 CLINICAL DATA:  Initial treatment strategy for lung mass. EXAM: NUCLEAR MEDICINE PET SKULL BASE TO THIGH TECHNIQUE: 5.6 mCi F-18 FDG was injected intravenously. Full-ring PET imaging was performed from the skull base to thigh after the radiotracer. CT data was obtained and used for attenuation correction and anatomic localization. Fasting blood glucose: 139 mg/dl COMPARISON:  Prior chest CTs most recent with the chest abdomen and pelvis study of June 15, 2019 FINDINGS: Mediastinal blood pool activity: SUV max 1.84 Liver activity: SUV max NA NECK: No hypermetabolic lymph nodes in the neck. Incidental CT findings: none CHEST: Left juxta hilar mass in the lingula distorting the fissure and inseparable from the left cardiac border and extending from the hilum into the left chest measuring approximately 4.8 x 3.8 cm (SUVmax = 7.4) quite similar to its appearance on the diagnostic CT obtained on 06/15/2019 Associated with hypermetabolic right paratracheal, precarinal, subcarinal and left hilar adenopathyw (Image 94, series 3): Hypermetabolic subcarinal lymph node 8 mm short axis (SUVmax = 6.0) Precarinal adenopathy similar size (SUVmax = 5.0) (image 86, series 3 Mildly hypermetabolic high right  paratracheal lymph node (image 75, series 3) (SUVmax = 3.2 Left-sided pleural effusion with mild hypermetabolic activity at the periphery most areas corresponding to collapsed lung with metabolic activity slightly greater than blood pool. Mildly increased metabolic activity about the right hilum (image 102, series 3) (SUVmax = 2.8) 7 mm lymph node in this location. Background marked pulmonary emphysema with bullous changes in the right lower lobe. Along the margin of 1 of these areas of bullous disease is an area of nodularity that is unchanged in the short interval measuring approximately 2 x 1.9 cm (image 110, series 3) (SUVmax = 2.3) Incidental CT findings: calcified atheromatous plaque throughout the thoracic aorta. Mass abutting the left cardiac border. Small pericardial effusion. No chest wall lesion. No new finding since the previous CT study. Airways are patent. Left pleural effusion with similar volume to slightly diminished volume. ABDOMEN/PELVIS: Asymmetric renal activity with upper pole activity that extend slightly beyond the contour of the left kidney but without CT correlate. A similar appearance in the upper pole the right kidney where there is cortical scarring. No hypermetabolic areas in other solid organs. Changes of chronic calcific pancreatitis. No abdominal adenopathy. Crowding of bowel loops in the abdomen due to cachexia and limited intra-abdominal fat limiting assessment. Incidental CT findings: Low-density lesions in the liver likely small cysts. Limited visualization of the adrenal glands which are mildly nodular but not associated with increased FDG uptake beyond liver or blood pool. Numerous bilateral intrarenal calculi and likely parenchymal calculi in the kidneys. No hydronephrosis. No visible renal lesion. Limited  assessment of bowel loops due to lack of mesenteric and intra-abdominal fat with similar appearance of visualized bowel accounting for lack of contrast on today's study is  compared to the study of 06/15/2019. SKELETON: No focal hypermetabolic activity to suggest skeletal metastasis. Incidental CT findings: Spinal degenerative changes. IMPRESSION: 1. Hypermetabolic mass in the left chest with contralateral mediastinal adenopathy showing hypermetabolic features, suspicious for primary bronchogenic neoplasm. 2. Lesion distorts the fissure and is inseparable from the left heart border. 3. Left effusion without discrete marked hypermetabolic features. Collapsed lung along the anterior margin displaying slightly greater than blood pool activity. 4. Mild increased metabolism in right hilar lymph nodes and a focus of nodularity along bullous changes in the right lower lobe. Finding may represent a second primary or metastatic disease, increased in size since the study of May 01, 2017. Currently approximately 2 x 1.9 cm, area of concern along the anterolateral margin of this cystic and or bullous area previously measuring approximately 15 x 11 mm in 2019. 5. Areas of activity not registering well with the renal profile on CT images likely associated with pooling of FDG within excreted urine in areas of patulous collecting systems or in dependent collecting systems. Dedicated renal imaging may be helpful for further assessment, ultrasound could be considered given small size of the patient as renal function may not allow for administration of intravenous contrast. 6. Changes of chronic calcific pancreatitis. Electronically Signed   By: Zetta Bills M.D.   On: 07/07/2019 16:12    Assessment: 79 y.o. female with a PMHx of DM, chronic kidney disease, dementia, cancer, gout, HLD, HTN, hypothyrodism who presents as a Code Stroke via EMS for new onset right facial droop, right arm weakness and AMS. On arrival to the ED, she had nausea with vomiting.  1. Exam reveals a cachectic patient with diffuse weakness but no lateralized motor deficit.  2. Her home medications include ASA and  donepezil.  3. CT head in the ED shows no ICH. There is equivocal hypodensity within the right pons, possibly representing an infarction of indeterminate age.  4. IV tPA not indicated due to low NIHSS and other components of the DDx for her presentation that are more likely, including sepsis 5. TIA is also on the DDx.   Recommendations: 1. MRI brain and MRA head 2. Evaluation and management of possible sepsis 3. Hematology/Oncology consult  Electronically signed: Dr. Kerney Elbe 07/08/2019, 2:23 AM

## 2019-07-08 NOTE — ED Notes (Signed)
Attempted report 

## 2019-07-08 NOTE — Progress Notes (Signed)
NEW ADMISSION NOTE New Admission Note:   Arrival Method: patient arrived from ED in bed. Mental Orientation: Alert to self Telemetry: 63M-09, NSR Assessment: Completed Skin: Dry and intact. IV:  Left forearm, SL Pain: Denies any pain. Tubes: None. Safety Measures: Safety Fall Prevention Plan has been given, discussed and signed Admission: Completed [facility] Orientation: Patient has been orientated to the room, unit and staff.  Family: None.  Orders have been reviewed and implemented. Will continue to monitor the patient. Call light has been placed within reach and bed alarm has been activated.   Amaryllis Dyke, RN

## 2019-07-08 NOTE — ED Triage Notes (Signed)
Pt to ED from home via [facility], called out for code stroke, LSN 1145pm, went to bed, daughter heard a loud gurgling, pt then was unresponsive for a short while, became altered, had a R sided facial droop and R arm weakness that resolved en route. Pt c/o of occasional SOB and became vomiting thick secretions upon arrival to the ED.

## 2019-07-08 NOTE — Progress Notes (Signed)
Caitlyn Marshall TEAM 1 - Stepdown/ICU TEAM  VARNELL DONATE  ZOX:[id number] DOB: Sep 01, 1940 DOA: 07/08/2019 PCP: Birdie Sons, MD    Brief Narrative:  79 year old with a history of hypothyroidism, HTN, HLD, DM 2, and chronic kidney disease stage III-IV who was brought to the ED after her daughter found her gurgling and unresponsive in her bed.  She had a right-sided facial droop and apparent right arm weakness.  History also revealed apparent shortness of breath and vomiting at home.  In the ED the patient herself is altered and unable to provide a reliable history though she was awake.  At initial presentation the patient was a code stroke but this was canceled after neurology evaluated the patient.  Most of her focal neurologic symptoms had resolved at the time of arrival in the ED.  CT of the head revealed a questionable hypodensity in the right paramedian pons suggestive of ischemia.  Of note the patient was undergoing an outpatient work-up for a lung mass accompanied by unintentional weight loss and poor appetite.  She had a PET scan 4/21 which was notable for a left-sided hypermetabolic mass suggestive of a malignancy, with a similar focus in the right lung.  Significant Events: 4/22 admit via [facility] w/ AMS   Subjective: Pt is seen for a f/u visit.    Assessment & Plan:  Altered mental status -unclear etiology CT head abnormal but not definitive -MRI/A w/o acute findings -neurology has evaluated  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 [name] oncology - based upon prior plan patient will need biopsy given positive PET scan results  Leukocytosis Likely simple demargination/reactive -no evidence of focal infection at present  Chronic kidney disease stage III-IV Baseline creatinine 1.8-2  DM 2  Hypothyroidism  DVT prophylaxis: Lovenox Code Status: FULL CODE Family Communication:  Disposition Plan:   Consultants:   Neurology  Antimicrobials:  None  Objective: Blood pressure (!) 139/56, pulse 73, temperature 97.6 F (36.4 C), temperature source Oral, resp. rate 17, SpO2 96 %. No intake or output data in the 24 hours ending 07/08/19 0804 There were no vitals filed for this visit.  Examination: Pt was seen for a f/u visit.    CBC: Recent Labs  Lab 07/08/19 0200 07/08/19 0206  WBC 14.3*  --   NEUTROABS 9.9*  --   HGB 11.6* 12.6  HCT 37.6 37.0  MCV 80.0  --   PLT 198  --    Basic Metabolic Panel: Recent Labs  Lab 07/08/19 0200 07/08/19 0206  NA 139 140  K 4.2 3.8  CL 105 105  CO2 21*  --   GLUCOSE 145* 149*  BUN 22 23  CREATININE 1.78* 1.60*  CALCIUM 9.0  --    GFR: CrCl cannot be calculated (Unknown ideal weight.).  Liver Function Tests: Recent Labs  Lab 07/08/19 0200  AST 17  ALT 8  ALKPHOS 76  BILITOT 0.9  PROT 6.1*  ALBUMIN 2.9*   No results for input(s): LIPASE, AMYLASE in the last 168 hours. No results for input(s): AMMONIA in the last 168 hours.  Coagulation Profile: Recent Labs  Lab 07/08/19 0200  INR 1.1    Cardiac Enzymes: No results for input(s): CKTOTAL, CKMB, CKMBINDEX, TROPONINI in the last 168 hours.  HbA1C: Hemoglobin A1C  Date/Time Value Ref Range Status  08/05/2018 12:00 AM 7.7  Final  12/25/2016 12:00 AM 8.1  Final   Hgb A1c MFr Bld  Date/Time Value Ref Range Status  05/31/2019 10:59 AM 7.2 (H) 4.8 - 5.6 % Final    Comment:             Prediabetes: 5.7 - 6.4          Diabetes: >6.4          Glycemic control for adults with diabetes: <7.0     CBG: Recent Labs  Lab 07/07/19 0921  GLUCAP 139*    Recent Results (from the past 240 hour(s))  Respiratory Panel by RT PCR (Flu A&B, Covid) - Nasopharyngeal Swab     Status: None   Collection Time: 07/08/19  3:13 AM   Specimen: Nasopharyngeal Swab  Result Value Ref Range Status   SARS Coronavirus 2 by RT PCR NEGATIVE NEGATIVE Final    Comment: (NOTE) SARS-CoV-2 target nucleic  acids are NOT DETECTED. The SARS-CoV-2 RNA is generally detectable in upper respiratoy specimens during the acute phase of infection. The lowest concentration of SARS-CoV-2 viral copies this assay can detect is 131 copies/mL. A negative result does not preclude SARS-Cov-2 infection and should not be used as the sole basis for treatment or other patient management decisions. A negative result may occur with  improper specimen collection/handling, submission of specimen other than nasopharyngeal swab, presence of viral mutation(s) within the areas targeted by this assay, and inadequate number of viral copies (<131 copies/mL). A negative result must be combined with clinical observations, patient history, and epidemiological information. The expected result is Negative. Fact Sheet for Patients:  PinkCheek.be Fact Sheet for Healthcare Providers:  GravelBags.it This test is not yet ap proved or cleared by the [place] FDA and  has been authorized for detection and/or diagnosis of SARS-CoV-2 by FDA under an Emergency Use Authorization (EUA). This EUA will remain  in effect (meaning this test can be used) for the duration of the COVID-19 declaration under Section 564(b)(1) of the Act, 21 U.S.C. section 360bbb-3(b)(1), unless the authorization is terminated or revoked sooner.    Influenza A by PCR NEGATIVE NEGATIVE Final   Influenza B by PCR NEGATIVE NEGATIVE Final    Comment: (NOTE) The Xpert Xpress SARS-CoV-2/FLU/RSV assay is intended as an aid in  the diagnosis of influenza from Nasopharyngeal swab specimens and  should not be used as a sole basis for treatment. Nasal washings and  aspirates are unacceptable for Xpert Xpress SARS-CoV-2/FLU/RSV  testing. Fact Sheet for Patients: PinkCheek.be Fact Sheet for Healthcare Providers: GravelBags.it This test is not yet  approved or cleared by the [place] FDA and  has been authorized for detection and/or diagnosis of SARS-CoV-2 by  FDA under an Emergency Use Authorization (EUA). This EUA will remain  in effect (meaning this test can be used) for the duration of the  Covid-19 declaration under Section 564(b)(1) of the Act, 21  U.S.C. section 360bbb-3(b)(1), unless the authorization is  terminated or revoked. Performed at [facility], [place] [address], [place], [place] [address]      Scheduled Meds: Continuous Infusions:   LOS: 0 days   Time spent: No Charge  Cherene Altes, MD [facility] Office  [phone] Pager - Text Page per Amion as per below:  On-Call/Text Page:      [email or web address]  If 7PM-7AM, please contact night-coverage [email or web address] 07/08/2019, 8:04 AM

## 2019-07-08 NOTE — ED Provider Notes (Signed)
[facility] EMERGENCY DEPARTMENT Provider Note   CSN: [id number] Arrival date & time: 07/08/19  0154  An emergency department physician performed an initial assessment on this suspected stroke patient at 0158.  History Chief Complaint  Patient presents with  . Altered Mental Status    Caitlyn Marshall is a 79 y.o. female.  The history is provided by the patient and medical records.  Altered Mental Status Associated symptoms: weakness     79 year old female with history of chronic kidney disease, diabetes, gout, hyperlipidemia, hypertension, hypothyroidism, presenting to the ED as a code stroke.  Last known well 2345.  Patient's daughter reports she was going to climb into bed with her and noticed some abnormal snoring sounds.  States she woke her up and she seemed to be altered, not acting like herself, and weak on her right side.  EMS was called and code stroke activation at 0125.  Patient had emesis x1 upon arrival to ED but is protecting airway.  Denies fever/chills/sweats.  She is being worked-up for lung nodule by PCP and under surveillance for weight loss/malnutrition.  Past Medical History:  Diagnosis Date  . Chronic kidney disease   . Diabetes mellitus without complication ([place])   . Gout   . Hyperlipidemia   . Hypertension   . Hypothyroidism   . Thyroid disease     Patient Active Problem List   Diagnosis Date Noted  . Diabetic retinopathy ([place]) 02/23/2018  . Nodule of lower lobe of left lung 04/17/2017  . Insomnia 07/12/2015  . Hypertension 07/12/2015  . Poor appetite 07/12/2015  . Constipation 07/12/2015  . Bursitis 05/31/2015  . 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 Leg swelling 05/31/2015  . Allergic rhinitis 12/26/2014  . Vitamin D deficiency 12/20/2014  . Anxiety 12/20/2014  . Hypercholesterolemia 12/20/2014  . Anemia 12/20/2014  . Hypothyroidism 12/20/2014  . Hypertensive CKD (chronic  kidney disease) 12/20/2014  . Premature ventricular contraction 12/20/2014  . PVD (peripheral vascular disease) ([place]) 12/20/2014  . Gout 12/20/2014  . Osteoarthritis 12/20/2014  . Renal insufficiency 12/20/2014  . Chronic kidney disease (CKD), stage III (moderate) 12/20/2014    Past Surgical History:  Procedure Laterality Date  . CATARACT EXTRACTION W/PHACO Left 05/26/2018   Procedure: CATARACT EXTRACTION PHACO AND INTRAOCULAR LENS PLACEMENT ([place])  LEFT DIABETIC;  Surgeon: Leandrew Koyanagi, MD;  Location: [facility];  Service: Ophthalmology;  Laterality: Left;  Diabetic - insulin  . TUBAL LIGATION    . Vascular Stent in right leg       OB History    Gravida  4   Para  4   Term      Preterm      AB      Living        SAB      TAB      Ectopic      Multiple      Live Births              Family History  Problem Relation Age of Onset  . Hypertension Mother   . Diabetes Mother        Diabetes mellitus Type 2  . Alzheimer's disease Mother   . Cancer Son        died at age 60, esophagus    Social History   Tobacco Use  . Smoking status: Former Smoker    Packs/day: 0.50    Years: 30.00  Pack years: 15.00    Types: Cigarettes    Quit date: 03/18/2007    Years since quitting: 12.3  . Smokeless tobacco: Never Used  . Tobacco comment: Quit smoking in 2008  Substance Use Topics  . Alcohol use: No    Alcohol/week: 0.0 standard drinks  . Drug use: No    Home Medications Prior to Admission medications   Medication Sig Start Date End Date Taking? Authorizing Provider  allopurinol (ZYLOPRIM) 100 MG tablet TAKE 2 TABLETS(200 MG) BY MOUTH TWICE DAILY 11/13/18   Birdie Sons, MD  ALPRAZolam Duanne Moron) 0.25 MG tablet Take 1 tablet (0.25 mg total) by mouth every 6 (six) hours as needed. 06/14/19   Birdie Sons, MD  amLODipine (NORVASC) 10 MG tablet Take 10 mg by mouth daily. 12/21/14   [provider]  aspirin EC 81 MG tablet Take 81  mg by mouth daily.     [provider]  azithromycin (ZITHROMAX) 250 MG tablet Take 2 tablets PO on day one, and one tablet PO daily thereafter until completed. 06/28/19   Mar Daring, PA-C  cyproheptadine (PERIACTIN) 4 MG tablet Start 1/2 tablet three times daily for one week, then increase to 1 tablet three times daily Patient taking differently: Take 4 mg by mouth 3 (three) times daily. Start 1/2 tablet three times daily for one week, then increase to 1 tablet three times daily 09/07/18   Birdie Sons, MD  donepezil (ARICEPT) 10 MG tablet Take 1 tablet (10 mg total) by mouth at bedtime. 07/07/19   Birdie Sons, MD  hydrochlorothiazide (HYDRODIURIL) 12.5 MG tablet TK 1 T PO QAM UTD FOR LEG SWELLING 11/16/14   [provider]  insulin NPH Human (NOVOLIN N) 100 UNIT/ML injection Inject 6-7 Units into the skin 2 (two) times daily before a meal.     [provider]  insulin regular (NOVOLIN R,HUMULIN R) 100 units/mL injection Inject 5-9 Units into the skin 3 (three) times daily before meals. 6 units in AM, 8 units at lunch and 10 units in PM    [provider]  Insulin Syringe-Needle U-100 (INSULIN SYRINGE .5CC/30GX5/16") 30G X 5/16" 0.5 ML MISC U QID UTD 09/13/14   [provider]  irbesartan (AVAPRO) 300 MG tablet Take 300 mg by mouth every evening. 01/02/17   [provider]  levothyroxine (SYNTHROID, LEVOTHROID) 50 MCG tablet Take 1 tablet by mouth daily. 10/21/14   [provider]  lovastatin (MEVACOR) 20 MG tablet TAKE 1 TABLET BY MOUTH DAILY 03/01/19   Birdie Sons, MD  megestrol (MEGACE) 20 MG tablet Take 1 tablet (20 mg total) by mouth daily. 05/31/19   Birdie Sons, MD  montelukast (SINGULAIR) 10 MG tablet TAKE 1 TABLET BY MOUTH EVERY DAY 05/29/19   Birdie Sons, MD  ondansetron (ZOFRAN) 4 MG tablet Take 1 tablet by mouth every 8 (eight) hours as needed. For nausea 08/12/18   [provider]  ONE TOUCH  ULTRA TEST test strip TEST THREE TIMES DAILY 09/06/14   Jerrol Banana., MD  [facility] DELICA LANCETS 67T MISC INJECT AS DIRECTED THREE TIMES DAILY 11/30/14   [provider]  potassium chloride SA (K-DUR,KLOR-CON) 20 MEQ tablet Take 1 tablet by mouth daily. 09/16/17   [provider]  torsemide (DEMADEX) 20 MG tablet Take 20 mg by mouth daily.  10/08/18   [provider]    Allergies    Patient has no known allergies.  Review of  Systems   Review of Systems  Neurological: Positive for weakness.  All other systems reviewed and are negative.   Physical Exam Updated Vital Signs BP (!) 153/56   Pulse 89   Temp 97.6 F (36.4 C) (Oral)   Resp (!) 23   SpO2 94%   Physical Exam Vitals and nursing note reviewed.  Constitutional:      Appearance: She is well-developed.     Comments: Thin, cachectic  HENT:     Head: Normocephalic and atraumatic.     Mouth/Throat:     Comments: Poor dentition, multiple teeth absent Eyes:     Conjunctiva/sclera: Conjunctivae normal.     Pupils: Pupils are equal, round, and reactive to light.  Cardiovascular:     Rate and Rhythm: Normal rate and regular rhythm.     Heart sounds: Normal heart sounds.  Pulmonary:     Effort: Pulmonary effort is normal.     Breath sounds: Rhonchi present.     Comments: Rhonchi throughout, wet cough, clear/white phlegm noted Abdominal:     General: Bowel sounds are normal.     Palpations: Abdomen is soft.  Musculoskeletal:        General: Normal range of motion.     Cervical back: Normal range of motion.  Skin:    General: Skin is warm and dry.  Neurological:     Mental Status: She is alert.     Comments: Awake, alert, oriented to self only; exam is largely inconsistent, initially with some weakness in the right leg but after large amount of instruction/assistance she was able to perform tasks, no focal strength/sensory deficit appreciated, speech sounds normal given her lack of  dentition     ED Results / Procedures / Treatments   Labs (all labs ordered are listed, but only abnormal results are displayed) Labs Reviewed  CBC - Abnormal; Notable for the following components:      Result Value   WBC 14.3 (*)    Hemoglobin 11.6 (*)    MCH 24.7 (*)    RDW 21.2 (*)    All other components within normal limits  DIFFERENTIAL - Abnormal; Notable for the following components:   Neutro Abs 9.9 (*)    Monocytes Absolute 1.3 (*)    Abs Immature Granulocytes 0.10 (*)    All other components within normal limits  COMPREHENSIVE METABOLIC PANEL - Abnormal; Notable for the following components:   CO2 21 (*)    Glucose, Bld 145 (*)    Creatinine, Ser 1.78 (*)    Total Protein 6.1 (*)    Albumin 2.9 (*)    GFR calc non Af Amer 27 (*)    GFR calc Af Amer 31 (*)    All other components within normal limits  I-STAT CHEM 8, ED - Abnormal; Notable for the following components:   Creatinine, Ser 1.60 (*)    Glucose, Bld 149 (*)    All other components within normal limits  RESPIRATORY PANEL BY RT PCR (FLU A&B, COVID)  ETHANOL  PROTIME-INR  APTT  RAPID URINE DRUG SCREEN, HOSP PERFORMED  URINALYSIS, ROUTINE W REFLEX MICROSCOPIC    EKG None  Radiology NM PET Image Initial (PI) Skull Base To Thigh  Result Date: 07/07/2019 CLINICAL DATA:  Initial treatment strategy for lung mass. EXAM: NUCLEAR MEDICINE PET SKULL BASE TO THIGH TECHNIQUE: 5.6 mCi F-18 FDG was injected intravenously. Full-ring PET imaging was performed from the skull base to thigh after the radiotracer. CT data was obtained and  used for attenuation correction and anatomic localization. Fasting blood glucose: 139 mg/dl COMPARISON:  Prior chest CTs most recent with the chest abdomen and pelvis study of June 15, 2019 FINDINGS: Mediastinal blood pool activity: SUV max 1.84 Liver activity: SUV max NA NECK: No hypermetabolic lymph nodes in the neck. Incidental CT findings: none CHEST: Left juxta hilar mass in the  lingula distorting the fissure and inseparable from the left cardiac border and extending from the hilum into the left chest measuring approximately 4.8 x 3.8 cm (SUVmax = 7.4) quite similar to its appearance on the diagnostic CT obtained on 06/15/2019 Associated with hypermetabolic right paratracheal, precarinal, subcarinal and left hilar adenopathyw (Image 94, series 3): Hypermetabolic subcarinal lymph node 8 mm short axis (SUVmax = 6.0) Precarinal adenopathy similar size (SUVmax = 5.0) (image 86, series 3 Mildly hypermetabolic high right paratracheal lymph node (image 75, series 3) (SUVmax = 3.2 Left-sided pleural effusion with mild hypermetabolic activity at the periphery most areas corresponding to collapsed lung with metabolic activity slightly greater than blood pool. Mildly increased metabolic activity about the right hilum (image 102, series 3) (SUVmax = 2.8) 7 mm lymph node in this location. Background marked pulmonary emphysema with bullous changes in the right lower lobe. Along the margin of 1 of these areas of bullous disease is an area of nodularity that is unchanged in the short interval measuring approximately 2 x 1.9 cm (image 110, series 3) (SUVmax = 2.3) Incidental CT findings: calcified atheromatous plaque throughout the thoracic aorta. Mass abutting the left cardiac border. Small pericardial effusion. No chest wall lesion. No new finding since the previous CT study. Airways are patent. Left pleural effusion with similar volume to slightly diminished volume. ABDOMEN/PELVIS: Asymmetric renal activity with upper pole activity that extend slightly beyond the contour of the left kidney but without CT correlate. A similar appearance in the upper pole the right kidney where there is cortical scarring. No hypermetabolic areas in other solid organs. Changes of chronic calcific pancreatitis. No abdominal adenopathy. Crowding of bowel loops in the abdomen due to cachexia and limited intra-abdominal fat  limiting assessment. Incidental CT findings: Low-density lesions in the liver likely small cysts. Limited visualization of the adrenal glands which are mildly nodular but not associated with increased FDG uptake beyond liver or blood pool. Numerous bilateral intrarenal calculi and likely parenchymal calculi in the kidneys. No hydronephrosis. No visible renal lesion. Limited assessment of bowel loops due to lack of mesenteric and intra-abdominal fat with similar appearance of visualized bowel accounting for lack of contrast on today's study is compared to the study of 06/15/2019. SKELETON: No focal hypermetabolic activity to suggest skeletal metastasis. Incidental CT findings: Spinal degenerative changes. IMPRESSION: 1. Hypermetabolic mass in the left chest with contralateral mediastinal adenopathy showing hypermetabolic features, suspicious for primary bronchogenic neoplasm. 2. Lesion distorts the fissure and is inseparable from the left heart border. 3. Left effusion without discrete marked hypermetabolic features. Collapsed lung along the anterior margin displaying slightly greater than blood pool activity. 4. Mild increased metabolism in right hilar lymph nodes and a focus of nodularity along bullous changes in the right lower lobe. Finding may represent a second primary or metastatic disease, increased in size since the study of May 01, 2017. Currently approximately 2 x 1.9 cm, area of concern along the anterolateral margin of this cystic and or bullous area previously measuring approximately 15 x 11 mm in 2019. 5. Areas of activity not registering well with the renal profile on CT images likely  associated with pooling of FDG within excreted urine in areas of patulous collecting systems or in dependent collecting systems. Dedicated renal imaging may be helpful for further assessment, ultrasound could be considered given small size of the patient as renal function may not allow for administration of  intravenous contrast. 6. Changes of chronic calcific pancreatitis. Electronically Signed   By: Zetta Bills M.D.   On: 07/07/2019 16:12   CT HEAD CODE STROKE WO CONTRAST  Result Date: 07/08/2019 CLINICAL DATA:  Code stroke. Initial evaluation for acute altered mental status, right-sided weakness. EXAM: CT HEAD WITHOUT CONTRAST TECHNIQUE: Contiguous axial images were obtained from the base of the skull through the vertex without intravenous contrast. COMPARISON:  None. FINDINGS: Brain: Generalized age-related cerebral atrophy with moderate chronic microischemic disease. No acute intracranial hemorrhage. There is question of a focal hypodensity involving the right paramedian pons, difficult to be certain as this area is prone artifact on CT. No other acute large vessel territory infarct. No mass lesion, midline shift or mass effect. No hydrocephalus or extra-axial fluid collection. Vascular: No hyperdense vessel. Calcified atherosclerosis present at skull base. Skull: Scalp soft tissues and calvarium within normal limits. Sinuses/Orbits: Globes orbital soft tissues demonstrate no acute finding. Paranasal sinuses are largely clear. Small chronic appearing right mastoid effusion noted. Left mastoid air cells are clear. Other: None. ASPECTS [facility] Stroke Program Early CT Score) - Ganglionic level infarction (caudate, lentiform nuclei, internal capsule, insula, M1-M3 cortex): 7 - Supraganglionic infarction (M4-M6 cortex): 3 Total score (0-10 with 10 being normal): 10 IMPRESSION: 1. Question a focal hypodensity involving the right paramedian pons, which could reflect an evolving acute ischemic infarct. Difficult to be certain of this finding as this area is prone to artifact on CT. No intracranial hemorrhage. 2. ASPECTS is 10. 3. Age-related cerebral atrophy with chronic microvascular ischemic disease. These results were communicated to Dr. Cheral Marker at 2:25 [place] 4/22/2021by text page via the [facility] messaging system.  Electronically Signed   By: Jeannine Boga M.D.   On: 07/08/2019 02:30    Procedures Procedures (including critical care time)  Medications Ordered in ED Medications  ondansetron (ZOFRAN) injection 4 mg (4 mg Intravenous Given 07/08/19 0241)  sodium chloride 0.9 % bolus 1,000 mL (1,000 mLs Intravenous New Bag/Given 07/08/19 0310)  metoCLOPramide (REGLAN) injection 5 mg (5 mg Intravenous Given 07/08/19 3614)    ED Course  I have reviewed the triage vital signs and the nursing notes.  Pertinent labs & imaging results that were available during my care of the patient were reviewed by me and considered in my medical decision making (see chart for details).    MDM Rules/Calculators/A&P  79 year old female presenting to the ED as a code stroke.  Last known well 2345 when daughter awoke her due to some abnormal snoring.  Found her to be confused and appeared weak on her right side.  EMS was called and activated as a code stroke.  She did have one episode of emesis on arrival to ED but is maintaining her airway.    Neurologic exam is somewhat inconsistent, she is alert and oriented to self only.  She initially appeared to have some weakness of her right leg, however on repeat testing she appears to have a more generalized weakness.  No appreciable focal deficits.  Initial NIH of 1.  Screening head CT is grossly reassuring, questionable area of the pons that may represent evolving acute stroke.  Per neurology, we will plan for admission with formal stroke work-up including  MRI/ MRA, carotid Dopplers, echo ,etc.  Of note, on chart review it does appear the patient is being followed for left lung nodule.  She had a PET scan yesterday that unfortunately is concerning for malignancy.  It does appear that patient has been generally weak and losing weight for quite some time now based on notes from PCP.  She will likely benefit from a dietary/nutrition consult to help with this as she does appear quite  thin and cachectic today.  CXR today without any superimposed changes, left lung mass again seen.  UA pending.  Case has been discussed with hospitalist, Dr. Sabino Gasser-- he will admit for ongoing care.  Final Clinical Impression(s) / ED Diagnoses Final diagnoses:  Altered mental status, unspecified altered mental status type    Rx / DC Orders ED Discharge Orders    None       Larene Pickett, PA-C 07/08/19 0603    Palumbo, April, MD 07/08/19 [phone]

## 2019-07-09 DIAGNOSIS — G92 Toxic encephalopathy: Principal | ICD-10-CM

## 2019-07-09 DIAGNOSIS — R4182 Altered mental status, unspecified: Secondary | ICD-10-CM

## 2019-07-09 LAB — GLUCOSE, CAPILLARY
Glucose-Capillary: 124 mg/dL — ABNORMAL HIGH (ref 70–99)
Glucose-Capillary: 115 mg/dL — ABNORMAL HIGH (ref 70–99)
Glucose-Capillary: 179 mg/dL — ABNORMAL HIGH (ref 70–99)
Glucose-Capillary: 165 mg/dL — ABNORMAL HIGH (ref 70–99)

## 2019-07-09 LAB — IRON AND TIBC
Iron: 20 ug/dL — ABNORMAL LOW (ref 28–170)
Saturation Ratios: 10 % — ABNORMAL LOW (ref 10.4–31.8)
TIBC: 193 ug/dL — ABNORMAL LOW (ref 250–450)
UIBC: 173 ug/dL

## 2019-07-09 LAB — CBC
HCT: 33.6 % — ABNORMAL LOW (ref 36.0–46.0)
Hemoglobin: 10.7 g/dL — ABNORMAL LOW (ref 12.0–15.0)
MCH: 24.9 pg — ABNORMAL LOW (ref 26.0–34.0)
MCHC: 31.8 g/dL (ref 30.0–36.0)
MCV: 78.3 fL — ABNORMAL LOW (ref 80.0–100.0)
Platelets: 204 10*3/uL (ref 150–400)
RBC: 4.29 MIL/uL (ref 3.87–5.11)
RDW: 20.9 % — ABNORMAL HIGH (ref 11.5–15.5)
WBC: 8.2 10*3/uL (ref 4.0–10.5)
nRBC: 0 % (ref 0.0–0.2)

## 2019-07-09 LAB — BASIC METABOLIC PANEL
Anion gap: 8 (ref 5–15)
BUN: 19 mg/dL (ref 8–23)
CO2: 25 mmol/L (ref 22–32)
Calcium: 9 mg/dL (ref 8.9–10.3)
Chloride: 109 mmol/L (ref 98–111)
Creatinine, Ser: 1.67 mg/dL — ABNORMAL HIGH (ref 0.44–1.00)
GFR calc Af Amer: 34 mL/min — ABNORMAL LOW (ref >60)
GFR calc non Af Amer: 29 mL/min — ABNORMAL LOW (ref >60)
Glucose, Bld: 121 mg/dL — ABNORMAL HIGH (ref 70–99)
Potassium: 4.7 mmol/L (ref 3.5–5.1)
Sodium: 142 mmol/L (ref 135–145)

## 2019-07-09 LAB — RETICULOCYTES
Immature Retic Fract: 11.6 % (ref 2.3–15.9)
RBC.: 4.21 MIL/uL (ref 3.87–5.11)
Retic Count, Absolute: 33.7 10*3/uL (ref 19.0–186.0)
Retic Ct Pct: 0.8 % (ref 0.4–3.1)

## 2019-07-09 LAB — AMMONIA: Ammonia: 20 umol/L (ref 9–35)

## 2019-07-09 LAB — FERRITIN: Ferritin: 50 ng/mL (ref 11–307)

## 2019-07-09 LAB — VITAMIN B12: Vitamin B-12: 810 pg/mL (ref 180–914)

## 2019-07-09 LAB — FOLATE: Folate: 13.1 ng/mL (ref >5.9)

## 2019-07-09 MED ORDER — ADULT MULTIVITAMIN W/MINERALS CH
1.0000 | ORAL_TABLET | Freq: Every day | ORAL | Status: DC
  Administered 2019-07-09 – 2019-07-14 (×5): 1 via ORAL
  Filled 2019-07-09 (×6): qty 1

## 2019-07-09 MED ORDER — HALOPERIDOL LACTATE 5 MG/ML IJ SOLN: 5.0000 mg | Freq: Once | INTRAMUSCULAR | Status: DC

## 2019-07-09 MED ORDER — ENSURE ENLIVE PO LIQD
237.0000 mL | Freq: Two times a day (BID) | ORAL | Status: DC
  Administered 2019-07-11 – 2019-07-14 (×7): 237 mL via ORAL

## 2019-07-09 MED ORDER — LEVOTHYROXINE SODIUM 50 MCG PO TABS
50.0000 ug | ORAL_TABLET | Freq: Every day | ORAL | Status: DC
  Administered 2019-07-11 – 2019-07-14 (×4): 50 ug via ORAL
  Filled 2019-07-09 (×5): qty 1

## 2019-07-09 MED ORDER — HALOPERIDOL LACTATE 5 MG/ML IJ SOLN
2.5000 mg | Freq: Once | INTRAMUSCULAR | Status: DC | PRN
  Filled 2019-07-09: qty 1

## 2019-07-09 MED ORDER — DONEPEZIL HCL 10 MG PO TABS
10.0000 mg | ORAL_TABLET | Freq: Every day | ORAL | Status: DC
  Administered 2019-07-09 – 2019-07-13 (×5): 10 mg via ORAL
  Filled 2019-07-09 (×5): qty 1

## 2019-07-09 NOTE — Procedures (Addendum)
Patient Name: NEOMI LAIDLER  MRN: [id number]  Epilepsy Attending: Lora Havens  Referring Physician/Provider: Dr. Amie Portland Date: 07/08/2019 Duration: 24.36 minutes  Patient history: 79 year old female who presented with new onset right facial droop, right-sided weakness and altered mental status.  MRI did not show any acute stroke.  EEG evaluate for seizures.  Level of alertness: Awake, asleep  AEDs during EEG study: None  Technical aspects: This EEG study was done with scalp electrodes positioned according to the 10-20 International system of electrode placement. Electrical activity was acquired at a sampling rate of 500Hz  and reviewed with a high frequency filter of 70Hz  and a low frequency filter of 1Hz . EEG data were recorded continuously and digitally stored.   Description: No clear posterior dominant rhythm was seen.  Sleep was characterized by vertex waves, maximal frontocentral region.  EEG showed initial was polymorphic 3 to 5 Hz theta-delta slowing. Hyperventilation and photic stimulation were not performed.  Abnormality - Continuous slow, generalized  IMPRESSION: This study is suggestive of moderate diffuse encephalopathy, nonspecific to etiology.  No seizures or epileptiform discharges were seen throughout the recording.  Micaiah Remillard Barbra Sarks

## 2019-07-09 NOTE — TOC Initial Note (Signed)
Transition of Care [facility]) - Initial/Assessment Note    Patient Details  Name: Caitlyn Marshall MRN: [id number] Date of Birth: [birth year]  Transition of Care [facility]) CM/SW Contact:    Bartholomew Crews, RN Phone Number: [phone] 07/09/2019, 2:59 PM  Clinical Narrative:                  Noted recommendations for [facility] PT, OT. Patient is currently active with [facility] for RN, PT, Aide. Patient will need [facility] orders for RN, PT, OT, Aide with Face to Face at dc.   TOC team following for transition needs.  Expected Discharge Plan: [place] Barriers to Discharge: Continued Medical Work up   Patient Goals and CMS Choice        Expected Discharge Plan and Services Expected Discharge Plan: [place]   Discharge Planning Services: CM Consult                               HH Arranged: RN, PT, Nurse's Aide [facility] Agency: [place] ([facility]) Date HH Agency Contacted: 07/09/19 Time [place]: 1459 Representative spoke with at [place]: Butch Penny  Prior Living Arrangements/Services                       Activities of Daily Living      Permission Sought/Granted                  Emotional Assessment              Admission diagnosis:  Altered mental status [R41.82] Altered mental status, unspecified altered mental status type [R41.82] Patient Active Problem List   Diagnosis Date Noted  . Altered mental status 07/08/2019  . Diabetic retinopathy ([place]) 02/23/2018  . Nodule of lower lobe of left lung 04/17/2017  . Insomnia 07/12/2015  . Hypertension 07/12/2015  . Poor appetite 07/12/2015  . Constipation 07/12/2015  . Bursitis 05/31/2015  . 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 Leg swelling 05/31/2015  . Allergic rhinitis 12/26/2014  . Vitamin D deficiency 12/20/2014  . Anxiety 12/20/2014  . Hypercholesterolemia 12/20/2014  . Anemia 12/20/2014  .  Hypothyroidism 12/20/2014  . Hypertensive CKD (chronic kidney disease) 12/20/2014  . Premature ventricular contraction 12/20/2014  . PVD (peripheral vascular disease) ([place]) 12/20/2014  . Gout 12/20/2014  . Osteoarthritis 12/20/2014  . Renal insufficiency 12/20/2014  . Chronic kidney disease (CKD), stage III (moderate) 12/20/2014   PCP:  Birdie Sons, MD Pharmacy:   [facility] [phone], [place] AT [place] [address] Phone: [phone] Fax: [phone]     Social Determinants of Health (SDOH) Interventions    Readmission Risk Interventions No flowsheet data found.

## 2019-07-09 NOTE — Evaluation (Addendum)
Physical Therapy Evaluation Patient Details Name: Caitlyn Marshall MRN: [id number] DOB: [birth year] Today's Date: 07/09/2019   History of Present Illness  Ms. Olmo is a 79 year old female with PMH hypothyroidism, HTN, hyperlipidemia, type 2 DM, CKD 3/4 who was brought to the ER from home after daughter found her gurgling and unresponsive in bed w/right-sided facial droop as well as right arm weakness and she was brought to the ER for further evaluation.The patient had also endorsed shortness of breath and vomiting recently at home.CT of the head revealed a questionable hypodensity in the right paramedian pons. MRI was negative for any acute infarcts. No pontine infarction as was questioned by CT. No additional acute findings related to cause of altered mental status     Clinical Impression  Patient presented sitting in chair, awake, and willing to participate in therapy. PTA, pt was independent with mobility and all ADL's, requiring assist with transportation to appointments- pt with impaired cognition so OT verified with pt's daughter Jeannene Patella. Pt lives with her son who has a hx of TBI, her other daughter and grandson frequently check on her. At the time of evaluation, pt was able to stand from chair with min guard for safety. She ambulated 200', initially with RW but progressed to no AD with min guard for safety. No overt LOB noted. Pt with dec memory, unable to recall prior OT session activities and unable to recall 3 words given after 5 minutes. She attempted to remember by frequently repeating the 3 words throughout gait training. Currently recommend [facility] PT to maximize patients mobility, safety, and independence. Pt would continue to benefit from skilled physical therapy services at this time while admitted and after d/c to address the below listed limitations in order to improve overall safety and independence with functional mobility.      Follow Up Recommendations Home health  PT;Supervision/Assistance - 24 hour;Supervision - Intermittent(may progress to just supervision)    Equipment Recommendations  None recommended by PT    Recommendations for Other Services       Precautions / Restrictions Precautions Precautions: Fall Restrictions Weight Bearing Restrictions: No      Mobility  Bed Mobility               General bed mobility comments: OOB in chair  Transfers Overall transfer level: Needs assistance Equipment used: Rolling walker (2 wheeled) Transfers: Sit to/from Stand Sit to Stand: Min guard         General transfer comment: min guard for safety, no physical assist needed  Ambulation/Gait Ambulation/Gait assistance: Min guard Gait Distance (Feet): 200 Feet Assistive device: Rolling walker (2 wheeled);None Gait Pattern/deviations: WFL(Within Functional Limits) Gait velocity: slightly dec   General Gait Details: initially amb w/RW and progressed to no AD. Min guard throughout for safety, no LOB noted, pt able to complete head turns without difficulty with RW  Stairs            Wheelchair Mobility    Modified Rankin (Stroke Patients Only)       Balance Overall balance assessment: Needs assistance Sitting-balance support: No upper extremity supported;Feet supported Sitting balance-Leahy Scale: Good     Standing balance support: Bilateral upper extremity supported;No upper extremity supported;During functional activity Standing balance-Leahy Scale: Good Standing balance comment: Pt static balance good, she was able to reach laterally to get cup. requires single UE support to stand on 1 leg  Pertinent Vitals/Pain Pain Assessment: Faces Faces Pain Scale: No hurt    Home Living Family/patient expects to be discharged to:: Private residence Living Arrangements: Children Available Help at Discharge: Family;Available PRN/intermittently Type of Home: House Home Access: Stairs  to enter Entrance Stairs-Rails: Can reach both Entrance Stairs-Number of Steps: 5 Home Layout: One level Home Equipment: Walker - 2 wheels Additional Comments: Pt poor historian, OT spoke with daughter in [place] who will be staying with pt for 2 weeks in early May and moving here permanently in Aug. Pt apparently has son w/hx of TBi who lives with her. Pt's other daughter and grandson check on pt frequently and have cameras in pt's home to check on her.    Prior Function Level of Independence: Independent         Comments: Pt and pt's daughter confirm pt was independent with mobility and ADL's     Hand Dominance        Extremity/Trunk Assessment   Upper Extremity Assessment Upper Extremity Assessment: Overall WFL for tasks assessed;Defer to OT evaluation    Lower Extremity Assessment Lower Extremity Assessment: Overall WFL for tasks assessed       Communication   Communication: No difficulties  Cognition Arousal/Alertness: Awake/alert Behavior During Therapy: WFL for tasks assessed/performed;Agitated Overall Cognitive Status: Impaired/Different from baseline Area of Impairment: Memory;Safety/judgement;Awareness                     Memory: Decreased short-term memory   Safety/Judgement: Decreased awareness of safety;Decreased awareness of deficits Awareness: Emergent   General Comments: Pt unable to recall 3 words after 50min, she repeated them mult times to herself throughout gait but forgot them. She was unable to recall session with OT just prior. She was slightly agitated to be in the hospital saying "people need to mind their own business"      General Comments General comments (skin integrity, edema, etc.): HR 78 at rest, 106 with activity    Exercises     Assessment/Plan    PT Assessment Patient needs continued PT services  PT Problem List Decreased strength;Decreased activity tolerance;Decreased balance;Decreased mobility;Decreased  coordination;Decreased safety awareness;Decreased cognition;Decreased knowledge of use of DME;Decreased knowledge of precautions       PT Treatment Interventions DME instruction;Gait training;Stair training;Functional mobility training;Therapeutic activities;Therapeutic exercise;Balance training;Neuromuscular re-education;Cognitive remediation;Patient/family education    PT Goals (Current goals can be found in the Care Plan section)  Acute Rehab PT Goals Patient Stated Goal: to go home PT Goal Formulation: With patient Time For Goal Achievement: 07/23/19 Potential to Achieve Goals: Good    Frequency Min 3X/week   Barriers to discharge        Co-evaluation               AM-PAC PT "6 Clicks" Mobility  Outcome Measure Help needed turning from your back to your side while in a flat bed without using bedrails?: None Help needed moving from lying on your back to sitting on the side of a flat bed without using bedrails?: None Help needed moving to and from a bed to a chair (including a wheelchair)?: A Little Help needed standing up from a chair using your arms (e.g., wheelchair or bedside chair)?: None Help needed to walk in hospital room?: A Little Help needed climbing 3-5 steps with a railing? : A Little 6 Click Score: 21    End of Session Equipment Utilized During Treatment: Gait belt Activity Tolerance: Patient tolerated treatment well Patient left: in chair;with call bell/phone  within reach;with chair alarm set Nurse Communication: Mobility status PT Visit Diagnosis: Unsteadiness on feet (R26.81);Other abnormalities of gait and mobility (R26.89)    Time: 2393-5940 PT Time Calculation (min) (ACUTE ONLY): 24 min   Charges:   PT Evaluation $PT Eval Low Complexity: 1 Low PT Treatments $Gait Training: 8-22 mins       Shiquita Collignon, SPT Acute Rehab  [phone]  Harmonie Verrastro 07/09/2019, 2:10 PM

## 2019-07-09 NOTE — Progress Notes (Addendum)
NEURO HOSPITALIST PROGRESS NOTE   Subjective: Patient awake, alert, NAD. Sitting up in bed.  Reports no complaints.  Exam: Vitals:   07/09/19 0431 07/09/19 0900  BP: (!) 155/47 (!) 161/53  Pulse: 66 74  Resp: 16 18  Temp: 98.7 F (37.1 C) 98.4 F (36.9 C)  SpO2: 95% 100%    Physical Exam  Constitutional: Appears well-developed and well-nourished.  Psych: Affect appropriate to situation Eyes: Normal external eye and conjunctiva. HENT: Normocephalic, no lesions, without obvious abnormality.   Musculoskeletal-no joint tenderness, deformity or swelling Cardiovascular: Normal rate and regular rhythm.  Respiratory: Effort normal, non-labored breathing saturations WNL GI: Soft.  No distension. There is no tenderness.  Skin: WDI   Neuro:  Mental Status: Alert, oriented to name and place, says she is 79, the year is 78 and month is august, thought content appropriate. No dysarthria (but dentures loose) Speech fluent without evidence of aphasia.  Able to follow  commands without difficulty. Cranial Nerves: II:  Visual fields grossly normal,  III,IV, VI: ptosis not present, extra-ocular motions intact bilaterally pupils equal, round, reactive to light and accommodation V,VII: smile symmetric, facial light touch sensation normal bilaterally VIII: hearing normal bilaterally IX,X: uvula rises symmetrically XI: bilateral shoulder shrug XII: midline tongue extension Motor: Right : Upper extremity   4+/5 Left:     Upper extremity   4+/5  Lower extremity   4+/5 Lower extremity   4+/5 Tone and bulk:normal tone throughout; no atrophy noted Sensory:  light touch intact throughout, bilaterally Deep Tendon Reflexes: 2+ and symmetric biceps, patella Cerebellar: No ataxia with FNF Gait: deferred    Medications:  Scheduled: . enoxaparin (LOVENOX) injection  30 mg Subcutaneous Q24H  . insulin aspart  0-9 Units Subcutaneous TID AC & HS    Continuous:  DQQ:[id number], labetalol  Pertinent Labs/Diagnostics: EEG  IMPRESSION: This study is suggestive of moderate diffuse encephalopathy, nonspecific to etiology.  No seizures or epileptiform discharges were seen throughout the recording.  Na: WNL  K: WNL B12: WNL Ammonia: 20 UA: - UTI UDS: WNL  MR ANGIO HEAD WO CONTRAST  Result Date: 07/08/2019 CLINICAL DATA:  Found unresponsive in bed. Right-sided facial droop and right arm weakness. Right pontine infarction question by CT. EXAM: MRI HEAD WITHOUT AND WITH CONTRAST MRA HEAD WITHOUT CONTRAST TECHNIQUE: Multiplanar, multiecho pulse sequences of the brain and surrounding structures were obtained without and with intravenous contrast. Angiographic images of the head were obtained using MRA technique without contrast. CONTRAST:  39mL GADAVIST GADOBUTROL 1 MMOL/ML IV SOLN COMPARISON:  Head CT same day FINDINGS: MRI HEAD FINDINGS Brain: Diffusion imaging does not show any acute or subacute infarction. No brainstem insult. Cerebellum shows age related atrophy but no focal insult. Cerebral hemispheres show age related atrophy with only very minimal small vessel change of the white matter. No cortical or large vessel territory infarction. No mass lesion, hemorrhage, hydrocephalus or extra-axial collection. After contrast administration, no abnormal enhancement occurs. Vascular: Major vessels at the base of the brain show flow. Skull and upper cervical spine: Negative Sinuses/Orbits: Minimal mucosal edematous changes. Small amount of fluid in the right mastoid tip. Orbits negative. Other: None MRA HEAD FINDINGS Both internal carotid arteries widely patent through the skull base and siphon regions. The anterior and middle cerebral vessels are patent without proximal stenosis, aneurysm or vascular malformation. More distal branch vessels do show some atherosclerotic irregularity.  Dominant right vertebral artery widely patent to the basilar. The  left vertebral artery is a small vessel, either developmental or secondary to acquired atherosclerosis. No basilar stenosis. Posterior circulation branch vessels are patent. Distal PCA branches show some atherosclerotic irregularity. IMPRESSION: No acute or reversible brain finding. Mild age related volume loss. Mild small vessel change of the hemispheric white matter. No pontine infarction as was questioned by CT. No explanation for the mental status changes. MR angiography does not show any acute finding. Mild intracranial atherosclerotic irregularity of the distal branch vessels. Small left vertebral artery could be congenital or due to acquired atherosclerosis. Electronically Signed   By: Nelson Chimes M.D.   On: 07/08/2019 09:58   MR BRAIN W WO CONTRAST  Result Date: 07/08/2019 CLINICAL DATA:  Found unresponsive in bed. Right-sided facial droop and right arm weakness. Right pontine infarction question by CT. EXAM: MRI HEAD WITHOUT AND WITH CONTRAST MRA HEAD WITHOUT CONTRAST TECHNIQUE: Multiplanar, multiecho pulse sequences of the brain and surrounding structures were obtained without and with intravenous contrast. Angiographic images of the head were obtained using MRA technique without contrast. CONTRAST:  36mL GADAVIST GADOBUTROL 1 MMOL/ML IV SOLN COMPARISON:  Head CT same day FINDINGS: MRI HEAD FINDINGS Brain: Diffusion imaging does not show any acute or subacute infarction. No brainstem insult. Cerebellum shows age related atrophy but no focal insult. Cerebral hemispheres show age related atrophy with only very minimal small vessel change of the white matter. No cortical or large vessel territory infarction. No mass lesion, hemorrhage, hydrocephalus or extra-axial collection. After contrast administration, no abnormal enhancement occurs. Vascular: Major vessels at the base of the brain show flow. Skull and upper cervical spine: Negative Sinuses/Orbits: Minimal mucosal edematous changes. Small amount of  fluid in the right mastoid tip. Orbits negative. Other: None MRA HEAD FINDINGS Both internal carotid arteries widely patent through the skull base and siphon regions. The anterior and middle cerebral vessels are patent without proximal stenosis, aneurysm or vascular malformation. More distal branch vessels do show some atherosclerotic irregularity. Dominant right vertebral artery widely patent to the basilar. The left vertebral artery is a small vessel, either developmental or secondary to acquired atherosclerosis. No basilar stenosis. Posterior circulation branch vessels are patent. Distal PCA branches show some atherosclerotic irregularity. IMPRESSION: No acute or reversible brain finding. Mild age related volume loss. Mild small vessel change of the hemispheric white matter. No pontine infarction as was questioned by CT. No explanation for the mental status changes. MR angiography does not show any acute finding. Mild intracranial atherosclerotic irregularity of the distal branch vessels. Small left vertebral artery could be congenital or due to acquired atherosclerosis. Electronically Signed   By: Nelson Chimes M.D.   On: 07/08/2019 09:58   NM PET Image Initial (PI) Skull Base To Thigh  Result Date: 07/07/2019 CLINICAL DATA:  Initial treatment strategy for lung mass. EXAM: NUCLEAR MEDICINE PET SKULL BASE TO THIGH TECHNIQUE: 5.6 mCi F-18 FDG was injected intravenously. Full-ring PET imaging was performed from the skull base to thigh after the radiotracer. CT data was obtained and used for attenuation correction and anatomic localization. Fasting blood glucose: 139 mg/dl COMPARISON:  Prior chest CTs most recent with the chest abdomen and pelvis study of June 15, 2019 FINDINGS: Mediastinal blood pool activity: SUV max 1.84 Liver activity: SUV max NA NECK: No hypermetabolic lymph nodes in the neck. Incidental CT findings: none CHEST: Left juxta hilar mass in the lingula distorting the fissure and inseparable  from the left cardiac  border and extending from the hilum into the left chest measuring approximately 4.8 x 3.8 cm (SUVmax = 7.4) quite similar to its appearance on the diagnostic CT obtained on 06/15/2019 Associated with hypermetabolic right paratracheal, precarinal, subcarinal and left hilar adenopathyw (Image 94, series 3): Hypermetabolic subcarinal lymph node 8 mm short axis (SUVmax = 6.0) Precarinal adenopathy similar size (SUVmax = 5.0) (image 86, series 3 Mildly hypermetabolic high right paratracheal lymph node (image 75, series 3) (SUVmax = 3.2 Left-sided pleural effusion with mild hypermetabolic activity at the periphery most areas corresponding to collapsed lung with metabolic activity slightly greater than blood pool. Mildly increased metabolic activity about the right hilum (image 102, series 3) (SUVmax = 2.8) 7 mm lymph node in this location. Background marked pulmonary emphysema with bullous changes in the right lower lobe. Along the margin of 1 of these areas of bullous disease is an area of nodularity that is unchanged in the short interval measuring approximately 2 x 1.9 cm (image 110, series 3) (SUVmax = 2.3) Incidental CT findings: calcified atheromatous plaque throughout the thoracic aorta. Mass abutting the left cardiac border. Small pericardial effusion. No chest wall lesion. No new finding since the previous CT study. Airways are patent. Left pleural effusion with similar volume to slightly diminished volume. ABDOMEN/PELVIS: Asymmetric renal activity with upper pole activity that extend slightly beyond the contour of the left kidney but without CT correlate. A similar appearance in the upper pole the right kidney where there is cortical scarring. No hypermetabolic areas in other solid organs. Changes of chronic calcific pancreatitis. No abdominal adenopathy. Crowding of bowel loops in the abdomen due to cachexia and limited intra-abdominal fat limiting assessment. Incidental CT findings:  Low-density lesions in the liver likely small cysts. Limited visualization of the adrenal glands which are mildly nodular but not associated with increased FDG uptake beyond liver or blood pool. Numerous bilateral intrarenal calculi and likely parenchymal calculi in the kidneys. No hydronephrosis. No visible renal lesion. Limited assessment of bowel loops due to lack of mesenteric and intra-abdominal fat with similar appearance of visualized bowel accounting for lack of contrast on today's study is compared to the study of 06/15/2019. SKELETON: No focal hypermetabolic activity to suggest skeletal metastasis. Incidental CT findings: Spinal degenerative changes. IMPRESSION: 1. Hypermetabolic mass in the left chest with contralateral mediastinal adenopathy showing hypermetabolic features, suspicious for primary bronchogenic neoplasm. 2. Lesion distorts the fissure and is inseparable from the left heart border. 3. Left effusion without discrete marked hypermetabolic features. Collapsed lung along the anterior margin displaying slightly greater than blood pool activity. 4. Mild increased metabolism in right hilar lymph nodes and a focus of nodularity along bullous changes in the right lower lobe. Finding may represent a second primary or metastatic disease, increased in size since the study of May 01, 2017. Currently approximately 2 x 1.9 cm, area of concern along the anterolateral margin of this cystic and or bullous area previously measuring approximately 15 x 11 mm in 2019. 5. Areas of activity not registering well with the renal profile on CT images likely associated with pooling of FDG within excreted urine in areas of patulous collecting systems or in dependent collecting systems. Dedicated renal imaging may be helpful for further assessment, ultrasound could be considered given small size of the patient as renal function may not allow for administration of intravenous contrast. 6. Changes of chronic calcific  pancreatitis. Electronically Signed   By: Zetta Bills M.D.   On: 07/07/2019 16:12   DG  Chest Port 1 View  Result Date: 07/08/2019 CLINICAL DATA:  Cough. Known lung mass. EXAM: PORTABLE CHEST 1 VIEW COMPARISON:  PET CT yesterday. Radiograph 06/25/2019. Chest CT 06/15/2019 FINDINGS: Left pleuroparenchymal opacity corresponds to lung mass and pleural effusion on PET CT yesterday. Emphysema. Bulla in the right lower lung zone with adjacent nodular consolidation again seen. Left hilar calcified nodes. No pneumothorax. Heart is normal in size. Pancreatic calcifications in the upper abdomen. IMPRESSION: 1. Left pleuroparenchymal opacity corresponds to lung mass and pleural effusion, characterized on PET CT yesterday. 2. Emphysema.  No acute findings by radiograph. Electronically Signed   By: Keith Rake M.D.   On: 07/08/2019 03:16   EEG adult  Result Date: 07/09/2019 Lora Havens, MD     07/09/2019  8:31 AM Patient Name: GLENDA SPELMAN MRN: [id number] Epilepsy Attending: Lora Havens Referring Physician/Provider: Dr. Amie Portland Date: 07/08/2019 Duration: 24.36 minutes Patient history: 79 year old female who presented with new onset right facial droop, right-sided weakness and altered mental status.  MRI did not show any acute stroke.  EEG evaluate for seizures. Level of alertness: Awake, asleep AEDs during EEG study: None Technical aspects: This EEG study was done with scalp electrodes positioned according to the 10-20 International system of electrode placement. Electrical activity was acquired at a sampling rate of 500Hz  and reviewed with a high frequency filter of 70Hz  and a low frequency filter of 1Hz . EEG data were recorded continuously and digitally stored. Description: No clear posterior dominant rhythm was seen.  Sleep was characterized by vertex waves, maximal frontocentral region.  EEG showed initial was polymorphic 3 to 5 Hz theta-delta slowing. Hyperventilation and photic stimulation  were not performed. Abnormality - Continuous slow, generalized IMPRESSION: This study is suggestive of moderate diffuse encephalopathy, nonspecific to etiology.  No seizures or epileptiform discharges were seen throughout the recording. Lora Havens   CT HEAD CODE STROKE WO CONTRAST  Result Date: 07/08/2019 CLINICAL DATA:  Code stroke. Initial evaluation for acute altered mental status, right-sided weakness. EXAM: CT HEAD WITHOUT CONTRAST TECHNIQUE: Contiguous axial images were obtained from the base of the skull through the vertex without intravenous contrast. COMPARISON:  None. FINDINGS: Brain: Generalized age-related cerebral atrophy with moderate chronic microischemic disease. No acute intracranial hemorrhage. There is question of a focal hypodensity involving the right paramedian pons, difficult to be certain as this area is prone artifact on CT. No other acute large vessel territory infarct. No mass lesion, midline shift or mass effect. No hydrocephalus or extra-axial fluid collection. Vascular: No hyperdense vessel. Calcified atherosclerosis present at skull base. Skull: Scalp soft tissues and calvarium within normal limits. Sinuses/Orbits: Globes orbital soft tissues demonstrate no acute finding. Paranasal sinuses are largely clear. Small chronic appearing right mastoid effusion noted. Left mastoid air cells are clear. Other: None. ASPECTS [facility] Stroke Program Early CT Score) - Ganglionic level infarction (caudate, lentiform nuclei, internal capsule, insula, M1-M3 cortex): 7 - Supraganglionic infarction (M4-M6 cortex): 3 Total score (0-10 with 10 being normal): 10 IMPRESSION: 1. Question a focal hypodensity involving the right paramedian pons, which could reflect an evolving acute ischemic infarct. Difficult to be certain of this finding as this area is prone to artifact on CT. No intracranial hemorrhage. 2. ASPECTS is 10. 3. Age-related cerebral atrophy with chronic microvascular ischemic  disease. These results were communicated to Dr. Cheral Marker at 2:25 [place] 4/22/2021by text page via the [facility] messaging system. Electronically Signed   By: Jeannine Boga M.D.   On: 07/08/2019 02:30  EEG: Moderate diffuse encephalopathy nonspecific in etiology.  No seizures.  Assessment: 79 year old woman past history of diabetes, chronic kidney disease, dementia, metastatic cancer, gout hypertension hyperlipidemia brought in as a code stroke for new onset facial droop right arm weakness and altered mental status.  Very cachectic looking with no lateralized deficit on initial exam or on exam today. MRI MRI of the brain negative for acute process Likely some sort of toxic metabolic encephalopathy that has resolved. Based on her appearance, weight and body mass index it appears that she has failure to thrive, likely secondary to her metastatic cancer.  Recommendations:  Supportive care per primary team Further management of the mass seen on PET scan per oncology Neurology will be available as needed  Laurey Morale, MSN, NP-C [facility] [phone]   Attending Neurohospitalist Addendum Patient seen and examined with APP/Resident. Agree with the history and physical as documented above. Agree with the plan as documented, which I helped formulate. I have independently reviewed the chart, obtained history, review of systems and examined the patient.I have personally reviewed pertinent head/neck/spine imaging (CT/MRI). Please feel free to call with any questions. --- Amie Portland, MD [facility] Pager: [phone]  If 7pm to 7am, please call on call as listed on AMION.

## 2019-07-09 NOTE — Progress Notes (Signed)
PT Progress Note for Charges    07/09/19 1358  PT Visit Information  Last PT Received On 07/09/19  PT General Charges  $$ ACUTE PT VISIT 1 Visit  PT Evaluation  $PT Eval Low Complexity 1 Low  PT Treatments  $Gait Training 8-22 mins  Anastasio Champion, DPT  Acute Rehabilitation Services Pager [phone] Office [phone]

## 2019-07-09 NOTE — Progress Notes (Signed)
Initial Nutrition Assessment  DOCUMENTATION CODES:   Underweight  INTERVENTION:   Ensure Enlive po BID, each supplement provides 350 kcal and 20 grams of protein  Liberalize diet to REGULAR  MVI with Minerals daily   NUTRITION DIAGNOSIS:   Inadequate oral intake related to cancer and cancer related treatments, poor appetite as evidenced by per patient/family report.  GOAL:   Patient will meet greater than or equal to 90% of their needs  MONITOR:   PO intake, Supplement acceptance, Labs, Weight trends  REASON FOR ASSESSMENT:   Malnutrition Screening Tool    ASSESSMENT:   79 yo female admitted with AMS of unknown etiology. Of note, pt has been undergoing work-up from lung mass associated with unintentional weight loss and poor appetite. PET scan on 4/21 suggestive of malignancy. PMH includes HTN, HLD, DM, CKD III-IV  RD working remotely.  Pt is alert but not oriented.  Pt ate 75% this AM, no po last night.   Pt likely meets clinical characteristics for malnutrition given report of unintentional weight loss with poor po intake in setting of likely malignancy, pt is also underweight. Needs nutrition-focused physical exam on follow-up and further info regarding diet and weight history if possible to recommend malnutrition diagnosis.   BMI 13; current weight 36.8 kg. Per weight encounters, definite weight loss trend noted. Weight of 45 kg in March 2021, 42 kg in June 202. 49 kg in March of 2020.  Labs: reviewed Meds: ss novolog   Diet Order:  CARB MODIFIED  EDUCATION NEEDS:   Not appropriate for education at this time  Skin:  Skin Assessment: Reviewed RN Assessment  Last BM:  PTA  Height:   Ht Readings from Last 1 Encounters:  08/26/18 5\' 6"  (1.676 m)    Weight:   Wt Readings from Last 1 Encounters:  07/08/19 36.8 kg    Ideal Body Weight:  59 kg  BMI:  Body mass index is 13.09 kg/m.  Estimated Nutritional Needs:   Kcal:  1400-1600  kcals  Protein:  70-80 g  Fluid:  >/= 1.4 l  Kerman Passey MS, RDN, LDN, CNSC RD Pager Number and RD On-Call Pager Number Located in [place]

## 2019-07-09 NOTE — Telephone Encounter (Signed)
I spoke with patient's daughter [name] and advised her that she should check with patient's insurance to see which meter is preferred and covered, then have Dr. Nilda Simmer send a prescription for it since she manages patients diabetes. [name] verbalized understanding.

## 2019-07-09 NOTE — Progress Notes (Signed)
Occupational Therapy Evaluation Patient Details Name: Caitlyn Marshall MRN: [id number] DOB: 1940/08/09 Today's Date: 07/09/2019    History of Present Illness  [name] is a 79 year old female with PMH hypothyroidism, HTN, hyperlipidemia, type 2 DM, CKD 3/4 who was brought to the ER from home after daughter found her gurgling and unresponsive in bed w/right-sided facial droop as well as right arm weakness and she was brought to the ER for further evaluation.The patient had also endorsed shortness of breath and vomiting recently at home.CT of the head revealed a questionable hypodensity in the right paramedian pons. MRA revelaed mild small vessel change of the hemispheric white matter. No pontine infarction as was questioned by CT. No additional acute findings   Clinical Impression   PLOF obtained from pt and pt's daughter Caitlyn Marshall) via phone. PTA, pt lives with son who has hx of TBI. Pt's daughter and grandson check on pt frequently, provided assistance with transportation, and IADLs as needed. Pt's daughter Caitlyn Marshall) who lives in [place] plans to move to [facility] permanently to assist with pt due to gradual cognitive decline. Pt typically able to complete ADLs and mobility in the home without AD. Pt min guard for mobility in room without AD, one LOB with min guard to correct and pt reaching for bed rail to stabilize self. Pt min guard for toileting task and hand hygiene at sink to ensure safety on feet. As activity continued, pt's steadiness improved. Based on pt's current cognitive presentation, recommend 24/7 supervision at home. Recommend HHOT to follow-up vs pt may progress to no OT follow up needed. Will continue to follow acutely.    Follow Up Recommendations  Home health OT;Supervision/Assistance - 24 hour(vs no OT follow-up if progresses well)    Equipment Recommendations  Other (comment)(TBD)    Recommendations for Other Services       Precautions / Restrictions Precautions Precautions:  Fall Restrictions Weight Bearing Restrictions: No      Mobility Bed Mobility Overal bed mobility: Modified Independent             General bed mobility comments: Modified Independent with increased time  Transfers Overall transfer level: Modified independent Equipment used: None Transfers: Sit to/from [facility] Sit to Stand: Min guard Stand pivot transfers: Min guard       General transfer comment: min guard for safety, no physical assist needed    Balance Overall balance assessment: Needs assistance Sitting-balance support: No upper extremity supported;Feet supported Sitting balance-Leahy Scale: Good     Standing balance support: During functional activity;No upper extremity supported Standing balance-Leahy Scale: Fair Standing balance comment: one LOB without AD, able to self correct                           ADL either performed or assessed with clinical judgement   ADL Overall ADL's : Needs assistance/impaired Eating/Feeding: Independent;Sitting   Grooming: Standing;Supervision/safety;Wash/dry hands   Upper Body Bathing: Supervision/ safety;Sitting   Lower Body Bathing: Min guard;Sitting/lateral leans;Sit to/from stand   Upper Body Dressing : Supervision/safety;Sitting;Standing   Lower Body Dressing: Min guard;Sit to/from stand;Sitting/lateral leans   Toilet Transfer: Min guard;Stand-pivot;Ambulation;Regular Glass blower/designer Details (indicate cue type and reason): Min guard for mobility to bathroom without AD, one LOB min guard to correct Toileting- Clothing Manipulation and Hygiene: Min guard;Sit to/from stand Toileting - Clothing Manipulation Details (indicate cue type and reason): min guard to ensure balance for hygiene and clothing mgmt     Functional  mobility during ADLs: Min guard General ADL Comments: min guard at most due to unsteadiness (may be due to first time OOB - improved with prolonged activity)      Vision         Perception     Praxis      Pertinent Vitals/Pain Pain Assessment: Faces Faces Pain Scale: No hurt     Hand Dominance Right   Extremity/Trunk Assessment Upper Extremity Assessment Upper Extremity Assessment: Generalized weakness   Lower Extremity Assessment Lower Extremity Assessment: Overall WFL for tasks assessed       Communication Communication Communication: No difficulties   Cognition Arousal/Alertness: Awake/alert Behavior During Therapy: WFL for tasks assessed/performed;Agitated Overall Cognitive Status: Impaired/Different from baseline Area of Impairment: Memory;Safety/judgement;Awareness                     Memory: Decreased short-term memory   Safety/Judgement: Decreased awareness of safety;Decreased awareness of deficits Awareness: Emergent   General Comments: Pt with confusion on reasoning of hospital admission. Pt with difficulty recalling home living situation   General Comments  HR up to 130s briefly during activity, not sustained, high 80s after activity. RN in to change tele battery at end of OT session     Exercises     Shoulder Instructions      Home Living Family/patient expects to be discharged to:: Private residence Living Arrangements: Children(Son with TBI) Available Help at Discharge: Family;Available PRN/intermittently(daughter, grandson check on pt, assist with IADLs prn) Type of Home: House Home Access: Stairs to enter [employer] of Steps: 5 Entrance Stairs-Rails: Can reach both Home Layout: One level     Bathroom Shower/Tub: Tub/shower unit         Home Equipment: Environmental consultant - 2 wheels   Additional Comments: Pt poor historian, OT spoke with daughter in [place] who will be staying with pt for 2 weeks in early May and moving here permanently in Aug. Pt apparently has son w/hx of TBi who lives with her. Pt's other daughter and grandson check on pt frequently and have cameras in pt's  home to check on her.      Prior Functioning/Environment Level of Independence: Independent        Comments: Pt and pt's daughter confirm pt was independent with mobility and ADL's        OT Problem List: Decreased strength;Decreased activity tolerance;Impaired balance (sitting and/or standing);Decreased cognition      OT Treatment/Interventions: Self-care/ADL training;Therapeutic exercise;Energy conservation;DME and/or AE instruction;Therapeutic activities;Patient/family education    OT Goals(Current goals can be found in the care plan section) Acute Rehab OT Goals Patient Stated Goal: to go home OT Goal Formulation: With patient Time For Goal Achievement: 07/23/19 Potential to Achieve Goals: Good ADL Goals Pt Will Perform Grooming: with modified independence;standing Pt Will Perform Lower Body Bathing: with modified independence;sit to/from stand;sitting/lateral leans Pt Will Perform Lower Body Dressing: with modified independence;sitting/lateral leans;sit to/from stand Pt Will Transfer to Toilet: with supervision;ambulating;regular height toilet Pt Will Perform Toileting - Clothing Manipulation and hygiene: Independently;sit to/from stand;sitting/lateral leans  OT Frequency: Min 3X/week   Barriers to D/C:            Co-evaluation              AM-PAC OT "6 Clicks" Daily Activity     Outcome Measure Help from another person eating meals?: None Help from another person taking care of personal grooming?: A Little Help from another person toileting, which includes using toliet, bedpan, or urinal?:  A Little Help from another person bathing (including washing, rinsing, drying)?: A Little Help from another person to put on and taking off regular upper body clothing?: A Little Help from another person to put on and taking off regular lower body clothing?: A Little 6 Click Score: 19   End of Session Equipment Utilized During Treatment: Gait belt Nurse Communication:  Mobility status  Activity Tolerance: Patient tolerated treatment well Patient left: in chair;with call bell/phone within reach;with chair alarm set  OT Visit Diagnosis: Unsteadiness on feet (R26.81);Other abnormalities of gait and mobility (R26.89);Muscle weakness (generalized) (M62.81)                Time: 2549-8264 OT Time Calculation (min): 23 min Charges:  OT General Charges $OT Visit: 1 Visit OT Evaluation $OT Eval Moderate Complexity: 1 Mod OT Treatments $Self Care/Home Management : 8-22 mins  Layla Maw, OTR/L  Layla Maw 07/09/2019, 2:18 PM

## 2019-07-09 NOTE — Plan of Care (Signed)
  Problem: Education: Goal: Knowledge of General Education information will improve Description Including pain rating scale, medication(s)/side effects and non-pharmacologic comfort measures Outcome: Progressing   

## 2019-07-09 NOTE — Telephone Encounter (Signed)
Please advise if ok to remove medications listed below from patient's active medication list.

## 2019-07-09 NOTE — Progress Notes (Signed)
[facility] TEAM 1 - Stepdown/ICU TEAM  CJ BEECHER  RSW:[id number] DOB: 14-May-1940 DOA: 07/08/2019 PCP: Birdie Sons, MD    Brief Narrative:  79 year old with a history of hypothyroidism, HTN, HLD, DM 2, and chronic kidney disease stage III-IV who was brought to the ED after her daughter found her gurgling and unresponsive in her bed.  She had a right-sided facial droop and apparent right arm weakness.  History also revealed apparent shortness of breath and vomiting at home.  In the ED the patient herself is altered and unable to provide a reliable history though she was awake.  At initial presentation the patient was a code stroke but this was canceled after neurology evaluated the patient.  Most of her focal neurologic symptoms had resolved at the time of arrival in the ED.  CT of the head revealed a questionable hypodensity in the right paramedian pons suggestive of ischemia.  Of note the patient was undergoing an outpatient work-up for a lung mass accompanied by unintentional weight loss and poor appetite.  She had a PET scan 4/21 which was notable for a left-sided hypermetabolic mass suggestive of a malignancy, with a similar focus in the right lung.  Significant Events: 4/22 admit via [place] w/ AMS  4/23 EEG -moderate diffuse encephalopathy with no seizure or epileptiform discharges seen  Subjective: Resting comfortably in bed.  Denies any complaints whatsoever.  Is anxious to get home.  Is alert oriented and conversant.  Tells me she has intermittent episodes at home and she feels confused.  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 [name] oncology - based upon prior plan patient will need biopsy given positive PET scan results -given the patient is anxious to be discharged and  that she is already connected with oncology will allow her to follow-up on this as an outpatient  Leukocytosis Likely simple demargination/reactive -no evidence of focal infection at present -resolved as of today  Chronic kidney disease stage III-IV Baseline creatinine 1.8-2 -creatinine at baseline  DM 2 CBG well controlled presently -CBG controlled  Hypothyroidism Continue home Synthroid dose  DVT prophylaxis: Lovenox Code Status: FULL CODE Family Communication:  Disposition Plan: Potential for discharge home 4/24 if mental status remains stable -she was living in an independent home and [place] with family prior to admission and it is felt that she is stable to return to this environment with home health assistance and family supervision  Consultants:  Neurology  Antimicrobials:  None  Objective: Blood pressure (!) 161/53, pulse 74, temperature 98.4 F (36.9 C), temperature source Oral, resp. rate 18, weight 36.8 kg, SpO2 100 %.  Intake/Output Summary (Last 24 hours) at 07/09/2019 0952 Last data filed at 07/09/2019 0900 Gross per 24 hour  Intake 300 ml  Output 500 ml  Net -200 ml   Filed Weights   07/08/19 1407 07/08/19 2144  Weight: 37.1 kg 36.8 kg    Examination: General: No acute respiratory distress Lungs: Clear to auscultation bilaterally without wheezes or crackles Cardiovascular: Regular rate and rhythm without murmur gallop or rub normal S1 and S2 Abdomen: Nontender, nondistended, soft, bowel sounds positive, no rebound, no ascites, no appreciable mass Extremities: No significant cyanosis, clubbing, or edema bilateral lower extremities   CBC: Recent Labs  Lab 07/08/19 0200 07/08/19  0206 07/09/19 0359  WBC 14.3*  --  8.2  NEUTROABS 9.9*  --   --   HGB 11.6* 12.6 10.7*  HCT 37.6 37.0 33.6*  MCV 80.0  --  78.3*  PLT 198  --  536   Basic Metabolic Panel: Recent Labs  Lab 07/08/19 0200 07/08/19 0206 07/09/19 0359  NA 139 140 142  K 4.2 3.8  4.7  CL 105 105 109  CO2 21*  --  25  GLUCOSE 145* 149* 121*  BUN 22 23 19   CREATININE 1.78* 1.60* 1.67*  CALCIUM 9.0  --  9.0   GFR: CrCl cannot be calculated (Unknown ideal weight.).  Liver Function Tests: Recent Labs  Lab 07/08/19 0200  AST 17  ALT 8  ALKPHOS 76  BILITOT 0.9  PROT 6.1*  ALBUMIN 2.9*    Recent Labs  Lab 07/09/19 0359  AMMONIA 20    Coagulation Profile: Recent Labs  Lab 07/08/19 0200  INR 1.1   HbA1C: Hemoglobin A1C  Date/Time Value Ref Range Status  08/05/2018 12:00 AM 7.7  Final  12/25/2016 12:00 AM 8.1  Final   Hgb A1c MFr Bld  Date/Time Value Ref Range Status  07/08/2019 02:00 AM 7.9 (H) 4.8 - 5.6 % Final    Comment:    (NOTE) Pre diabetes:          5.7%-6.4% Diabetes:              >6.4% Glycemic control for   <7.0% adults with diabetes   05/31/2019 10:59 AM 7.2 (H) 4.8 - 5.6 % Final    Comment:             Prediabetes: 5.7 - 6.4          Diabetes: >6.4          Glycemic control for adults with diabetes: <7.0     CBG: Recent Labs  Lab 07/07/19 0921 07/08/19 1650 07/08/19 2145 07/09/19 0638  GLUCAP 139* 110* 155* 124*    Recent Results (from the past 240 hour(s))  Respiratory Panel by RT PCR (Flu A&B, Covid) - Nasopharyngeal Swab     Status: None   Collection Time: 07/08/19  3:13 AM   Specimen: Nasopharyngeal Swab  Result Value Ref Range Status   SARS Coronavirus 2 by RT PCR NEGATIVE NEGATIVE Final    Comment: (NOTE) SARS-CoV-2 target nucleic acids are NOT DETECTED. The SARS-CoV-2 RNA is generally detectable in upper respiratoy specimens during the acute phase of infection. The lowest concentration of SARS-CoV-2 viral copies this assay can detect is 131 copies/mL. A negative result does not preclude SARS-Cov-2 infection and should not be used as the sole basis for treatment or other patient management decisions. A negative result may occur with  improper specimen collection/handling, submission of specimen  other than nasopharyngeal swab, presence of viral mutation(s) within the areas targeted by this assay, and inadequate number of viral copies (<131 copies/mL). A negative result must be combined with clinical observations, patient history, and epidemiological information. The expected result is Negative. Fact Sheet for Patients:  PinkCheek.be Fact Sheet for Healthcare Providers:  GravelBags.it This test is not yet ap proved or cleared by the [place] FDA and  has been authorized for detection and/or diagnosis of SARS-CoV-2 by FDA under an Emergency Use Authorization (EUA). This EUA will remain  in effect (meaning this test can be used) for the duration of the COVID-19 declaration under Section 564(b)(1) of the Act, 21 U.S.C. section 360bbb-3(b)(1), unless the authorization  is terminated or revoked sooner.    Influenza A by PCR NEGATIVE NEGATIVE Final   Influenza B by PCR NEGATIVE NEGATIVE Final    Comment: (NOTE) The Xpert Xpress SARS-CoV-2/FLU/RSV assay is intended as an aid in  the diagnosis of influenza from Nasopharyngeal swab specimens and  should not be used as a sole basis for treatment. Nasal washings and  aspirates are unacceptable for Xpert Xpress SARS-CoV-2/FLU/RSV  testing. Fact Sheet for Patients: PinkCheek.be Fact Sheet for Healthcare Providers: GravelBags.it This test is not yet approved or cleared by the [place] FDA and  has been authorized for detection and/or diagnosis of SARS-CoV-2 by  FDA under an Emergency Use Authorization (EUA). This EUA will remain  in effect (meaning this test can be used) for the duration of the  Covid-19 declaration under Section 564(b)(1) of the Act, 21  U.S.C. section 360bbb-3(b)(1), unless the authorization is  terminated or revoked. Performed at [facility], [place] [address], [place], [place] [address]       Scheduled Meds: . enoxaparin (LOVENOX) injection  30 mg Subcutaneous Q24H  . insulin aspart  0-9 Units Subcutaneous TID AC & HS     LOS: 1 day    Cherene Altes, MD [facility] Office  [phone] Pager - Text Page per Amion as per below:  On-Call/Text Page:      [email or web address]  If 7PM-7AM, please contact night-coverage [email or web address] 07/09/2019, 9:52 AM

## 2019-07-10 LAB — COMPREHENSIVE METABOLIC PANEL
ALT: 12 U/L (ref 0–44)
AST: 21 U/L (ref 15–41)
Albumin: 3.2 g/dL — ABNORMAL LOW (ref 3.5–5.0)
Alkaline Phosphatase: 81 U/L (ref 38–126)
Anion gap: 11 (ref 5–15)
BUN: 16 mg/dL (ref 8–23)
CO2: 24 mmol/L (ref 22–32)
Calcium: 9.3 mg/dL (ref 8.9–10.3)
Chloride: 104 mmol/L (ref 98–111)
Creatinine, Ser: 1.46 mg/dL — ABNORMAL HIGH (ref 0.44–1.00)
GFR calc Af Amer: 40 mL/min — ABNORMAL LOW (ref >60)
GFR calc non Af Amer: 34 mL/min — ABNORMAL LOW (ref >60)
Glucose, Bld: 223 mg/dL — ABNORMAL HIGH (ref 70–99)
Potassium: 4.5 mmol/L (ref 3.5–5.1)
Sodium: 139 mmol/L (ref 135–145)
Total Bilirubin: 0.9 mg/dL (ref 0.3–1.2)
Total Protein: 6.4 g/dL — ABNORMAL LOW (ref 6.5–8.1)

## 2019-07-10 LAB — GLUCOSE, CAPILLARY
Glucose-Capillary: 164 mg/dL — ABNORMAL HIGH (ref 70–99)
Glucose-Capillary: 205 mg/dL — ABNORMAL HIGH (ref 70–99)
Glucose-Capillary: 150 mg/dL — ABNORMAL HIGH (ref 70–99)
Glucose-Capillary: 210 mg/dL — ABNORMAL HIGH (ref 70–99)

## 2019-07-10 LAB — CBC
HCT: 38.5 % (ref 36.0–46.0)
Hemoglobin: 12.2 g/dL (ref 12.0–15.0)
MCH: 24.8 pg — ABNORMAL LOW (ref 26.0–34.0)
MCHC: 31.7 g/dL (ref 30.0–36.0)
MCV: 78.4 fL — ABNORMAL LOW (ref 80.0–100.0)
Platelets: 230 10*3/uL (ref 150–400)
RBC: 4.91 MIL/uL (ref 3.87–5.11)
RDW: 20.9 % — ABNORMAL HIGH (ref 11.5–15.5)
WBC: 8.1 10*3/uL (ref 4.0–10.5)
nRBC: 0 % (ref 0.0–0.2)

## 2019-07-10 LAB — TSH: TSH: 2.856 u[IU]/mL (ref 0.350–4.500)

## 2019-07-10 MED ORDER — LORAZEPAM 2 MG/ML IJ SOLN: 1.0000 mg | INTRAMUSCULAR | Status: DC | PRN

## 2019-07-10 MED ORDER — QUETIAPINE FUMARATE 25 MG PO TABS
25.0000 mg | ORAL_TABLET | Freq: Every day | ORAL | Status: DC
  Administered 2019-07-10 – 2019-07-13 (×4): 25 mg via ORAL
  Filled 2019-07-10 (×4): qty 1

## 2019-07-10 MED ORDER — LORAZEPAM 2 MG/ML PO CONC: 1.0000 mg | ORAL | Status: DC | PRN

## 2019-07-10 MED ORDER — LORAZEPAM 1 MG PO TABS
1.0000 mg | ORAL_TABLET | ORAL | Status: DC | PRN
  Administered 2019-07-11 – 2019-07-14 (×2): 1 mg via ORAL
  Filled 2019-07-10 (×2): qty 1

## 2019-07-10 MED ORDER — HALOPERIDOL LACTATE 5 MG/ML IJ SOLN
5.0000 mg | Freq: Once | INTRAMUSCULAR | Status: AC | PRN
  Administered 2019-07-10: 5 mg via INTRAMUSCULAR
  Filled 2019-07-10: qty 1

## 2019-07-10 NOTE — Progress Notes (Signed)
   07/09/19 2013  Provider Notification  Provider Name/Title Bodenheimer NP  Date Provider Notified 07/09/19  Time Provider Notified 2013  Notification Type Page  Notification Reason Change in status  Response See new orders  Date of Provider Response 07/09/19   Patient is climbing out of bed and insisting that she is going home. When asked how she is going to get there, she states that she can walk.  She insists that staff is lying to her.  She states she is not sick.  While attempting to explain to patient why she is in the hospital, she keeps attempting to get out of the room around the staff.  Multiple attempts to call multiple listed family members to see if they could reorient patient but no answer.  VM left on Pamela's VM asking her to call this RN with no call back.  Bodenheimer NP made aware.  Order received for restraints and IV Haldol.  By that time, patient agreed to sit in recliner and come to the front nurses' desk.  Patient currently cooperative.  Will continue to monitor.  Will not give Haldol and initiate restraints at this time.  Earleen Reaper RN

## 2019-07-10 NOTE — Progress Notes (Signed)
[place] TEAM 1 - Stepdown/ICU TEAM  DELRAE HAGEY  PXT:[id number] DOB: 1941/01/10 DOA: 07/08/2019 PCP: Birdie Sons, MD    Brief Narrative:  79 year old with a history of hypothyroidism, HTN, HLD, DM 2, and chronic kidney disease stage III-IV who was brought to the ED after her daughter found her gurgling and unresponsive in her bed.  She had a right-sided facial droop and apparent right arm weakness.  History also revealed apparent shortness of breath and vomiting at home.  In the ED the patient herself is altered and unable to provide a reliable history though she was awake.  At initial presentation the patient was a code stroke but this was canceled after neurology evaluated the patient.  Most of her focal neurologic symptoms had resolved at the time of arrival in the ED.  CT of the head revealed a questionable hypodensity in the right paramedian pons suggestive of ischemia.  Of note the patient was undergoing an outpatient work-up for a lung mass accompanied by unintentional weight loss and poor appetite.  She had a PET scan 4/21 which was notable for a left-sided hypermetabolic mass suggestive of a malignancy, with a similar focus in the right lung.  Significant Events: 4/22 admit via [facility] w/ AMS  4/23 EEG -moderate diffuse encephalopathy with no seizure or epileptiform discharges seen  Subjective: Became confused and agitated this morning wanting to leave the hospital.  Mental status is waxing and waning with patient displaying very poor appreciation of her own limitations and impulsiveness.  She denies any complaints whatsoever.  She does not appear to be cognizant of her own confusion.  She does not appear to be in acute distress from a respiratory standpoint.  Assessment & Plan:  Altered mental status -unclear etiology CT head abnormal but not definitive - MRI/A w/o acute findings -neurology has evaluated -B12 is normal -TSH is normal -ammonia not elevated -folate normal -no  other symptoms to suggest an active urinary tract infection - perhaps this is progressive dementia +/- sundowning - will discuss w/ family   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 [name] oncology - based upon prior plan patient will need biopsy given positive PET scan results -given the patient is anxious to be discharged and that she is already connected with oncology will allow her to follow-up on this as an outpatient - given her present cognitive challenges I am not sure she will be a candidate for aggressive tx  Leukocytosis Likely simple demargination/reactive -no evidence of focal infection at present -resolved   Chronic kidney disease stage III-IV Baseline creatinine 1.8-2 -creatinine at baseline  DM 2 CBG controlled presently -CBG controlled  Hypothyroidism Continue home Synthroid dose  DVT prophylaxis: Lovenox Code Status: FULL CODE Family Communication:  Disposition Plan: she was living in an independent home in [place] with family prior to admission -cognitive deficits are currently the reason she is not yet able to be discharged safely  Consultants:  Neurology  Antimicrobials:  None  Objective: Blood pressure (!) 187/59, pulse 75, temperature 99 F (37.2 C), temperature source Oral, resp. rate 18, weight 36.8 kg, SpO2 100 %.  Intake/Output Summary (Last 24 hours) at 07/10/2019 0954 Last data filed at 07/10/2019 0800 Gross per 24 hour  Intake 900 ml  Output 0 ml  Net 900 ml   Filed Weights   07/08/19 1407 07/08/19 2144  Weight: 37.1 kg 36.8 kg    Examination: General: No  acute respiratory distress Lungs: CTA B without wheezing Cardiovascular: RRR without murmur Abdomen: Nontender, nondistended, soft, bowel sounds positive, no rebound, no ascites, no appreciable mass Extremities: No C/C/E B LE   CBC: Recent Labs  Lab 07/08/19 0200 07/08/19 0206 07/09/19 0359  WBC 14.3*  --  8.2  NEUTROABS 9.9*  --   --     HGB 11.6* 12.6 10.7*  HCT 37.6 37.0 33.6*  MCV 80.0  --  78.3*  PLT 198  --  366   Basic Metabolic Panel: Recent Labs  Lab 07/08/19 0200 07/08/19 0206 07/09/19 0359  NA 139 140 142  K 4.2 3.8 4.7  CL 105 105 109  CO2 21*  --  25  GLUCOSE 145* 149* 121*  BUN 22 23 19   CREATININE 1.78* 1.60* 1.67*  CALCIUM 9.0  --  9.0   GFR: CrCl cannot be calculated (Unknown ideal weight.).  Liver Function Tests: Recent Labs  Lab 07/08/19 0200  AST 17  ALT 8  ALKPHOS 76  BILITOT 0.9  PROT 6.1*  ALBUMIN 2.9*    Recent Labs  Lab 07/09/19 0359  AMMONIA 20    Coagulation Profile: Recent Labs  Lab 07/08/19 0200  INR 1.1   HbA1C: Hemoglobin A1C  Date/Time Value Ref Range Status  08/05/2018 12:00 AM 7.7  Final  12/25/2016 12:00 AM 8.1  Final   Hgb A1c MFr Bld  Date/Time Value Ref Range Status  07/08/2019 02:00 AM 7.9 (H) 4.8 - 5.6 % Final    Comment:    (NOTE) Pre diabetes:          5.7%-6.4% Diabetes:              >6.4% Glycemic control for   <7.0% adults with diabetes   05/31/2019 10:59 AM 7.2 (H) 4.8 - 5.6 % Final    Comment:             Prediabetes: 5.7 - 6.4          Diabetes: >6.4          Glycemic control for adults with diabetes: <7.0     CBG: Recent Labs  Lab 07/09/19 0638 07/09/19 1117 07/09/19 1632 07/09/19 2118 07/10/19 0639  GLUCAP 124* 115* 179* 165* 164*    Recent Results (from the past 240 hour(s))  Respiratory Panel by RT PCR (Flu A&B, Covid) - Nasopharyngeal Swab     Status: None   Collection Time: 07/08/19  3:13 AM   Specimen: Nasopharyngeal Swab  Result Value Ref Range Status   SARS Coronavirus 2 by RT PCR NEGATIVE NEGATIVE Final    Comment: (NOTE) SARS-CoV-2 target nucleic acids are NOT DETECTED. The SARS-CoV-2 RNA is generally detectable in upper respiratoy specimens during the acute phase of infection. The lowest concentration of SARS-CoV-2 viral copies this assay can detect is 131 copies/mL. A negative result does not  preclude SARS-Cov-2 infection and should not be used as the sole basis for treatment or other patient management decisions. A negative result may occur with  improper specimen collection/handling, submission of specimen other than nasopharyngeal swab, presence of viral mutation(s) within the areas targeted by this assay, and inadequate number of viral copies (<131 copies/mL). A negative result must be combined with clinical observations, patient history, and epidemiological information. The expected result is Negative. Fact Sheet for Patients:  PinkCheek.be Fact Sheet for Healthcare Providers:  GravelBags.it This test is not yet ap proved or cleared by the [place] FDA and  has been authorized for detection  and/or diagnosis of SARS-CoV-2 by FDA under an Emergency Use Authorization (EUA). This EUA will remain  in effect (meaning this test can be used) for the duration of the COVID-19 declaration under Section 564(b)(1) of the Act, 21 U.S.C. section 360bbb-3(b)(1), unless the authorization is terminated or revoked sooner.    Influenza A by PCR NEGATIVE NEGATIVE Final   Influenza B by PCR NEGATIVE NEGATIVE Final    Comment: (NOTE) The Xpert Xpress SARS-CoV-2/FLU/RSV assay is intended as an aid in  the diagnosis of influenza from Nasopharyngeal swab specimens and  should not be used as a sole basis for treatment. Nasal washings and  aspirates are unacceptable for Xpert Xpress SARS-CoV-2/FLU/RSV  testing. Fact Sheet for Patients: PinkCheek.be Fact Sheet for Healthcare Providers: GravelBags.it This test is not yet approved or cleared by the [place] FDA and  has been authorized for detection and/or diagnosis of SARS-CoV-2 by  FDA under an Emergency Use Authorization (EUA). This EUA will remain  in effect (meaning this test can be used) for the duration of the    Covid-19 declaration under Section 564(b)(1) of the Act, 21  U.S.C. section 360bbb-3(b)(1), unless the authorization is  terminated or revoked. Performed at [facility], [place] [address], [place], [place] [address]      Scheduled Meds: . donepezil  10 mg Oral QHS  . enoxaparin (LOVENOX) injection  30 mg Subcutaneous Q24H  . feeding supplement (ENSURE ENLIVE)  237 mL Oral BID BM  . insulin aspart  0-9 Units Subcutaneous TID AC & HS  . levothyroxine  50 mcg Oral Daily  . multivitamin with minerals  1 tablet Oral Daily     LOS: 2 days    Cherene Altes, MD [facility] Office  [phone] Pager - Text Page per Amion as per below:  On-Call/Text Page:      [email or web address]  If 7PM-7AM, please contact night-coverage [email or web address] 07/10/2019, 9:54 AM

## 2019-07-10 NOTE — Progress Notes (Signed)
   07/10/19 0118  Provider Notification  Provider Name/Title Bodenheimer NP  Date Provider Notified 07/10/19  Time Provider Notified 0118  Notification Type Page  Notification Reason Change in status (Agitated/Pulled IV out/Need IM Haldol)  Response See new orders   Patient pulled PIV out.  She is still refusing Telemetry.  She is at the front nurses' desk.  She again became agitated.  Getting out of her chair.  Wanting to know where her shoes are so that she can walk home.  She states we are lying to her.  She is cursing and stating this is a jail.  After about 10 minutes, we were able to get patient to agree to sit in her chair.  She is refusing to take her thyroid medication at this time.  She is also refusing to allow [place] to attempt to stick her for another PIV and labs.  She is drifting off to sleep.  Held from giving IV Haldol at this time.  Will continue to monitor patient for safety.  Earleen Reaper RN

## 2019-07-10 NOTE — Progress Notes (Signed)
   07/09/19 2301  Provider Notification  Provider Name/Title Bodenheimer NP  Date Provider Notified 07/09/19  Time Provider Notified 2301  Notification Type Page  Notification Reason Change in status  Response See new orders  Date of Provider Response 07/09/19   Patient is still at front nurses' desk.  She is currently relatively cooperative with staff.  She drank a coke and took her Aricept.  She asked why she was in the hospital.  She still is refusing her telemetry box.  Called Bodenheimer NP and made aware that restraints were not applied and Haldol not given.  He changed Haldol to prn.  He discontinued the restraint orders.  Will continue to monitor patient.  Earleen Reaper RN

## 2019-07-10 NOTE — Progress Notes (Signed)
   07/10/19 0011  Provider Notification  Provider Name/Title Bodenheimer NP   Date Provider Notified 07/10/19  Time Provider Notified 0011  Notification Type Page  Notification Reason Change in status (still refusing for telemetry box to be put back on.)   Patient still refusing for Telemetry box to be put back on.  Called and made Bodenheimer NP aware.  Will continue to monitor patient.  Earleen Reaper RN

## 2019-07-11 LAB — GLUCOSE, CAPILLARY
Glucose-Capillary: 104 mg/dL — ABNORMAL HIGH (ref 70–99)
Glucose-Capillary: 285 mg/dL — ABNORMAL HIGH (ref 70–99)
Glucose-Capillary: 293 mg/dL — ABNORMAL HIGH (ref 70–99)
Glucose-Capillary: 119 mg/dL — ABNORMAL HIGH (ref 70–99)

## 2019-07-11 MED ORDER — DOXAZOSIN MESYLATE 2 MG PO TABS
4.0000 mg | ORAL_TABLET | Freq: Every day | ORAL | Status: DC
  Administered 2019-07-11 – 2019-07-13 (×3): 4 mg via ORAL
  Filled 2019-07-11 (×3): qty 2

## 2019-07-11 MED ORDER — MIRTAZAPINE 15 MG PO TABS: 7.5000 mg | ORAL_TABLET | Freq: Every day | ORAL | Status: DC

## 2019-07-11 MED ORDER — INSULIN GLARGINE 100 UNIT/ML ~~LOC~~ SOLN
10.0000 [IU] | Freq: Every day | SUBCUTANEOUS | Status: DC
  Administered 2019-07-11 – 2019-07-13 (×3): 10 [IU] via SUBCUTANEOUS
  Filled 2019-07-11 (×4): qty 0.1

## 2019-07-11 MED ORDER — VITAMIN D3 25 MCG (1000 UNIT) PO TABS
5000.0000 [IU] | ORAL_TABLET | Freq: Every day | ORAL | Status: DC
  Administered 2019-07-11 – 2019-07-14 (×4): 5000 [IU] via ORAL
  Filled 2019-07-11 (×8): qty 5

## 2019-07-11 NOTE — Progress Notes (Signed)
[place] TEAM 1 - Stepdown/ICU TEAM  ANAIZA BEHRENS  CVE:[id number] DOB: Nov 08, 1940 DOA: 07/08/2019 PCP: Birdie Sons, MD    Brief Narrative:  79 year old with a history of hypothyroidism, HTN, HLD, DM 2, and chronic kidney disease stage III-IV who was brought to the ED after her daughter found her gurgling and unresponsive in her bed.  She had a right-sided facial droop and apparent right arm weakness.  History also revealed apparent shortness of breath and vomiting at home.  In the ED the patient herself was altered and unable to provide a reliable history though she was awake.  At initial presentation the patient was a code stroke but this was canceled after neurology evaluated the patient.  Most of her focal neurologic symptoms had resolved at the time of arrival in the ED.  CT of the head revealed a questionable hypodensity in the right paramedian pons suggestive of ischemia.  Of note the patient was undergoing an outpatient work-up for a lung mass accompanied by unintentional weight loss and poor appetite.  She had a PET scan 4/21 which was notable for a left-sided hypermetabolic mass suggestive of a malignancy, with a similar focus in the right lung.  Significant Events: 4/22 admit via [facility] w/ AMS  4/23 EEG - moderate diffuse encephalopathy with no seizure or epileptiform discharges seen  Subjective: The patient continues to be confused during the day and agitated in the evenings. Ativan appears to be helping with this though Haldol had no effect. At the time of my visit she is sitting at the nurses station but is calm and cooperative.  Assessment & Plan:  Altered mental status -unclear etiology CT head abnormal but not definitive - MRI/A w/o acute findings -neurology has evaluated -B12 is normal -TSH is normal -ammonia not elevated -folate normal -no other symptoms to suggest an active urinary tract infection - perhaps this is progressive dementia +/- sundowning -begin nightly  trial of Seroquel if patient will comply with dosing  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 [name] oncology - based upon prior plan patient will need biopsy given positive PET scan results -given her present cognitive challenges I am not sure she will be a candidate for aggressive tx  Leukocytosis Likely simple demargination/reactive -no evidence of focal infection at present -resolved   Chronic kidney disease stage III-IV Baseline creatinine 1.8-2 -creatinine presently better than her reported baseline  DM 2 CBG above goal -adjust treatment regimen and follow  Hypothyroidism Continue home Synthroid dose  DVT prophylaxis: Lovenox Code Status: FULL CODE Family Communication: I called the number listed as the patient's contact and there was no answer Disposition Plan: she was living in an independent home in [place] with family prior to admission -cognitive deficits are currently the reason she is not yet able to be discharged safely  Consultants:  Neurology  Antimicrobials:  None  Objective: Blood pressure (!) 191/36, pulse (!) 56, temperature 99.2 F (37.3 C), temperature source Oral, resp. rate 16, weight 36.8 kg, SpO2 97 %.  Intake/Output Summary (Last 24 hours) at 07/11/2019 0925 Last data filed at 07/11/2019 0600 Gross per 24 hour  Intake 360 ml  Output 0 ml  Net 360 ml   Filed Weights   07/08/19 1407 07/08/19 2144  Weight: 37.1 kg 36.8 kg    Examination: General: No acute respiratory distress -alert and pleasant at present but confused Lungs: CTA B -no wheezing Cardiovascular: RRR -no  murmur Abdomen: NT/ND, soft, BS positive Extremities: No C/C/E B lower extremities   CBC: Recent Labs  Lab 07/08/19 0200 07/08/19 0206 07/09/19 0359 07/10/19 1248  WBC 14.3*  --  8.2 8.1  NEUTROABS 9.9*  --   --   --   HGB 11.6* 12.6 10.7* 12.2  HCT 37.6 37.0 33.6* 38.5  MCV 80.0  --  78.3* 78.4*  PLT 198  --  204 517    Basic Metabolic Panel: Recent Labs  Lab 07/08/19 0200 07/08/19 0206 07/09/19 0359 07/10/19 1248  NA 139 140 142 139  K 4.2 3.8 4.7 4.5  CL 105 105 109 104  CO2 21*  --  25 24  GLUCOSE 145* 149* 121* 223*  BUN 22 23 19 16   CREATININE 1.78* 1.60* 1.67* 1.46*  CALCIUM 9.0  --  9.0 9.3   GFR: CrCl cannot be calculated (Unknown ideal weight.).  Liver Function Tests: Recent Labs  Lab 07/08/19 0200 07/10/19 1248  AST 17 21  ALT 8 12  ALKPHOS 76 81  BILITOT 0.9 0.9  PROT 6.1* 6.4*  ALBUMIN 2.9* 3.2*    Recent Labs  Lab 07/09/19 0359  AMMONIA 20    Coagulation Profile: Recent Labs  Lab 07/08/19 0200  INR 1.1   HbA1C: Hemoglobin A1C  Date/Time Value Ref Range Status  08/05/2018 12:00 AM 7.7  Final  12/25/2016 12:00 AM 8.1  Final   Hgb A1c MFr Bld  Date/Time Value Ref Range Status  07/08/2019 02:00 AM 7.9 (H) 4.8 - 5.6 % Final    Comment:    (NOTE) Pre diabetes:          5.7%-6.4% Diabetes:              >6.4% Glycemic control for   <7.0% adults with diabetes   05/31/2019 10:59 AM 7.2 (H) 4.8 - 5.6 % Final    Comment:             Prediabetes: 5.7 - 6.4          Diabetes: >6.4          Glycemic control for adults with diabetes: <7.0     CBG: Recent Labs  Lab 07/10/19 0639 07/10/19 1106 07/10/19 1720 07/10/19 2132 07/11/19 0636  GLUCAP 164* 205* 150* 210* 104*    Recent Results (from the past 240 hour(s))  Respiratory Panel by RT PCR (Flu A&B, Covid) - Nasopharyngeal Swab     Status: None   Collection Time: 07/08/19  3:13 AM   Specimen: Nasopharyngeal Swab  Result Value Ref Range Status   SARS Coronavirus 2 by RT PCR NEGATIVE NEGATIVE Final    Comment: (NOTE) SARS-CoV-2 target nucleic acids are NOT DETECTED. The SARS-CoV-2 RNA is generally detectable in upper respiratoy specimens during the acute phase of infection. The lowest concentration of SARS-CoV-2 viral copies this assay can detect is 131 copies/mL. A negative result does not  preclude SARS-Cov-2 infection and should not be used as the sole basis for treatment or other patient management decisions. A negative result may occur with  improper specimen collection/handling, submission of specimen other than nasopharyngeal swab, presence of viral mutation(s) within the areas targeted by this assay, and inadequate number of viral copies (<131 copies/mL). A negative result must be combined with clinical observations, patient history, and epidemiological information. The expected result is Negative. Fact Sheet for Patients:  PinkCheek.be Fact Sheet for Healthcare Providers:  GravelBags.it This test is not yet ap proved or cleared by the [place]  FDA and  has been authorized for detection and/or diagnosis of SARS-CoV-2 by FDA under an Emergency Use Authorization (EUA). This EUA will remain  in effect (meaning this test can be used) for the duration of the COVID-19 declaration under Section 564(b)(1) of the Act, 21 U.S.C. section 360bbb-3(b)(1), unless the authorization is terminated or revoked sooner.    Influenza A by PCR NEGATIVE NEGATIVE Final   Influenza B by PCR NEGATIVE NEGATIVE Final    Comment: (NOTE) The Xpert Xpress SARS-CoV-2/FLU/RSV assay is intended as an aid in  the diagnosis of influenza from Nasopharyngeal swab specimens and  should not be used as a sole basis for treatment. Nasal washings and  aspirates are unacceptable for Xpert Xpress SARS-CoV-2/FLU/RSV  testing. Fact Sheet for Patients: PinkCheek.be Fact Sheet for Healthcare Providers: GravelBags.it This test is not yet approved or cleared by the [place] FDA and  has been authorized for detection and/or diagnosis of SARS-CoV-2 by  FDA under an Emergency Use Authorization (EUA). This EUA will remain  in effect (meaning this test can be used) for the duration of the    Covid-19 declaration under Section 564(b)(1) of the Act, 21  U.S.C. section 360bbb-3(b)(1), unless the authorization is  terminated or revoked. Performed at [facility], [place] [address], [place], [place] [address]      Scheduled Meds: . donepezil  10 mg Oral QHS  . enoxaparin (LOVENOX) injection  30 mg Subcutaneous Q24H  . feeding supplement (ENSURE ENLIVE)  237 mL Oral BID BM  . insulin aspart  0-9 Units Subcutaneous TID AC & HS  . levothyroxine  50 mcg Oral Daily  . multivitamin with minerals  1 tablet Oral Daily  . QUEtiapine  25 mg Oral QHS     LOS: 3 days    Cherene Altes, MD [facility] Office  [phone] Pager - Text Page per Amion as per below:  On-Call/Text Page:      [email or web address]  If 7PM-7AM, please contact night-coverage [email or web address] 07/11/2019, 9:25 AM

## 2019-07-12 LAB — GLUCOSE, CAPILLARY
Glucose-Capillary: 97 mg/dL (ref 70–99)
Glucose-Capillary: 147 mg/dL — ABNORMAL HIGH (ref 70–99)
Glucose-Capillary: 216 mg/dL — ABNORMAL HIGH (ref 70–99)
Glucose-Capillary: 221 mg/dL — ABNORMAL HIGH (ref 70–99)

## 2019-07-12 NOTE — Progress Notes (Signed)
Physical Therapy Treatment Patient Details Name: Caitlyn Marshall MRN: [id number] DOB: 04-Nov-1940 Today's Date: 07/12/2019    History of Present Illness  Caitlyn Marshall is a 79 year old female with PMH hypothyroidism, HTN, hyperlipidemia, type 2 DM, CKD 3/4 who was brought to the ER from home after daughter found her gurgling and unresponsive in bed w/right-sided facial droop as well as right arm weakness and she was brought to the ER for further evaluation.The patient had also endorsed shortness of breath and vomiting recently at home.CT of the head revealed a questionable hypodensity in the right paramedian pons. MRA revelaed mild small vessel change of the hemispheric white matter. No pontine infarction as was questioned by CT. No additional acute findings    PT Comments    Continuing work on functional mobility and activity tolerance;  Session focused on progressive ambulation; Opted to walk without a RW today; Short, narrow steps, but no overt loss of balance   Follow Up Recommendations  Home health PT;Supervision/Assistance - 24 hour;Supervision - Intermittent     Equipment Recommendations  None recommended by PT    Recommendations for Other Services       Precautions / Restrictions Precautions Precautions: Fall    Mobility  Bed Mobility Overal bed mobility: Modified Independent             General bed mobility comments: Modified Independent with increased time  Transfers Overall transfer level: Needs assistance Equipment used: None Transfers: Sit to/from Stand Sit to Stand: Min guard         General transfer comment: min guard for safety, no physical assist needed  Ambulation/Gait Ambulation/Gait assistance: Min guard Gait Distance (Feet): 250 Feet Assistive device: None Gait Pattern/deviations: Decreased step length - right;Decreased step length - left;Narrow base of support Gait velocity: slightly dec   General Gait Details: Minguard throughout for safety;  Narrow, short steps; no overt loss of balance   Stairs             Wheelchair Mobility    Modified Rankin (Stroke Patients Only)       Balance             Standing balance-Leahy Scale: Fair                              Cognition Arousal/Alertness: Awake/alert Behavior During Therapy: WFL for tasks assessed/performed;Agitated Overall Cognitive Status: Impaired/Different from baseline Area of Impairment: Memory;Safety/judgement;Awareness                                      Exercises      General Comments        Pertinent Vitals/Pain Pain Assessment: Faces Faces Pain Scale: No hurt    Home Living                      Prior Function            PT Goals (current goals can now be found in the care plan section) Acute Rehab PT Goals Patient Stated Goal: to go home PT Goal Formulation: With patient Time For Goal Achievement: 07/23/19 Potential to Achieve Goals: Good Progress towards PT goals: Progressing toward goals    Frequency    Min 3X/week      PT Plan Current plan remains appropriate    Co-evaluation  AM-PAC PT "6 Clicks" Mobility   Outcome Measure  Help needed turning from your back to your side while in a flat bed without using bedrails?: None Help needed moving from lying on your back to sitting on the side of a flat bed without using bedrails?: None Help needed moving to and from a bed to a chair (including a wheelchair)?: A Little Help needed standing up from a chair using your arms (e.g., wheelchair or bedside chair)?: None Help needed to walk in hospital room?: A Little Help needed climbing 3-5 steps with a railing? : A Little 6 Click Score: 21    End of Session Equipment Utilized During Treatment: Gait belt Activity Tolerance: Patient tolerated treatment well Patient left: in chair;with call bell/phone within reach;with chair alarm set Nurse Communication: Mobility  status PT Visit Diagnosis: Unsteadiness on feet (R26.81);Other abnormalities of gait and mobility (R26.89)     Time: 8208-1388 PT Time Calculation (min) (ACUTE ONLY): 18 min  Charges:  $Gait Training: 8-22 mins                     Roney Marion, Kenneth Pager [phone] Office [place] 07/12/2019, 5:30 PM

## 2019-07-12 NOTE — Plan of Care (Signed)
  Problem: Safety: Goal: Ability to remain free from injury will improve Outcome: Progressing   

## 2019-07-12 NOTE — Progress Notes (Signed)
[facility] TEAM 1 - Stepdown/ICU TEAM  KINSEY KARCH  UYQ:[id number] DOB: [birth year] DOA: 07/08/2019 PCP: Birdie Sons, MD    Brief Narrative:  79 year old with a history of hypothyroidism, HTN, HLD, DM 2, and chronic kidney disease stage III-IV who was brought to the ED after her daughter found her gurgling and unresponsive in her bed.  She had a right-sided facial droop and apparent right arm weakness.  History also revealed apparent shortness of breath and vomiting at home.  In the ED the patient herself was altered and unable to provide a reliable history though she was awake.  At initial presentation the patient was a code stroke but this was canceled after neurology evaluated the patient.  Most of her focal neurologic symptoms had resolved at the time of arrival in the ED. CT of the head revealed a questionable hypodensity in the right paramedian pons suggestive of ischemia.  Of note the patient was undergoing an outpatient work-up for a lung mass accompanied by unintentional weight loss and poor appetite.  She had a PET scan 4/21 which was notable for a left-sided hypermetabolic mass suggestive of a malignancy, with a similar focus in the right lung.  Significant Events: 4/22 admit via [place] w/ AMS  4/23 EEG - moderate diffuse encephalopathy with no seizure or epileptiform discharges seen  Subjective: The patient appears to be stabilizing medically but remains confused and at times agitated.  She denies any complaints when I talked to her today but is pleasant and interactive.  She cannot tell me where she is or why she is here.  She cannot reliably tell me who lives at home with her or who helps look out for her.  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  CT head abnormal but not definitive - MRI/A w/o acute findings -neurology has evaluated -B12 is normal -TSH is normal -ammonia not elevated -folate normal -no other symptoms to  suggest an active urinary tract infection - perhaps this is progressive dementia +/- sundowning -continue nightly trial of Seroquel -I am having difficulty contacting family at the number listed in the chart -it appears at this time that the patient would be most appropriate for SNF placement though I suspect she will not agree with this and placement will be difficult -hopefully we will find a way to contact family soon and consider our options moving forward  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 [name] oncology - based upon prior plan patient will need biopsy given positive PET scan results -given her present cognitive challenges I am not sure she will be a candidate for aggressive tx - outpt f/u should be pursued should she stabilize from a cognitive standpoint   Leukocytosis Likely simple demargination/reactive -no evidence of focal infection at present -resolved   Chronic kidney disease stage III-IV Baseline creatinine 1.8-2 -creatinine presently better than her reported baseline  DM 2 CBG well controlled at this time  Hypothyroidism Continue home Synthroid dose  DVT prophylaxis: Lovenox Code Status: FULL CODE Family Communication: I called the number listed as the patient's contact again today and there was no answer -will enlist TOC assistance Disposition Plan: she was living in an independent home in [place] with family prior to admission -cognitive deficits are currently the reason she is not yet able to be discharged safely -I suspect SNF placement will be indicated -when family can be contacted goals  of care will also need to be addressed  Consultants:  Neurology  Antimicrobials:  None  Objective: Blood pressure (!) 171/84, pulse 65, temperature 98.1 F (36.7 C), temperature source Oral, resp. rate 16, weight 36.8 kg, SpO2 98 %.  Intake/Output Summary (Last 24 hours) at 07/12/2019 0955 Last data filed at 07/12/2019 0800 Gross  per 24 hour  Intake 954 ml  Output 0 ml  Net 954 ml   Filed Weights   07/08/19 1407 07/08/19 2144  Weight: 37.1 kg 36.8 kg    Examination: General: No acute respiratory distress -alert, pleasant, confused Lungs: CTA B Cardiovascular: RRR -no murmur Abdomen: NT/ND, soft, BS positive Extremities: No edema bilateral lower extremities with   CBC: Recent Labs  Lab 07/08/19 0200 07/08/19 0206 07/09/19 0359 07/10/19 1248  WBC 14.3*  --  8.2 8.1  NEUTROABS 9.9*  --   --   --   HGB 11.6* 12.6 10.7* 12.2  HCT 37.6 37.0 33.6* 38.5  MCV 80.0  --  78.3* 78.4*  PLT 198  --  204 626   Basic Metabolic Panel: Recent Labs  Lab 07/08/19 0200 07/08/19 0206 07/09/19 0359 07/10/19 1248  NA 139 140 142 139  K 4.2 3.8 4.7 4.5  CL 105 105 109 104  CO2 21*  --  25 24  GLUCOSE 145* 149* 121* 223*  BUN 22 23 19 16   CREATININE 1.78* 1.60* 1.67* 1.46*  CALCIUM 9.0  --  9.0 9.3   GFR: CrCl cannot be calculated (Unknown ideal weight.).  Liver Function Tests: Recent Labs  Lab 07/08/19 0200 07/10/19 1248  AST 17 21  ALT 8 12  ALKPHOS 76 81  BILITOT 0.9 0.9  PROT 6.1* 6.4*  ALBUMIN 2.9* 3.2*    Recent Labs  Lab 07/09/19 0359  AMMONIA 20    Coagulation Profile: Recent Labs  Lab 07/08/19 0200  INR 1.1   HbA1C: Hemoglobin A1C  Date/Time Value Ref Range Status  08/05/2018 12:00 AM 7.7  Final  12/25/2016 12:00 AM 8.1  Final   Hgb A1c MFr Bld  Date/Time Value Ref Range Status  07/08/2019 02:00 AM 7.9 (H) 4.8 - 5.6 % Final    Comment:    (NOTE) Pre diabetes:          5.7%-6.4% Diabetes:              >6.4% Glycemic control for   <7.0% adults with diabetes   05/31/2019 10:59 AM 7.2 (H) 4.8 - 5.6 % Final    Comment:             Prediabetes: 5.7 - 6.4          Diabetes: >6.4          Glycemic control for adults with diabetes: <7.0     CBG: Recent Labs  Lab 07/11/19 0636 07/11/19 1148 07/11/19 1737 07/11/19 2142 07/12/19 0641  GLUCAP 104* 285* 293* 119* 97     Recent Results (from the past 240 hour(s))  Respiratory Panel by RT PCR (Flu A&B, Covid) - Nasopharyngeal Swab     Status: None   Collection Time: 07/08/19  3:13 AM   Specimen: Nasopharyngeal Swab  Result Value Ref Range Status   SARS Coronavirus 2 by RT PCR NEGATIVE NEGATIVE Final    Comment: (NOTE) SARS-CoV-2 target nucleic acids are NOT DETECTED. The SARS-CoV-2 RNA is generally detectable in upper respiratoy specimens during the acute phase of infection. The lowest concentration of SARS-CoV-2 viral copies this assay can detect is  131 copies/mL. A negative result does not preclude SARS-Cov-2 infection and should not be used as the sole basis for treatment or other patient management decisions. A negative result may occur with  improper specimen collection/handling, submission of specimen other than nasopharyngeal swab, presence of viral mutation(s) within the areas targeted by this assay, and inadequate number of viral copies (<131 copies/mL). A negative result must be combined with clinical observations, patient history, and epidemiological information. The expected result is Negative. Fact Sheet for Patients:  PinkCheek.be Fact Sheet for Healthcare Providers:  GravelBags.it This test is not yet ap proved or cleared by the [place] FDA and  has been authorized for detection and/or diagnosis of SARS-CoV-2 by FDA under an Emergency Use Authorization (EUA). This EUA will remain  in effect (meaning this test can be used) for the duration of the COVID-19 declaration under Section 564(b)(1) of the Act, 21 U.S.C. section 360bbb-3(b)(1), unless the authorization is terminated or revoked sooner.    Influenza A by PCR NEGATIVE NEGATIVE Final   Influenza B by PCR NEGATIVE NEGATIVE Final    Comment: (NOTE) The Xpert Xpress SARS-CoV-2/FLU/RSV assay is intended as an aid in  the diagnosis of influenza from Nasopharyngeal  swab specimens and  should not be used as a sole basis for treatment. Nasal washings and  aspirates are unacceptable for Xpert Xpress SARS-CoV-2/FLU/RSV  testing. Fact Sheet for Patients: PinkCheek.be Fact Sheet for Healthcare Providers: GravelBags.it This test is not yet approved or cleared by the [place] FDA and  has been authorized for detection and/or diagnosis of SARS-CoV-2 by  FDA under an Emergency Use Authorization (EUA). This EUA will remain  in effect (meaning this test can be used) for the duration of the  Covid-19 declaration under Section 564(b)(1) of the Act, 21  U.S.C. section 360bbb-3(b)(1), unless the authorization is  terminated or revoked. Performed at [facility], [place] [address] [place], [place] [address]      Scheduled Meds: . cholecalciferol  5,000 Units Oral Daily  . donepezil  10 mg Oral QHS  . doxazosin  4 mg Oral QHS  . enoxaparin (LOVENOX) injection  30 mg Subcutaneous Q24H  . feeding supplement (ENSURE ENLIVE)  237 mL Oral BID BM  . insulin aspart  0-9 Units Subcutaneous TID AC & HS  . insulin glargine  10 Units Subcutaneous QHS  . levothyroxine  50 mcg Oral Daily  . multivitamin with minerals  1 tablet Oral Daily  . QUEtiapine  25 mg Oral QHS     LOS: 4 days    Cherene Altes, MD [facility] Office  [phone] Pager - Text Page per Amion as per below:  On-Call/Text Page:      [email or web address]  If 7PM-7AM, please contact night-coverage [email or web address] 07/12/2019, 9:55 AM

## 2019-07-12 NOTE — Progress Notes (Signed)
Patient's daughter name is Lind Covert number is [phone].

## 2019-07-13 LAB — BASIC METABOLIC PANEL
Anion gap: 8 (ref 5–15)
BUN: 19 mg/dL (ref 8–23)
CO2: 27 mmol/L (ref 22–32)
Calcium: 9.3 mg/dL (ref 8.9–10.3)
Chloride: 107 mmol/L (ref 98–111)
Creatinine, Ser: 1.66 mg/dL — ABNORMAL HIGH (ref 0.44–1.00)
GFR calc Af Amer: 34 mL/min — ABNORMAL LOW (ref >60)
GFR calc non Af Amer: 29 mL/min — ABNORMAL LOW (ref >60)
Glucose, Bld: 116 mg/dL — ABNORMAL HIGH (ref 70–99)
Potassium: 4.5 mmol/L (ref 3.5–5.1)
Sodium: 142 mmol/L (ref 135–145)

## 2019-07-13 LAB — GLUCOSE, CAPILLARY
Glucose-Capillary: 140 mg/dL — ABNORMAL HIGH (ref 70–99)
Glucose-Capillary: 68 mg/dL — ABNORMAL LOW (ref 70–99)
Glucose-Capillary: 121 mg/dL — ABNORMAL HIGH (ref 70–99)
Glucose-Capillary: 223 mg/dL — ABNORMAL HIGH (ref 70–99)
Glucose-Capillary: 78 mg/dL (ref 70–99)

## 2019-07-13 MED ORDER — HYDROCHLOROTHIAZIDE 25 MG PO TABS: 12.5000 mg | ORAL_TABLET | Freq: Every day | ORAL | Status: DC

## 2019-07-13 MED ORDER — HYDROCHLOROTHIAZIDE 12.5 MG PO CAPS
12.5000 mg | ORAL_CAPSULE | Freq: Every day | ORAL | Status: DC
  Administered 2019-07-13 – 2019-07-14 (×2): 12.5 mg via ORAL
  Filled 2019-07-13 (×2): qty 1

## 2019-07-13 MED ORDER — ASPIRIN EC 81 MG PO TBEC
81.0000 mg | DELAYED_RELEASE_TABLET | Freq: Every day | ORAL | Status: DC
  Administered 2019-07-14: 81 mg via ORAL
  Filled 2019-07-13 (×2): qty 1

## 2019-07-13 MED ORDER — AMLODIPINE BESYLATE 10 MG PO TABS
10.0000 mg | ORAL_TABLET | Freq: Every day | ORAL | Status: DC
  Administered 2019-07-13 – 2019-07-14 (×2): 10 mg via ORAL
  Filled 2019-07-13 (×2): qty 1

## 2019-07-13 MED ORDER — PRAVASTATIN SODIUM 10 MG PO TABS
10.0000 mg | ORAL_TABLET | Freq: Every day | ORAL | Status: DC
  Administered 2019-07-13: 10 mg via ORAL
  Filled 2019-07-13: qty 1

## 2019-07-13 MED ORDER — ONDANSETRON 4 MG PO TBDP
4.0000 mg | ORAL_TABLET | Freq: Three times a day (TID) | ORAL | Status: DC | PRN
  Administered 2019-07-13: 4 mg via ORAL
  Filled 2019-07-13: qty 1

## 2019-07-13 MED ORDER — LABETALOL HCL 100 MG PO TABS
100.0000 mg | ORAL_TABLET | Freq: Once | ORAL | Status: AC
  Administered 2019-07-13: 100 mg via ORAL
  Filled 2019-07-13: qty 1

## 2019-07-13 NOTE — Discharge Summary (Addendum)
Physician Discharge Summary  Caitlyn Marshall FGH:[id number] DOB: 21-Sep-1940 DOA: 07/08/2019  PCP: Birdie Sons, MD  Admit date: 07/08/2019 Discharge date: 07/14/2019  Admitted From: Home Disposition: Home  Recommendations for Outpatient Follow-up:  1. Follow up with PCP in 1-2 weeks 2. Please obtain BMP/CBC in one week 3. Please follow up on the following pending results:  Home Health: Yes Equipment/Devices: None  Discharge Condition: Stable CODE STATUS: Full code Diet recommendation: Cardiac  Subjective: Patient seen and examined.  She was alert and oriented to person but not place or time.  She had no complaints.  Brief/Interim Summary: 79 year old with a history of hypothyroidism, HTN, HLD, DM 2, and chronic kidney disease stage III-IV who was brought to the ED after her daughter found her gurgling and unresponsive in her bed.  She had a right-sided facial droop and apparent right arm weakness.  History also revealed apparent shortness of breath and vomiting at home.  Due to concern of a stroke, she was brought into the ED by her daughter. In the ED the patient herself was altered and unable to provide a reliable history though she was awake.  At initial presentation the patient was a code stroke but this was canceled after neurology evaluated the patient.  Most of her focal neurologic symptoms had resolved at the time of arrival in the ED. CT of the head revealed a questionable hypodensity in the right paramedian pons suggestive of ischemia.  Subsequently she underwent MRI of the brain as well as MR angiogram of the head which ruled out any stroke or any occlusion.  Patient has remained confused intermittently.  B12 and TSH as well as ammonia and folate were all within normal range.  She had mild leukocytosis which was likely reactive and has resolved.  No signs of infection.  She has CKD stage IIIb which has been at baseline as well.  Initially, previous hospitalist had a hard time  connecting to the family.  I assumed patient's care today.  I was able to connect with patient's daughter Lind Covert who lives in [place] and also happens to be patient's healthcare power of attorney.  According to her, patient has been dealing with advanced dementia and based on the information I gathered from the daughter, patient seems to be at her baseline.  Patient has been evaluated by PT OT and they have recommended home health with 24-hour supervision.  Pulmonary told me that [name] has son lives with the patient and has 24-hour assistance at home and that she will prefer to take her mom home with home health instead of sending her to SNF and she is okay with patient coming home today.  Based on this discussion, patient is being discharged in a stable condition.  She will resume all her home medications.  Home health has been ordered for her.  Patient's daughter will make arrangements for transportation.  Discharge Diagnoses:  Active Problems:   Altered mental status    Discharge Instructions   Allergies as of 07/13/2019   No Known Allergies     Medication List    TAKE these medications   allopurinol 100 MG tablet Commonly known as: ZYLOPRIM TAKE 2 TABLETS(200 MG) BY MOUTH TWICE DAILY   ALPRAZolam 0.25 MG tablet Commonly known as: XANAX Take 1 tablet (0.25 mg total) by mouth every 6 (six) hours as needed. What changed: reasons to take this   amLODipine 10 MG tablet Commonly known as: NORVASC Take 10 mg by mouth daily.  ascorbic acid 500 MG tablet Commonly known as: VITAMIN C Take 500 mg by mouth daily.   aspirin EC 81 MG tablet Take 81 mg by mouth daily.   cyproheptadine 4 MG tablet Commonly known as: PERIACTIN Start 1/2 tablet three times daily for one week, then increase to 1 tablet three times daily What changed:   how much to take  how to take this  when to take this   donepezil 10 MG tablet Commonly known as: Aricept Take 1 tablet (10 mg total)  by mouth at bedtime.   doxazosin 4 MG tablet Commonly known as: CARDURA Take 4 mg by mouth at bedtime.   hydrochlorothiazide 12.5 MG tablet Commonly known as: HYDRODIURIL TK 1 T PO QAM UTD FOR LEG SWELLING   insulin regular 100 units/mL injection Commonly known as: NOVOLIN R Inject 6-10 Units into the skin See admin instructions. Inject 6 units subcutaneously with breakfast, 8 units with lunch and 10 units with supper   INSULIN SYRINGE .5CC/30GX5/16" 30G X 5/16" 0.5 ML Misc U QID UTD   irbesartan 300 MG tablet Commonly known as: AVAPRO Take 300 mg by mouth every evening.   levothyroxine 50 MCG tablet Commonly known as: SYNTHROID Take 1 tablet by mouth daily.   lovastatin 20 MG tablet Commonly known as: MEVACOR TAKE 1 TABLET BY MOUTH DAILY   megestrol 20 MG tablet Commonly known as: MEGACE Take 1 tablet (20 mg total) by mouth daily.   mirtazapine 7.5 MG tablet Commonly known as: REMERON Take 7.5 mg by mouth at bedtime.   montelukast 10 MG tablet Commonly known as: SINGULAIR TAKE 1 TABLET BY MOUTH EVERY DAY What changed: when to take this   NovoLIN N 100 UNIT/ML injection Generic drug: insulin NPH Human Inject 6-7 Units into the skin See admin instructions. Inject 6 units subcutaneously before breakfast and 7 units before supper   ondansetron 4 MG tablet Commonly known as: ZOFRAN Take 4 mg by mouth every 8 (eight) hours as needed for nausea or vomiting.   ONE TOUCH ULTRA TEST test strip Generic drug: glucose blood TEST THREE TIMES DAILY   OneTouch Delica Lancets 72C Misc INJECT AS DIRECTED THREE TIMES DAILY   potassium chloride SA 20 MEQ tablet Commonly known as: KLOR-CON Take 1 tablet by mouth daily.   torsemide 20 MG tablet Commonly known as: DEMADEX Take 20 mg by mouth daily.   Vitamin D-3 125 MCG (5000 UT) Tabs Take 5,000 Units by mouth daily.      Follow-up Information    Birdie Sons, MD Follow up in 1 week(s).   Specialty: Family  Medicine Contact information: [address] [place] [address] [phone]          No Known Allergies  Consultations: None   Procedures/Studies: CT Abdomen Pelvis Wo Contrast  Result Date: 06/15/2019 CLINICAL DATA:  Follow-up lung nodule, abdominal pain EXAM: CT CHEST, ABDOMEN AND PELVIS WITHOUT CONTRAST TECHNIQUE: Multidetector CT imaging of the chest, abdomen and pelvis was performed following the standard protocol without IV contrast. COMPARISON:  Chest CT 2019, abdominal CT 2014 FINDINGS: CT CHEST FINDINGS Cardiovascular: Normal heart size. New moderate pericardial effusion. Coronary and aortic calcification. Mediastinum/Nodes: Possible enlarged node anterior to the carina measuring 2.5 cm. There is also increased subcarinal soft tissue density. Limited evaluation for hilar adenopathy due to lack of intravenous contrast. There are calcified left hilar lymph nodes. No axillary adenopathy. Lungs/Pleura: New small to moderate left pleural effusion and adjacent atelectasis. Masslike consolidation within  the lingula including the area of previously seen spiculated mass. Emphysema with bullous changes. Soft tissue density associated with cystic cavity of the right lower lobe again identified and is probably not neoplastic. Musculoskeletal: No aggressive osseous lesion. CT ABDOMEN PELVIS FINDINGS Hepatobiliary: Small hypoattenuating liver lesions were present on 2014 study and therefore benign. Gallbladder is unremarkable. No biliary dilatation. Pancreas: Atrophic and calcified likely reflecting sequelae of chronic pancreatitis. Spleen: Unremarkable. Adrenals/Urinary Tract: Bilateral nonobstructing renal calculi. Adrenals are unremarkable. Stomach/Bowel: Stomach is within normal limits. Bowel is normal in caliber. Appendix is not visualized. Vascular/Lymphatic: Marked aortic atherosclerosis. No adenopathy identified within limitation of noncontrast study. Reproductive: Grossly  unremarkable. Other: Nonspecific soft tissue thickening in the subcutaneous fat of the low ventral abdominal wall. Musculoskeletal: No aggressive osseous lesion. IMPRESSION: New small to moderate left pleural effusion with adjacent atelectasis. Increase in size of masslike consolidation within the lingula including the area of previously seen mass and likely reflecting progression of disease. New moderate pericardial effusion. Possible enlarged precarinal and subcarinal lymph nodes. Evaluation of the mediastinum and hila suboptimal on this noncontrast study. Chronic intra-abdominal findings detailed above. Electronically Signed   By: Macy Mis M.D.   On: 06/15/2019 16:53   DG Chest 2 View  Result Date: 06/25/2019 CLINICAL DATA:  Lung nodule EXAM: CHEST - 2 VIEW COMPARISON:  04/16/2017, chest CT 06/15/2019 FINDINGS: Left pleural effusion with left basilar atelectasis and lingular consolidation better evaluated on recent chest CT. Emphysema. Normal heart size. No acute osseous abnormality. IMPRESSION: Left pleural effusion with left basilar atelectasis. Lingular masslike consolidation better evaluated on recent CT. Electronically Signed   By: Macy Mis M.D.   On: 06/25/2019 11:53   CT Head Wo Contrast  Result Date: 06/25/2019 CLINICAL DATA:  Altered mental status EXAM: CT HEAD WITHOUT CONTRAST TECHNIQUE: Contiguous axial images were obtained from the base of the skull through the vertex without intravenous contrast. COMPARISON:  March 08, 2007 FINDINGS: Brain: No evidence of acute territorial infarction, hemorrhage, hydrocephalus,extra-axial collection or mass lesion/mass effect. There is dilatation the ventricles and sulci consistent with age-related atrophy. Low-attenuation changes in the deep white matter consistent with small vessel ischemia. Vascular: No hyperdense vessel or unexpected calcification. Skull: The skull is intact. No fracture or focal lesion identified. Sinuses/Orbits: The  visualized paranasal sinuses and mastoid air cells are clear. The orbits and globes intact. Other: None IMPRESSION: No acute intracranial abnormality. Findings consistent with age related atrophy and chronic small vessel ischemia Electronically Signed   By: Prudencio Pair M.D.   On: 06/25/2019 11:43   CT CHEST WO CONTRAST  Result Date: 06/15/2019 CLINICAL DATA:  Follow-up lung nodule, abdominal pain EXAM: CT CHEST, ABDOMEN AND PELVIS WITHOUT CONTRAST TECHNIQUE: Multidetector CT imaging of the chest, abdomen and pelvis was performed following the standard protocol without IV contrast. COMPARISON:  Chest CT 2019, abdominal CT 2014 FINDINGS: CT CHEST FINDINGS Cardiovascular: Normal heart size. New moderate pericardial effusion. Coronary and aortic calcification. Mediastinum/Nodes: Possible enlarged node anterior to the carina measuring 2.5 cm. There is also increased subcarinal soft tissue density. Limited evaluation for hilar adenopathy due to lack of intravenous contrast. There are calcified left hilar lymph nodes. No axillary adenopathy. Lungs/Pleura: New small to moderate left pleural effusion and adjacent atelectasis. Masslike consolidation within the lingula including the area of previously seen spiculated mass. Emphysema with bullous changes. Soft tissue density associated with cystic cavity of the right lower lobe again identified and is probably not neoplastic. Musculoskeletal: No aggressive osseous lesion. CT ABDOMEN  PELVIS FINDINGS Hepatobiliary: Small hypoattenuating liver lesions were present on 2014 study and therefore benign. Gallbladder is unremarkable. No biliary dilatation. Pancreas: Atrophic and calcified likely reflecting sequelae of chronic pancreatitis. Spleen: Unremarkable. Adrenals/Urinary Tract: Bilateral nonobstructing renal calculi. Adrenals are unremarkable. Stomach/Bowel: Stomach is within normal limits. Bowel is normal in caliber. Appendix is not visualized. Vascular/Lymphatic: Marked  aortic atherosclerosis. No adenopathy identified within limitation of noncontrast study. Reproductive: Grossly unremarkable. Other: Nonspecific soft tissue thickening in the subcutaneous fat of the low ventral abdominal wall. Musculoskeletal: No aggressive osseous lesion. IMPRESSION: New small to moderate left pleural effusion with adjacent atelectasis. Increase in size of masslike consolidation within the lingula including the area of previously seen mass and likely reflecting progression of disease. New moderate pericardial effusion. Possible enlarged precarinal and subcarinal lymph nodes. Evaluation of the mediastinum and hila suboptimal on this noncontrast study. Chronic intra-abdominal findings detailed above. Electronically Signed   By: Macy Mis M.D.   On: 06/15/2019 16:53   MR ANGIO HEAD WO CONTRAST  Result Date: 07/08/2019 CLINICAL DATA:  Found unresponsive in bed. Right-sided facial droop and right arm weakness. Right pontine infarction question by CT. EXAM: MRI HEAD WITHOUT AND WITH CONTRAST MRA HEAD WITHOUT CONTRAST TECHNIQUE: Multiplanar, multiecho pulse sequences of the brain and surrounding structures were obtained without and with intravenous contrast. Angiographic images of the head were obtained using MRA technique without contrast. CONTRAST:  39mL GADAVIST GADOBUTROL 1 MMOL/ML IV SOLN COMPARISON:  Head CT same day FINDINGS: MRI HEAD FINDINGS Brain: Diffusion imaging does not show any acute or subacute infarction. No brainstem insult. Cerebellum shows age related atrophy but no focal insult. Cerebral hemispheres show age related atrophy with only very minimal small vessel change of the white matter. No cortical or large vessel territory infarction. No mass lesion, hemorrhage, hydrocephalus or extra-axial collection. After contrast administration, no abnormal enhancement occurs. Vascular: Major vessels at the base of the brain show flow. Skull and upper cervical spine: Negative  Sinuses/Orbits: Minimal mucosal edematous changes. Small amount of fluid in the right mastoid tip. Orbits negative. Other: None MRA HEAD FINDINGS Both internal carotid arteries widely patent through the skull base and siphon regions. The anterior and middle cerebral vessels are patent without proximal stenosis, aneurysm or vascular malformation. More distal branch vessels do show some atherosclerotic irregularity. Dominant right vertebral artery widely patent to the basilar. The left vertebral artery is a small vessel, either developmental or secondary to acquired atherosclerosis. No basilar stenosis. Posterior circulation branch vessels are patent. Distal PCA branches show some atherosclerotic irregularity. IMPRESSION: No acute or reversible brain finding. Mild age related volume loss. Mild small vessel change of the hemispheric white matter. No pontine infarction as was questioned by CT. No explanation for the mental status changes. MR angiography does not show any acute finding. Mild intracranial atherosclerotic irregularity of the distal branch vessels. Small left vertebral artery could be congenital or due to acquired atherosclerosis. Electronically Signed   By: Nelson Chimes M.D.   On: 07/08/2019 09:58   MR BRAIN W WO CONTRAST  Result Date: 07/08/2019 CLINICAL DATA:  Found unresponsive in bed. Right-sided facial droop and right arm weakness. Right pontine infarction question by CT. EXAM: MRI HEAD WITHOUT AND WITH CONTRAST MRA HEAD WITHOUT CONTRAST TECHNIQUE: Multiplanar, multiecho pulse sequences of the brain and surrounding structures were obtained without and with intravenous contrast. Angiographic images of the head were obtained using MRA technique without contrast. CONTRAST:  51mL GADAVIST GADOBUTROL 1 MMOL/ML IV SOLN COMPARISON:  Head CT  same day FINDINGS: MRI HEAD FINDINGS Brain: Diffusion imaging does not show any acute or subacute infarction. No brainstem insult. Cerebellum shows age related atrophy  but no focal insult. Cerebral hemispheres show age related atrophy with only very minimal small vessel change of the white matter. No cortical or large vessel territory infarction. No mass lesion, hemorrhage, hydrocephalus or extra-axial collection. After contrast administration, no abnormal enhancement occurs. Vascular: Major vessels at the base of the brain show flow. Skull and upper cervical spine: Negative Sinuses/Orbits: Minimal mucosal edematous changes. Small amount of fluid in the right mastoid tip. Orbits negative. Other: None MRA HEAD FINDINGS Both internal carotid arteries widely patent through the skull base and siphon regions. The anterior and middle cerebral vessels are patent without proximal stenosis, aneurysm or vascular malformation. More distal branch vessels do show some atherosclerotic irregularity. Dominant right vertebral artery widely patent to the basilar. The left vertebral artery is a small vessel, either developmental or secondary to acquired atherosclerosis. No basilar stenosis. Posterior circulation branch vessels are patent. Distal PCA branches show some atherosclerotic irregularity. IMPRESSION: No acute or reversible brain finding. Mild age related volume loss. Mild small vessel change of the hemispheric white matter. No pontine infarction as was questioned by CT. No explanation for the mental status changes. MR angiography does not show any acute finding. Mild intracranial atherosclerotic irregularity of the distal branch vessels. Small left vertebral artery could be congenital or due to acquired atherosclerosis. Electronically Signed   By: Nelson Chimes M.D.   On: 07/08/2019 09:58   NM PET Image Initial (PI) Skull Base To Thigh  Result Date: 07/07/2019 CLINICAL DATA:  Initial treatment strategy for lung mass. EXAM: NUCLEAR MEDICINE PET SKULL BASE TO THIGH TECHNIQUE: 5.6 mCi F-18 FDG was injected intravenously. Full-ring PET imaging was performed from the skull base to thigh after  the radiotracer. CT data was obtained and used for attenuation correction and anatomic localization. Fasting blood glucose: 139 mg/dl COMPARISON:  Prior chest CTs most recent with the chest abdomen and pelvis study of June 15, 2019 FINDINGS: Mediastinal blood pool activity: SUV max 1.84 Liver activity: SUV max NA NECK: No hypermetabolic lymph nodes in the neck. Incidental CT findings: none CHEST: Left juxta hilar mass in the lingula distorting the fissure and inseparable from the left cardiac border and extending from the hilum into the left chest measuring approximately 4.8 x 3.8 cm (SUVmax = 7.4) quite similar to its appearance on the diagnostic CT obtained on 06/15/2019 Associated with hypermetabolic right paratracheal, precarinal, subcarinal and left hilar adenopathyw (Image 94, series 3): Hypermetabolic subcarinal lymph node 8 mm short axis (SUVmax = 6.0) Precarinal adenopathy similar size (SUVmax = 5.0) (image 86, series 3 Mildly hypermetabolic high right paratracheal lymph node (image 75, series 3) (SUVmax = 3.2 Left-sided pleural effusion with mild hypermetabolic activity at the periphery most areas corresponding to collapsed lung with metabolic activity slightly greater than blood pool. Mildly increased metabolic activity about the right hilum (image 102, series 3) (SUVmax = 2.8) 7 mm lymph node in this location. Background marked pulmonary emphysema with bullous changes in the right lower lobe. Along the margin of 1 of these areas of bullous disease is an area of nodularity that is unchanged in the short interval measuring approximately 2 x 1.9 cm (image 110, series 3) (SUVmax = 2.3) Incidental CT findings: calcified atheromatous plaque throughout the thoracic aorta. Mass abutting the left cardiac border. Small pericardial effusion. No chest wall lesion. No new finding since the previous  CT study. Airways are patent. Left pleural effusion with similar volume to slightly diminished volume. ABDOMEN/PELVIS:  Asymmetric renal activity with upper pole activity that extend slightly beyond the contour of the left kidney but without CT correlate. A similar appearance in the upper pole the right kidney where there is cortical scarring. No hypermetabolic areas in other solid organs. Changes of chronic calcific pancreatitis. No abdominal adenopathy. Crowding of bowel loops in the abdomen due to cachexia and limited intra-abdominal fat limiting assessment. Incidental CT findings: Low-density lesions in the liver likely small cysts. Limited visualization of the adrenal glands which are mildly nodular but not associated with increased FDG uptake beyond liver or blood pool. Numerous bilateral intrarenal calculi and likely parenchymal calculi in the kidneys. No hydronephrosis. No visible renal lesion. Limited assessment of bowel loops due to lack of mesenteric and intra-abdominal fat with similar appearance of visualized bowel accounting for lack of contrast on today's study is compared to the study of 06/15/2019. SKELETON: No focal hypermetabolic activity to suggest skeletal metastasis. Incidental CT findings: Spinal degenerative changes. IMPRESSION: 1. Hypermetabolic mass in the left chest with contralateral mediastinal adenopathy showing hypermetabolic features, suspicious for primary bronchogenic neoplasm. 2. Lesion distorts the fissure and is inseparable from the left heart border. 3. Left effusion without discrete marked hypermetabolic features. Collapsed lung along the anterior margin displaying slightly greater than blood pool activity. 4. Mild increased metabolism in right hilar lymph nodes and a focus of nodularity along bullous changes in the right lower lobe. Finding may represent a second primary or metastatic disease, increased in size since the study of May 01, 2017. Currently approximately 2 x 1.9 cm, area of concern along the anterolateral margin of this cystic and or bullous area previously measuring  approximately 15 x 11 mm in 2019. 5. Areas of activity not registering well with the renal profile on CT images likely associated with pooling of FDG within excreted urine in areas of patulous collecting systems or in dependent collecting systems. Dedicated renal imaging may be helpful for further assessment, ultrasound could be considered given small size of the patient as renal function may not allow for administration of intravenous contrast. 6. Changes of chronic calcific pancreatitis. Electronically Signed   By: Zetta Bills M.D.   On: 07/07/2019 16:12   DG Chest Port 1 View  Result Date: 07/08/2019 CLINICAL DATA:  Cough. Known lung mass. EXAM: PORTABLE CHEST 1 VIEW COMPARISON:  PET CT yesterday. Radiograph 06/25/2019. Chest CT 06/15/2019 FINDINGS: Left pleuroparenchymal opacity corresponds to lung mass and pleural effusion on PET CT yesterday. Emphysema. Bulla in the right lower lung zone with adjacent nodular consolidation again seen. Left hilar calcified nodes. No pneumothorax. Heart is normal in size. Pancreatic calcifications in the upper abdomen. IMPRESSION: 1. Left pleuroparenchymal opacity corresponds to lung mass and pleural effusion, characterized on PET CT yesterday. 2. Emphysema.  No acute findings by radiograph. Electronically Signed   By: Keith Rake M.D.   On: 07/08/2019 03:16   EEG adult  Result Date: 07/09/2019 Lora Havens, MD     07/09/2019  8:31 AM Patient Name: JENEAN ESCANDON MRN: [id number] Epilepsy Attending: Lora Havens Referring Physician/Provider: Dr. Amie [place] Date: 07/08/2019 Duration: 24.36 minutes Patient history: 79 year old female who presented with new onset right facial droop, right-sided weakness and altered mental status.  MRI did not show any acute stroke.  EEG evaluate for seizures. Level of alertness: Awake, asleep AEDs during EEG study: None Technical aspects: This EEG study was done  with scalp electrodes positioned according to the 10-20  International system of electrode placement. Electrical activity was acquired at a sampling rate of 500Hz  and reviewed with a high frequency filter of 70Hz  and a low frequency filter of 1Hz . EEG data were recorded continuously and digitally stored. Description: No clear posterior dominant rhythm was seen.  Sleep was characterized by vertex waves, maximal frontocentral region.  EEG showed initial was polymorphic 3 to 5 Hz theta-delta slowing. Hyperventilation and photic stimulation were not performed. Abnormality - Continuous slow, generalized IMPRESSION: This study is suggestive of moderate diffuse encephalopathy, nonspecific to etiology.  No seizures or epileptiform discharges were seen throughout the recording. Lora Havens   CT HEAD CODE STROKE WO CONTRAST  Result Date: 07/08/2019 CLINICAL DATA:  Code stroke. Initial evaluation for acute altered mental status, right-sided weakness. EXAM: CT HEAD WITHOUT CONTRAST TECHNIQUE: Contiguous axial images were obtained from the base of the skull through the vertex without intravenous contrast. COMPARISON:  None. FINDINGS: Brain: Generalized age-related cerebral atrophy with moderate chronic microischemic disease. No acute intracranial hemorrhage. There is question of a focal hypodensity involving the right paramedian pons, difficult to be certain as this area is prone artifact on CT. No other acute large vessel territory infarct. No mass lesion, midline shift or mass effect. No hydrocephalus or extra-axial fluid collection. Vascular: No hyperdense vessel. Calcified atherosclerosis present at skull base. Skull: Scalp soft tissues and calvarium within normal limits. Sinuses/Orbits: Globes orbital soft tissues demonstrate no acute finding. Paranasal sinuses are largely clear. Small chronic appearing right mastoid effusion noted. Left mastoid air cells are clear. Other: None. ASPECTS [facility] Stroke Program Early CT Score) - Ganglionic level infarction (caudate,  lentiform nuclei, internal capsule, insula, M1-M3 cortex): 7 - Supraganglionic infarction (M4-M6 cortex): 3 Total score (0-10 with 10 being normal): 10 IMPRESSION: 1. Question a focal hypodensity involving the right paramedian pons, which could reflect an evolving acute ischemic infarct. Difficult to be certain of this finding as this area is prone to artifact on CT. No intracranial hemorrhage. 2. ASPECTS is 10. 3. Age-related cerebral atrophy with chronic microvascular ischemic disease. These results were communicated to Dr. Cheral Marker at 2:25 [place] 4/22/2021by text page via the [facility] messaging system. Electronically Signed   By: Jeannine Boga M.D.   On: 07/08/2019 02:30      Discharge Exam: Vitals:   07/13/19 0447 07/13/19 0904  BP: (!) 186/52 (!) 154/41  Pulse: 77 64  Resp:  16  Temp: 98.8 F (37.1 C) 99.2 F (37.3 C)  SpO2: 97% 94%   Vitals:   07/12/19 1703 07/12/19 2114 07/13/19 0447 07/13/19 0904  BP: (!) 145/35 (!) 175/56 (!) 186/52 (!) 154/41  Pulse: 65 82 77 64  Resp: 20 17  16   Temp: 99.1 F (37.3 C) 99.1 F (37.3 C) 98.8 F (37.1 C) 99.2 F (37.3 C)  TempSrc: Oral Oral Oral Oral  SpO2: 98% 100% 97% 94%  Weight:        General: Pt is alert, awake, not in acute distress Cardiovascular: RRR, S1/S2 +, no rubs, no gallops Respiratory: CTA bilaterally, no wheezing, no rhonchi Abdominal: Soft, NT, ND, bowel sounds + Extremities: no edema, no cyanosis    The results of significant diagnostics from this hospitalization (including imaging, microbiology, ancillary and laboratory) are listed below for reference.     Microbiology: Recent Results (from the past 240 hour(s))  Respiratory Panel by RT PCR (Flu A&B, Covid) - Nasopharyngeal Swab     Status: None  Collection Time: 07/08/19  3:13 AM   Specimen: Nasopharyngeal Swab  Result Value Ref Range Status   SARS Coronavirus 2 by RT PCR NEGATIVE NEGATIVE Final    Comment: (NOTE) SARS-CoV-2 target nucleic acids are NOT  DETECTED. The SARS-CoV-2 RNA is generally detectable in upper respiratoy specimens during the acute phase of infection. The lowest concentration of SARS-CoV-2 viral copies this assay can detect is 131 copies/mL. A negative result does not preclude SARS-Cov-2 infection and should not be used as the sole basis for treatment or other patient management decisions. A negative result may occur with  improper specimen collection/handling, submission of specimen other than nasopharyngeal swab, presence of viral mutation(s) within the areas targeted by this assay, and inadequate number of viral copies (<131 copies/mL). A negative result must be combined with clinical observations, patient history, and epidemiological information. The expected result is Negative. Fact Sheet for Patients:  PinkCheek.be Fact Sheet for Healthcare Providers:  GravelBags.it This test is not yet ap proved or cleared by the [place] FDA and  has been authorized for detection and/or diagnosis of SARS-CoV-2 by FDA under an Emergency Use Authorization (EUA). This EUA will remain  in effect (meaning this test can be used) for the duration of the COVID-19 declaration under Section 564(b)(1) of the Act, 21 U.S.C. section 360bbb-3(b)(1), unless the authorization is terminated or revoked sooner.    Influenza A by PCR NEGATIVE NEGATIVE Final   Influenza B by PCR NEGATIVE NEGATIVE Final    Comment: (NOTE) The Xpert Xpress SARS-CoV-2/FLU/RSV assay is intended as an aid in  the diagnosis of influenza from Nasopharyngeal swab specimens and  should not be used as a sole basis for treatment. Nasal washings and  aspirates are unacceptable for Xpert Xpress SARS-CoV-2/FLU/RSV  testing. Fact Sheet for Patients: PinkCheek.be Fact Sheet for Healthcare Providers: GravelBags.it This test is not yet approved or cleared  by the [place] FDA and  has been authorized for detection and/or diagnosis of SARS-CoV-2 by  FDA under an Emergency Use Authorization (EUA). This EUA will remain  in effect (meaning this test can be used) for the duration of the  Covid-19 declaration under Section 564(b)(1) of the Act, 21  U.S.C. section 360bbb-3(b)(1), unless the authorization is  terminated or revoked. Performed at [facility], [place] [address] [place], [place] [address]      Labs: BNP (last 3 results) No results for input(s): BNP in the last 8760 hours. Basic Metabolic Panel: Recent Labs  Lab 07/08/19 0200 07/08/19 0206 07/09/19 0359 07/10/19 1248 07/13/19 0934  NA 139 140 142 139 142  K 4.2 3.8 4.7 4.5 4.5  CL 105 105 109 104 107  CO2 21*  --  25 24 27   GLUCOSE 145* 149* 121* 223* 116*  BUN 22 23 19 16 19   CREATININE 1.78* 1.60* 1.67* 1.46* 1.66*  CALCIUM 9.0  --  9.0 9.3 9.3   Liver Function Tests: Recent Labs  Lab 07/08/19 0200 07/10/19 1248  AST 17 21  ALT 8 12  ALKPHOS 76 81  BILITOT 0.9 0.9  PROT 6.1* 6.4*  ALBUMIN 2.9* 3.2*   No results for input(s): LIPASE, AMYLASE in the last 168 hours. Recent Labs  Lab 07/09/19 0359  AMMONIA 20   CBC: Recent Labs  Lab 07/08/19 0200 07/08/19 0206 07/09/19 0359 07/10/19 1248  WBC 14.3*  --  8.2 8.1  NEUTROABS 9.9*  --   --   --   HGB 11.6* 12.6 10.7* 12.2  HCT  37.6 37.0 33.6* 38.5  MCV 80.0  --  78.3* 78.4*  PLT 198  --  204 230   Cardiac Enzymes: No results for input(s): CKTOTAL, CKMB, CKMBINDEX, TROPONINI in the last 168 hours. BNP: Invalid input(s): POCBNP CBG: Recent Labs  Lab 07/12/19 1704 07/12/19 2108 07/13/19 0637 07/13/19 0724 07/13/19 1120  GLUCAP 216* 221* 68* 140* 121*   D-Dimer No results for input(s): DDIMER in the last 72 hours. Hgb A1c No results for input(s): HGBA1C in the last 72 hours. Lipid Profile No results for input(s): CHOL, HDL, LDLCALC, TRIG, CHOLHDL, LDLDIRECT in the last 72  hours. Thyroid function studies No results for input(s): TSH, T4TOTAL, T3FREE, THYROIDAB in the last 72 hours.  Invalid input(s): FREET3 Anemia work up No results for input(s): VITAMINB12, FOLATE, FERRITIN, TIBC, IRON, RETICCTPCT in the last 72 hours. Urinalysis    Component Value Date/Time   COLORURINE YELLOW 07/08/2019 1202   APPEARANCEUR HAZY (A) 07/08/2019 1202   APPEARANCEUR Cloudy 05/04/2013 2136   LABSPEC 1.011 07/08/2019 1202   LABSPEC 1.015 05/04/2013 2136   PHURINE 5.0 07/08/2019 1202   GLUCOSEU NEGATIVE 07/08/2019 1202   GLUCOSEU >=500 05/04/2013 2136   HGBUR NEGATIVE 07/08/2019 1202   BILIRUBINUR NEGATIVE 07/08/2019 1202   BILIRUBINUR Negative 05/04/2013 2136   KETONESUR NEGATIVE 07/08/2019 1202   PROTEINUR >=300 (A) 07/08/2019 1202   NITRITE NEGATIVE 07/08/2019 1202   LEUKOCYTESUR NEGATIVE 07/08/2019 1202   LEUKOCYTESUR 3+ 05/04/2013 2136   Sepsis Labs Invalid input(s): PROCALCITONIN,  WBC,  LACTICIDVEN Microbiology Recent Results (from the past 240 hour(s))  Respiratory Panel by RT PCR (Flu A&B, Covid) - Nasopharyngeal Swab     Status: None   Collection Time: 07/08/19  3:13 AM   Specimen: Nasopharyngeal Swab  Result Value Ref Range Status   SARS Coronavirus 2 by RT PCR NEGATIVE NEGATIVE Final    Comment: (NOTE) SARS-CoV-2 target nucleic acids are NOT DETECTED. The SARS-CoV-2 RNA is generally detectable in upper respiratoy specimens during the acute phase of infection. The lowest concentration of SARS-CoV-2 viral copies this assay can detect is 131 copies/mL. A negative result does not preclude SARS-Cov-2 infection and should not be used as the sole basis for treatment or other patient management decisions. A negative result may occur with  improper specimen collection/handling, submission of specimen other than nasopharyngeal swab, presence of viral mutation(s) within the areas targeted by this assay, and inadequate number of viral copies (<131 copies/mL).  A negative result must be combined with clinical observations, patient history, and epidemiological information. The expected result is Negative. Fact Sheet for Patients:  PinkCheek.be Fact Sheet for Healthcare Providers:  GravelBags.it This test is not yet ap proved or cleared by the [place] FDA and  has been authorized for detection and/or diagnosis of SARS-CoV-2 by FDA under an Emergency Use Authorization (EUA). This EUA will remain  in effect (meaning this test can be used) for the duration of the COVID-19 declaration under Section 564(b)(1) of the Act, 21 U.S.C. section 360bbb-3(b)(1), unless the authorization is terminated or revoked sooner.    Influenza A by PCR NEGATIVE NEGATIVE Final   Influenza B by PCR NEGATIVE NEGATIVE Final    Comment: (NOTE) The Xpert Xpress SARS-CoV-2/FLU/RSV assay is intended as an aid in  the diagnosis of influenza from Nasopharyngeal swab specimens and  should not be used as a sole basis for treatment. Nasal washings and  aspirates are unacceptable for Xpert Xpress SARS-CoV-2/FLU/RSV  testing. Fact Sheet for Patients: PinkCheek.be Fact Sheet for Healthcare  Providers: GravelBags.it This test is not yet approved or cleared by the [place] and  has been authorized for detection and/or diagnosis of SARS-CoV-2 by  FDA under an Emergency Use Authorization (EUA). This EUA will remain  in effect (meaning this test can be used) for the duration of the  Covid-19 declaration under Section 564(b)(1) of the Act, 21  U.S.C. section 360bbb-3(b)(1), unless the authorization is  terminated or revoked. Performed at [facility], [place] [address], [place], [place] [address]      Time coordinating discharge: Over 30 minutes  SIGNED:   Darliss Cheney, MD  [facility] 07/13/2019, 2:04 PM  If 7PM-7AM, please contact  night-coverage [email or web address]  Addendum 07/14/2019:  Patient was discharged yesterday as she was stable.  The decision was taken after having a long discussion with the daughter however after discharge orders were placed in summary was completed, daughter had informed the nursing staff that she was not able to arrange a ride for the patient and requested for the patient to stay overnight.  Patient was seen and examined by myself once again today.  There is no change in medical status from yesterday.  She is alert and oriented to self.  This is her baseline.  She is stable for discharge.

## 2019-07-13 NOTE — Progress Notes (Signed)
Occupational Therapy Treatment Patient Details Name: Caitlyn Marshall MRN: [id number] DOB: 09-28-1940 Today's Date: 07/13/2019    History of present illness  Caitlyn Marshall is a 79 year old female with PMH hypothyroidism, HTN, hyperlipidemia, type 2 DM, CKD 3/4 who was brought to the ER from home after daughter found her gurgling and unresponsive in bed w/right-sided facial droop as well as right arm weakness and she was brought to the ER for further evaluation.The patient had also endorsed shortness of breath and vomiting recently at home.CT of the head revealed a questionable hypodensity in the right paramedian pons. MRA revelaed mild small vessel change of the hemispheric white matter. No pontine infarction as was questioned by CT. No additional acute findings   OT comments  Pt making steady progress towards OT goals this session. Session focus on functional mobility and self- feeding tasks as precursor to higher level ADLs. Overall, pt requires MIN guard- MINA for functional mobility with no AD. Pt initially required hand over hand assist to begin self feeding, but able to complete task with supervision. Continue to recommend HHOT at time of DC to maximize functional independence. Will continue to follow acutely per POC.    Follow Up Recommendations  Home health OT;Supervision/Assistance - 24 hour(vs no OT follow-up if progresses well)    Equipment Recommendations  Other (comment)(TBD)    Recommendations for Other Services      Precautions / Restrictions Precautions Precautions: Fall Restrictions Weight Bearing Restrictions: No       Mobility Bed Mobility Overal bed mobility: Modified Independent             General bed mobility comments: Modified Independent with increased time  Transfers Overall transfer level: Needs assistance Equipment used: None Transfers: Sit to/from Stand;Stand Pivot Transfers Sit to Stand: Min guard Stand pivot transfers: Min assist        General transfer comment: MIN guard for sit<>stand; MIN A for stand pivot transfer for safety d/t initial unsteadiness    Balance Overall balance assessment: Needs assistance Sitting-balance support: No upper extremity supported;Feet supported Sitting balance-Leahy Scale: Good     Standing balance support: Single extremity supported Standing balance-Leahy Scale: Fair                             ADL either performed or assessed with clinical judgement   ADL Overall ADL's : Needs assistance/impaired Eating/Feeding: Supervision/ safety;Set up;Sitting Eating/Feeding Details (indicate cue type and reason): cues to initiate task                     Toilet Transfer: Ambulation;Minimal assistance Toilet Transfer Details (indicate cue type and reason): [place] for simulated toilet transfer to recliner with hand held assist         Functional mobility during ADLs: Minimal assistance General ADL Comments: pt continues to present wth decreased activity tolerance, generalized weakness and impaired balance limiting pts ability to participate in ADLs. session focus on functional mobility and seated self- feedign tasks     Vision       Perception     Praxis      Cognition Arousal/Alertness: Awake/alert Behavior During Therapy: WFL for tasks assessed/performed Overall Cognitive Status: No family/caregiver present to determine baseline cognitive functioning                                 General Comments: overall WFL; pt  very soft spoken and unable to state location of discomfort or what is making her feel "unwell"        Exercises     Shoulder Instructions       General Comments pt reports not feeling well but unable to state location of discomfort; BP checked at end of session with BP 152/53 checked again ~ 2 mins later 173/54; RN present to provide BP meds    Pertinent Vitals/ Pain       Pain Assessment: Faces Faces Pain Scale: Hurts a  little bit Pain Location: general malaise but unable to state location Pain Descriptors / Indicators: Discomfort;Grimacing Pain Intervention(s): Monitored during session  Home Living                                          Prior Functioning/Environment              Frequency  Min 3X/week        Progress Toward Goals  OT Goals(current goals can now be found in the care plan section)  Progress towards OT goals: Progressing toward goals  Acute Rehab OT Goals Patient Stated Goal: to go home OT Goal Formulation: With patient Time For Goal Achievement: 07/23/19 Potential to Achieve Goals: Good  Plan Discharge plan remains appropriate    Co-evaluation                 AM-PAC OT "6 Clicks" Daily Activity     Outcome Measure   Help from another person eating meals?: None Help from another person taking care of personal grooming?: A Little Help from another person toileting, which includes using toliet, bedpan, or urinal?: A Little Help from another person bathing (including washing, rinsing, drying)?: A Little Help from another person to put on and taking off regular upper body clothing?: A Little Help from another person to put on and taking off regular lower body clothing?: A Little 6 Click Score: 19    End of Session Equipment Utilized During Treatment: Gait belt;Rolling walker  OT Visit Diagnosis: Unsteadiness on feet (R26.81);Other abnormalities of gait and mobility (R26.89);Muscle weakness (generalized) (M62.81)   Activity Tolerance Patient tolerated treatment well   Patient Left in chair;with call bell/phone within reach;with chair alarm set   Nurse Communication Mobility status;Other (comment)(hypertension)        Time: 1607-3710 OT Time Calculation (min): 18 min  Charges: OT General Charges $OT Visit: 1 Visit OT Treatments $Self Care/Home Management : 8-22 mins  Lanier Clam., COTA/L Acute Rehabilitation  Services [phone] [place] 07/13/2019, 4:10 PM

## 2019-07-13 NOTE — Progress Notes (Signed)
On call, Sharlet Salina, NP made aware. PO labetalol 100mg  .ordered and given to patient.     07/13/19 0447  Vitals  BP (!) 186/52  MAP (mmHg) 92  BP Location Right Arm

## 2019-07-13 NOTE — Progress Notes (Signed)
CBG-68. 8oz of juice given to patient.

## 2019-07-13 NOTE — Progress Notes (Signed)
Inpatient Diabetes Program Recommendations  AACE/ADA: New Consensus Statement on Inpatient Glycemic Control (2015)  Target Ranges:  Prepandial:   less than 140 mg/dL      Peak postprandial:   less than 180 mg/dL (1-2 hours)      Critically ill patients:  140 - 180 mg/dL   Lab Results  Component Value Date   GLUCAP 140 (H) 07/13/2019   HGBA1C 7.9 (H) 07/08/2019    Review of Glycemic Control Results for Caitlyn Marshall, Caitlyn Marshall (MRN [id number]) as of 07/13/2019 10:18  Ref. Range 07/12/2019 17:04 07/12/2019 21:08 07/13/2019 06:37 07/13/2019 07:24  Glucose-Capillary Latest Ref Range: 70 - 99 mg/dL 216 (H) 221 (H) 68 (L) 140 (H)   Diabetes history: Type 2 DM Outpatient Diabetes medications: NPH 6 units QAM, 7 units QPM, Regular 08/23/08 Current orders for Inpatient glycemic control: Lantus 10 units QHS, Novolog 0-9 units TID & HS  Inpatient Diabetes Program Recommendations:    Noted hypoglycemia this Am of 68 mg/dL. Consider decreasing Lantus to 8 units QHS.   Thanks, Bronson Curb, MSN, RNC-OB Diabetes Coordinator [phone] (8a-5p)

## 2019-07-13 NOTE — Discharge Instructions (Signed)
Dementia Caregiver Guide Dementia is a term used to describe a number of symptoms that affect memory and thinking. The most common symptoms include:  Memory loss.  Trouble with language and communication.  Trouble concentrating.  Poor judgment.  Problems with reasoning.  Child-like behavior and language.  Extreme anxiety.  Angry outbursts.  Wandering from home or public places. Dementia usually gets worse slowly over time. In the early stages, people with dementia can stay independent and safe with some help. In later stages, they need help with daily tasks such as dressing, grooming, and using the bathroom. How to help the person with dementia cope Dementia can be frightening and confusing. Here are some tips to help the person with dementia cope with changes caused by the disease. General tips  Keep the person on track with his or her routine.  Try to identify areas where the person may need help.  Be supportive, patient, calm, and encouraging.  Gently remind the person that adjusting to changes takes time.  Help with the tasks that the person has asked for help with.  Keep the person involved in daily tasks and decisions as much as possible.  Encourage conversation, but try not to get frustrated or harried if the person struggles to find words or does not seem to appreciate your help. Communication tips  When the person is talking or seems frustrated, make eye contact and hold the person's hand.  Ask specific questions that need yes or no answers.  Use simple words, short sentences, and a calm voice. Only give one direction at a time.  When offering choices, limit them to just 1 or 2.  Avoid correcting the person in a negative way.  If the person is struggling to find the right words, gently try to help him or her. How to recognize symptoms of stress Symptoms of stress in caregivers include:  Feeling frustrated or angry with the person with  dementia.  Denying that the person has dementia or that his or her symptoms will not improve.  Feeling hopeless and unappreciated.  Difficulty sleeping.  Difficulty concentrating.  Feeling anxious, irritable, or depressed.  Developing stress-related health problems.  Feeling like you have too little time for your own life. Follow these instructions at home:   Make sure that you and the person you are caring for: ? Get regular sleep. ? Exercise regularly. ? Eat regular, nutritious meals. ? Drink enough fluid to keep your urine clear or pale yellow. ? Take over-the-counter and prescription medicines only as told by your health care providers. ? Attend all scheduled health care appointments.  Join a support group with others who are caregivers.  Ask about respite care resources so that you can have a regular break from the stress of caregiving.  Look for signs of stress in yourself and in the person you are caring for. If you notice signs of stress, take steps to manage it.  Consider any safety risks and take steps to avoid them.  Organize medications in a pill box for each day of the week.  Create a plan to handle any legal or financial matters. Get legal or financial advice if needed.  Keep a calendar in a central location to remind the person of appointments or other activities. Tips for reducing the risk of injury  Keep floors clear of clutter. Remove rugs, magazine racks, and floor lamps.  Keep hallways well lit, especially at night.  Put a handrail and nonslip mat in the bathtub or  shower.  Put childproof locks on cabinets that contain dangerous items, such as medicines, alcohol, guns, toxic cleaning items, sharp tools or utensils, matches, and lighters.  Put the locks in places where the person cannot see or reach them easily. This will help ensure that the person does not wander out of the house and get lost.  Be prepared for emergencies. Keep a list of  emergency phone numbers and addresses in a convenient area.  Remove car keys and lock garage doors so that the person does not try to get in the car and drive.  Have the person wear a bracelet that tracks locations and identifies the person as having memory problems. This should be worn at all times for safety. Where to find support: Many individuals and organizations offer support. These include:  Support groups for people with dementia and for caregivers.  Counselors or therapists.  Home health care services.  Adult day care centers. Where to find more information Alzheimer's Association: [employer].nz Contact a health care provider if:  The person's health is rapidly getting worse.  You are no longer able to care for the person.  Caring for the person is affecting your physical and emotional health.  The person threatens himself or herself, you, or anyone else. Summary  Dementia is a term used to describe a number of symptoms that affect memory and thinking.  Dementia usually gets worse slowly over time.  Take steps to reduce the person's risk of injury, and to plan for future care.  Caregivers need support, relief from caregiving, and time for their own lives. This information is not intended to replace advice given to you by your health care provider. Make sure you discuss any questions you have with your health care provider. Document Revised: 02/14/2017 Document Reviewed: 02/06/2016 Elsevier Patient Education  2020 [employer].

## 2019-07-13 NOTE — Progress Notes (Signed)
Occupational Therapy Treatment Patient Details Name: Caitlyn Marshall MRN: [id number] DOB: 1941-03-08 Today's Date: 07/13/2019    History of present illness  Caitlyn Marshall is a 79 year old female with PMH hypothyroidism, HTN, hyperlipidemia, type 2 DM, CKD 3/4 who was brought to the ER from home after daughter found her gurgling and unresponsive in bed w/right-sided facial droop as well as right arm weakness and she was brought to the ER for further evaluation.The patient had also endorsed shortness of breath and vomiting recently at home.CT of the head revealed a questionable hypodensity in the right paramedian pons. MRA revelaed mild small vessel change of the hemispheric white matter. No pontine infarction as was questioned by CT. No additional acute findings   OT comments  Pt making progress in therapy, demonstrating fair activity tolerance this date. Continued education with pt on safety strategies, activity pacing, and body positioning with RW to increase balance and prevent future falls with poor to fair understanding and follow through. Pt able to ambulate to/from bathroom with RW and min guard. 0 instances of LOB, however pt unsteady on feet. Pt completed toileting task requiring cues for safety and min assist for transfer. Pt required increased time to process and complete all tasks with repeat instructions. OT will continue to follow acutely. Continue to recommend [facility] OT for 24/7 assist following hospital discharge.    Follow Up Recommendations  Home health OT;Supervision/Assistance - 24 hour    Equipment Recommendations  Other (comment)(TBD)    Recommendations for Other Services      Precautions / Restrictions Precautions Precautions: Fall Restrictions Weight Bearing Restrictions: No       Mobility Bed Mobility Overal bed mobility: Modified Independent             General bed mobility comments: Pt seated in bedside chair upon OT arrival.   Transfers Overall transfer  level: Needs assistance Equipment used: Rolling walker (2 wheeled) Transfers: Sit to/from Stand Sit to Stand: Min guard;Min assist Stand pivot transfers: Min assist       General transfer comment: Min assist to power up into standing from low seat heights. Cues for hand placement and safety.     Balance Overall balance assessment: Needs assistance Sitting-balance support: No upper extremity supported;Feet supported Sitting balance-Leahy Scale: Good     Standing balance support: Single extremity supported Standing balance-Leahy Scale: Fair                             ADL either performed or assessed with clinical judgement   ADL Overall ADL's : Needs assistance/impaired Eating/Feeding: Supervision/ safety;Set up;Sitting Eating/Feeding Details (indicate cue type and reason): cues to initiate task Grooming: Wash/dry hands;Set up;Supervision/safety;Sitting                   Toilet Transfer: Minimal assistance;Ambulation;Regular Toilet;Grab bars Toilet Transfer Details (indicate cue type and reason): [place] for simulated toilet transfer to recliner with hand held assist [place] and Hygiene: Min guard;Sit to/from stand       Functional mobility during ADLs: Min guard;Rolling walker General ADL Comments: Pt able to ambulate to/from bathroom with RW and min guard. Pt required cues for safety and body positioning with RW. Increased time to complete and process instructions.      Vision       Perception     Praxis      Cognition Arousal/Alertness: Awake/alert Behavior During Therapy: WFL for tasks assessed/performed Overall Cognitive Status: No  family/caregiver present to determine baseline cognitive functioning Area of Impairment: Orientation;Memory;Following commands;Safety/judgement;Problem solving                 Orientation Level: Disoriented to;Place;Time;Situation   Memory: Decreased short-term memory Following  Commands: Follows one step commands inconsistently;Follows one step commands with increased time Safety/Judgement: Decreased awareness of safety;Decreased awareness of deficits   Problem Solving: Slow processing;Requires verbal cues;Requires tactile cues;Decreased initiation General Comments: Pt pleasant and willing to participate in therapy.         Exercises     Shoulder Instructions       General Comments No signs/symptoms of distress    Pertinent Vitals/ Pain       Pain Assessment: No/denies pain Faces Pain Scale: Hurts a little bit Pain Location: general malaise but unable to state location Pain Descriptors / Indicators: Discomfort;Grimacing Pain Intervention(s): Monitored during session  Home Living                                          Prior Functioning/Environment              Frequency  Min 3X/week        Progress Toward Goals  OT Goals(current goals can now be found in the care plan section)  Progress towards OT goals: Progressing toward goals  Acute Rehab OT Goals Patient Stated Goal: to go home OT Goal Formulation: With patient Time For Goal Achievement: 07/23/19 Potential to Achieve Goals: Good ADL Goals Pt Will Perform Grooming: with modified independence;standing Pt Will Perform Lower Body Bathing: with modified independence;sit to/from stand;sitting/lateral leans Pt Will Perform Lower Body Dressing: with modified independence;sitting/lateral leans;sit to/from stand Pt Will Transfer to Toilet: with supervision;ambulating;regular height toilet Pt Will Perform Toileting - Clothing Manipulation and hygiene: Independently;sit to/from stand;sitting/lateral leans  Plan Discharge plan remains appropriate    Co-evaluation                 AM-PAC OT "6 Clicks" Daily Activity     Outcome Measure   Help from another person eating meals?: None Help from another person taking care of personal grooming?: A Little Help from  another person toileting, which includes using toliet, bedpan, or urinal?: A Little Help from another person bathing (including washing, rinsing, drying)?: A Little Help from another person to put on and taking off regular upper body clothing?: A Little Help from another person to put on and taking off regular lower body clothing?: A Little 6 Click Score: 19    End of Session Equipment Utilized During Treatment: Gait belt;Rolling walker  OT Visit Diagnosis: Unsteadiness on feet (R26.81);Other abnormalities of gait and mobility (R26.89);Muscle weakness (generalized) (M62.81)   Activity Tolerance Patient tolerated treatment well   Patient Left in chair;with call bell/phone within reach;with chair alarm set   Nurse Communication Mobility status        Time: 1550-1605 OT Time Calculation (min): 15 min  Charges: OT General Charges $OT Visit: 1 Visit OT Treatments $Self Care/Home Management : 8-22 mins  Mauri Brooklyn OTR/L [phone]   Mauri Brooklyn 07/13/2019, 4:29 PM

## 2019-07-14 LAB — GLUCOSE, CAPILLARY: Glucose-Capillary: 109 mg/dL — ABNORMAL HIGH (ref 70–99)

## 2019-07-14 NOTE — Progress Notes (Signed)
Patient discharged home with [facility] through [place].  Discharge instructions given to family friend, Kate Sable.  All questions and concerns addressed.  Patient has clothes, upper dentures, and glasses with her at time of discharge.  PIV removed.  Patient discharged home in the care of family friend.  Earleen Reaper RN

## 2019-07-14 NOTE — Plan of Care (Signed)
Family friend at bedside, Kate Sable.  Instructions reviewed.  All questions/concerns addressed.

## 2019-07-14 NOTE — TOC Transition Note (Signed)
Transition of Care [facility]) - CM/SW Discharge Note   Patient Details  Name: Caitlyn Marshall MRN: [id number] Date of Birth: 02-25-1941  Transition of Care [facility]) CM/SW Contact:  Bartholomew Crews, RN Phone Number: [phone] 07/14/2019, 5:29 PM   Clinical Narrative:     Patient transitioned home today. HH orders in place. Liaison at [place] notified. No further TOC needs identified.   Final next level of care: [place] Barriers to Discharge: No Barriers Identified   Patient Goals and CMS Choice        Discharge Placement                       Discharge Plan and Services   Discharge Planning Services: CM Consult                      HH Arranged: PT, RN, OT, Nurse's Aide [place] Agency: [place] ([facility]) Date HH Agency Contacted: 07/14/19 Time [place]: 1000 Representative spoke with at [place]: [place] ([place]) Interventions     Readmission Risk Interventions No flowsheet data found.

## 2019-07-14 NOTE — Plan of Care (Signed)
  Problem: Safety: Goal: Ability to remain free from injury will improve Outcome: Progressing   

## 2019-07-14 NOTE — Progress Notes (Signed)
Physical Therapy Treatment Patient Details Name: Caitlyn Marshall MRN: [id number] DOB: 1941-02-18 Today's Date: 07/14/2019    History of Present Illness  Caitlyn Marshall is a 79 year old female with PMH hypothyroidism, HTN, hyperlipidemia, type 2 DM, CKD 3/4 who was brought to the ER from home after daughter found her gurgling and unresponsive in bed w/right-sided facial droop as well as right arm weakness and she was brought to the ER for further evaluation.The patient had also endorsed shortness of breath and vomiting recently at home.CT of the head revealed a questionable hypodensity in the right paramedian pons. MRA revelaed mild small vessel change of the hemispheric white matter. No pontine infarction as was questioned by CT. No additional acute findings    PT Comments    Continuing work on functional mobility and activity tolerance;  Overall moving well, no overt loss of balance today; Amb distance limited by headache, and pt requesting to head back to the room -- she seemed encouraged at the news she's going home today   Follow Up Recommendations  Home health PT;Supervision/Assistance - 24 hour;Supervision - Intermittent     Equipment Recommendations  None recommended by PT    Recommendations for Other Services       Precautions / Restrictions Precautions Precautions: Fall    Mobility  Bed Mobility                  Transfers Overall transfer level: Needs assistance Equipment used: None Transfers: Sit to/from Stand Sit to Stand: Min guard         General transfer comment: Stood from recliner with good push up using armrests  Ambulation/Gait Ambulation/Gait assistance: Min guard Gait Distance (Feet): 80 Feet Assistive device: None;Rolling walker (2 wheeled) Gait Pattern/deviations: Decreased step length - right;Decreased step length - left;Narrow base of support Gait velocity: slightly dec   General Gait Details: [place] throughout for safety; Narrow, short  steps; no overt loss of balance; noteable that her steps were smoother without assistive device, however she does reach out for UE support at times   Stairs             Wheelchair Mobility    Modified Rankin (Stroke Patients Only)       Balance     Sitting balance-Leahy Scale: Good       Standing balance-Leahy Scale: Fair                              Cognition Arousal/Alertness: Awake/alert Behavior During Therapy: WFL for tasks assessed/performed Overall Cognitive Status: No family/caregiver present to determine baseline cognitive functioning                                 General Comments: Pt pleasant and willing to participate in therapy.       Exercises      General Comments        Pertinent Vitals/Pain Pain Assessment: Faces Faces Pain Scale: Hurts a little bit Pain Location: Headache Pain Descriptors / Indicators: Grimacing;Headache Pain Intervention(s): Monitored during session    Home Living                      Prior Function            PT Goals (current goals can now be found in the care plan section) Acute Rehab PT Goals Patient Stated Goal: to  go home PT Goal Formulation: With patient Time For Goal Achievement: 07/23/19 Potential to Achieve Goals: Good Progress towards PT goals: Progressing toward goals    Frequency    Min 3X/week      PT Plan Current plan remains appropriate    Co-evaluation              AM-PAC PT "6 Clicks" Mobility   Outcome Measure  Help needed turning from your back to your side while in a flat bed without using bedrails?: None Help needed moving from lying on your back to sitting on the side of a flat bed without using bedrails?: None Help needed moving to and from a bed to a chair (including a wheelchair)?: A Little Help needed standing up from a chair using your arms (e.g., wheelchair or bedside chair)?: None Help needed to walk in hospital room?: A  Little Help needed climbing 3-5 steps with a railing? : A Little 6 Click Score: 21    End of Session Equipment Utilized During Treatment: Gait belt Activity Tolerance: Patient tolerated treatment well Patient left: in chair;with call bell/phone within reach;with chair alarm set Nurse Communication: Mobility status PT Visit Diagnosis: Unsteadiness on feet (R26.81);Other abnormalities of gait and mobility (R26.89)     Time: 9166-0600 PT Time Calculation (min) (ACUTE ONLY): 12 min  Charges:  $Gait Training: 8-22 mins                     Roney Marion, [place]  Acute Rehabilitation Services Pager [phone] Office [phone]    Colletta [place] 07/14/2019, 11:25 AM

## 2019-07-14 NOTE — Progress Notes (Signed)
Patient was initially discharged yesterday after long discussion with patient's daughter as she was a stable.  However, after the discharge orders were placed and summary was completed, nursing staff was called by the daughter that she could not arrange a ride for the patient and requested for the patient to stay overnight in the hospital.  Patient seen and examined.  She has no complaints.  She is alert and oriented to self.  This is her baseline due to dementia.  There has been no change in medical status since yesterday.  Patient still is stable for discharge.  I was informed by primary RN that patient's niece is on her way to pick her up.  General exam: Appears calm and comfortable  Respiratory system: Clear to auscultation. Respiratory effort normal. Cardiovascular system: S1 & S2 heard, RRR. No JVD, murmurs, rubs, gallops or clicks. No pedal edema. Gastrointestinal system: Abdomen is nondistended, soft and nontender. No organomegaly or masses felt. Normal bowel sounds heard. Central nervous system: Alert and oriented x1. No focal neurological deficits. Extremities: Symmetric 5 x 5 power. Skin: No rashes, lesions or ulcers.  Psychiatry: Judgement and insight appear poor. Mood & affect flat poor

## 2019-07-15 ENCOUNTER — Telehealth: Payer: Self-pay | Admitting: Family Medicine

## 2019-07-15 ENCOUNTER — Telehealth: Payer: Self-pay

## 2019-07-15 ENCOUNTER — Inpatient Hospital Stay: Payer: Medicare Other | Admitting: Oncology

## 2019-07-15 DIAGNOSIS — M1990 Unspecified osteoarthritis, unspecified site: Secondary | ICD-10-CM | POA: Diagnosis not present

## 2019-07-15 DIAGNOSIS — K5900 Constipation, unspecified: Secondary | ICD-10-CM | POA: Diagnosis not present

## 2019-07-15 DIAGNOSIS — E039 Hypothyroidism, unspecified: Secondary | ICD-10-CM | POA: Diagnosis not present

## 2019-07-15 DIAGNOSIS — N184 Chronic kidney disease, stage 4 (severe): Secondary | ICD-10-CM | POA: Diagnosis not present

## 2019-07-15 DIAGNOSIS — E1122 Type 2 diabetes mellitus with diabetic chronic kidney disease: Secondary | ICD-10-CM | POA: Diagnosis not present

## 2019-07-15 DIAGNOSIS — E1151 Type 2 diabetes mellitus with diabetic peripheral angiopathy without gangrene: Secondary | ICD-10-CM | POA: Diagnosis not present

## 2019-07-15 DIAGNOSIS — N390 Urinary tract infection, site not specified: Secondary | ICD-10-CM | POA: Diagnosis not present

## 2019-07-15 DIAGNOSIS — I129 Hypertensive chronic kidney disease with stage 1 through stage 4 chronic kidney disease, or unspecified chronic kidney disease: Secondary | ICD-10-CM | POA: Diagnosis not present

## 2019-07-15 DIAGNOSIS — M1030 Gout due to renal impairment, unspecified site: Secondary | ICD-10-CM | POA: Diagnosis not present

## 2019-07-15 DIAGNOSIS — M719 Bursopathy, unspecified: Secondary | ICD-10-CM | POA: Diagnosis not present

## 2019-07-15 DIAGNOSIS — J309 Allergic rhinitis, unspecified: Secondary | ICD-10-CM | POA: Diagnosis not present

## 2019-07-15 DIAGNOSIS — D631 Anemia in chronic kidney disease: Secondary | ICD-10-CM | POA: Diagnosis not present

## 2019-07-15 NOTE — Telephone Encounter (Signed)
Mardene Celeste, with [facility], calling to request verbal orders.    1w5 for skilled nursing  3w5 for [facility] aide  1 day 1 for medical social work.    CB# [phone]

## 2019-07-15 NOTE — Telephone Encounter (Signed)
Copied from [place] [id number]. Topic: Appointment Scheduling - Scheduling Inquiry for Clinic >> Jul 15, 2019  3:07 PM Oneta Rack wrote: Patient seeking a hospital follow up within 7 days with PCP, daughter will be in town between the 4th of May thru May 18th and will be patient transportation. Please advise if patient can be worked in.

## 2019-07-15 NOTE — Telephone Encounter (Signed)
Advised 

## 2019-07-15 NOTE — Telephone Encounter (Signed)
Transition Care Management Follow-up Telephone Call  Date of discharge and from where: 07/14/19 [facility]  How have you been since you were released from the hospital? Spoke to patients grandson Caryn Bee who states she c/o feeling tired and was resting most of the day yesterday post discharge. Today she is more alert and "snappy" but denies pain. Family assisting with ADL's. [facility] at patients home for assessment during call.   Any questions or concerns? No   Items Reviewed:  Did the pt receive and understand the discharge instructions provided? Yes   Medications obtained and verified? Yes   Any new allergies since your discharge? No   Dietary orders reviewed? Yes  Do you have support at home? Yes   Functional Questionnaire: (I = Independent and D = Dependent) ADLs: D  Bathing/Dressing- D  Meal Prep- D  Eating- I  Maintaining continence- D  Transferring/Ambulation- D  Managing Meds- D  Follow up appointments reviewed:   PCP Hospital f/u appt confirmed? No  Please advise front desk for scheduling due to no available appointments.   Are transportation arrangements needed? No   If their condition worsens, is the pt aware to call PCP or go to the Emergency Dept.? Yes  Was the patient provided with contact information for the PCP's office or ED? Yes  Was to pt encouraged to call back with questions or concerns? Yes

## 2019-07-15 NOTE — Telephone Encounter (Signed)
That's fine

## 2019-07-16 ENCOUNTER — Telehealth: Payer: Self-pay | Admitting: Family Medicine

## 2019-07-16 DIAGNOSIS — E1151 Type 2 diabetes mellitus with diabetic peripheral angiopathy without gangrene: Secondary | ICD-10-CM | POA: Diagnosis not present

## 2019-07-16 DIAGNOSIS — M1030 Gout due to renal impairment, unspecified site: Secondary | ICD-10-CM | POA: Diagnosis not present

## 2019-07-16 DIAGNOSIS — I129 Hypertensive chronic kidney disease with stage 1 through stage 4 chronic kidney disease, or unspecified chronic kidney disease: Secondary | ICD-10-CM | POA: Diagnosis not present

## 2019-07-16 DIAGNOSIS — N184 Chronic kidney disease, stage 4 (severe): Secondary | ICD-10-CM | POA: Diagnosis not present

## 2019-07-16 DIAGNOSIS — E1122 Type 2 diabetes mellitus with diabetic chronic kidney disease: Secondary | ICD-10-CM | POA: Diagnosis not present

## 2019-07-16 DIAGNOSIS — D631 Anemia in chronic kidney disease: Secondary | ICD-10-CM | POA: Diagnosis not present

## 2019-07-16 DIAGNOSIS — M719 Bursopathy, unspecified: Secondary | ICD-10-CM | POA: Diagnosis not present

## 2019-07-16 DIAGNOSIS — J309 Allergic rhinitis, unspecified: Secondary | ICD-10-CM | POA: Diagnosis not present

## 2019-07-16 DIAGNOSIS — K5900 Constipation, unspecified: Secondary | ICD-10-CM | POA: Diagnosis not present

## 2019-07-16 DIAGNOSIS — M1990 Unspecified osteoarthritis, unspecified site: Secondary | ICD-10-CM | POA: Diagnosis not present

## 2019-07-16 DIAGNOSIS — N390 Urinary tract infection, site not specified: Secondary | ICD-10-CM | POA: Diagnosis not present

## 2019-07-16 DIAGNOSIS — E039 Hypothyroidism, unspecified: Secondary | ICD-10-CM | POA: Diagnosis not present

## 2019-07-16 DIAGNOSIS — R413 Other amnesia: Secondary | ICD-10-CM

## 2019-07-16 MED ORDER — DONEPEZIL HCL 5 MG PO TABS: 5.0000 mg | ORAL_TABLET | Freq: Every day | ORAL | 5 refills | Status: DC

## 2019-07-16 NOTE — Telephone Encounter (Signed)
That's fine

## 2019-07-16 NOTE — Telephone Encounter (Signed)
Patient scheduled with Tawanna Sat for a hospital follow up on 5/3

## 2019-07-16 NOTE — Telephone Encounter (Signed)
Home Health Verbal Orders - Caller/Agency: [facility] Callback Number: [phone] [facility]SHU[id number] Requesting OT/PT/Skilled Nursing/Social Work/Speech Therapy: OT Frequency: 1wk for 1x and 2wks for 1x

## 2019-07-16 NOTE — Telephone Encounter (Signed)
Left VM advising Caitlyn Marshall with [facility] okay for verbal orders.

## 2019-07-16 NOTE — Telephone Encounter (Signed)
Patient sister Apolonio Schneiders is calling to see if the patient medication donepezil (ARICEPT) 10 MG tablet [[id number]] can be changed back to 5mg . Please advise Patient has appt on Monday. Please advise Cb- [phone]

## 2019-07-16 NOTE — Telephone Encounter (Signed)
She can have the 1120 slot on Wednesday the 5th

## 2019-07-16 NOTE — Telephone Encounter (Signed)
Verbal order given to [facility] with [place]

## 2019-07-16 NOTE — Telephone Encounter (Signed)
Home Health Verbal Orders - Caller/AgencyGilles Chiquito Number:[phone] Requesting OT/PT/Skilled Nursing/Social Work/Speech Therapy: PT  Frequency:  1 week 1  2week 1  1 week 1

## 2019-07-16 NOTE — Telephone Encounter (Addendum)
Attempted to contact patient, no answer. Dr. Caryn Section has a open slot on 5/17 but 10 but not within the 7 day period. Patient can be scheduled with a PA. Okay for PEC to advise patient's daughter and schedule a hospital follow up for 40 min slot.

## 2019-07-16 NOTE — Telephone Encounter (Signed)
Have sent prescription for 5mg  tablets to her pharmacy

## 2019-07-16 NOTE — Telephone Encounter (Signed)
Attempted to contact patient, no answer or voicemail. Okay for [facility]EC to advise patient's family.

## 2019-07-17 NOTE — Progress Notes (Signed)
[facility]  [phone] [phone]  ID: [name] OB: [birth year]  MR#: [id number]  HYQ#:[id number]  Patient Care Team: Birdie Sons, MD as PCP - General (Family Medicine) [name], OD as Consulting Physician (Optometry) [name], [name], MD as Physician Assistant (Endocrinology) [name], MD as Consulting Physician (Internal Medicine) [name], MD as Consulting Physician (Pulmonary Disease) [name], RN as Oncology Nurse Navigator [name], [name], MD as Consulting Physician (Oncology)  CHIEF COMPLAINT: Nodule of the lower lobe left lung.  INTERVAL HISTORY: Patient returns to clinic today for further evaluation, discussion of her PET scan results, and additional diagnostic planning.  She is admitted to the hospital recently with confusion and altered mental status that resolved without intervention.  Thought was she had a TIA, but etiology remains unclear.  She currently feels well and is back to her baseline.  She has no further neurologic complaints.  She denies any recent fevers or illnesses.  She has no chest pain, shortness of breath, cough, or hemoptysis.  She has no nausea, vomiting, constipation, or diarrhea.  She has no urinary complaints.  Patient offers no further specific complaints today.  REVIEW OF SYSTEMS:   Review of Systems  Constitutional: Positive for weight loss. Negative for fever and malaise/fatigue.  Respiratory: Negative.  Negative for cough, hemoptysis and shortness of breath.   Cardiovascular: Negative.  Negative for chest pain and leg swelling.  Gastrointestinal: Negative.  Negative for abdominal pain.  Genitourinary: Negative.  Negative for dysuria.  Musculoskeletal: Negative.  Negative for back pain.  Skin: Negative.  Negative for rash.  Neurological: Negative.  Negative for dizziness, focal weakness, weakness and headaches.  Psychiatric/Behavioral: Negative.  The patient is not  nervous/anxious.     As per HPI. Otherwise, a complete review of systems is negative.  PAST MEDICAL HISTORY: Past Medical History:  Diagnosis Date  . Chronic kidney disease   . Diabetes mellitus without complication ([place])   . Gout   . Hyperlipidemia   . Hypertension   . Hypothyroidism   . Thyroid disease     PAST SURGICAL HISTORY: Past Surgical History:  Procedure Laterality Date  . CATARACT EXTRACTION W/PHACO Left 05/26/2018   Procedure: CATARACT EXTRACTION PHACO AND INTRAOCULAR LENS PLACEMENT ([place])  LEFT DIABETIC;  Surgeon: Leandrew Koyanagi, MD;  Location: [facility];  Service: Ophthalmology;  Laterality: Left;  Diabetic - insulin  . TUBAL LIGATION    . Vascular Stent in right leg      FAMILY HISTORY: Family History  Problem Relation Age of Onset  . Hypertension Mother   . Diabetes Mother        Diabetes mellitus Type 2  . Alzheimer's disease Mother   . Cancer Son        died at age 28, esophagus    ADVANCED DIRECTIVES (Y/N):  N  HEALTH MAINTENANCE: Social History   Tobacco Use  . Smoking status: Former Smoker    Packs/day: 0.50    Years: 30.00    Pack years: 15.00    Types: Cigarettes    Quit date: 03/18/2007    Years since quitting: 12.3  . Smokeless tobacco: Never Used  . Tobacco comment: Quit smoking in 2008  Substance Use Topics  . Alcohol use: No    Alcohol/week: 0.0 standard drinks  . Drug use: No     Colonoscopy:  PAP:  Bone density:  Lipid panel:  No Known Allergies  Current Outpatient Medications  Medication Sig  Dispense Refill  . allopurinol (ZYLOPRIM) 100 MG tablet TAKE 2 TABLETS(200 MG) BY MOUTH TWICE DAILY 360 tablet 3  . ALPRAZolam (XANAX) 0.25 MG tablet Take 1 tablet (0.25 mg total) by mouth every 6 (six) hours as needed. (Patient taking differently: Take 0.25 mg by mouth every 6 (six) hours as needed for anxiety. ) 30 tablet 1  . amLODipine (NORVASC) 10 MG tablet Take 10 mg by mouth daily.    Marland Kitchen ascorbic acid (VITAMIN  C) 500 MG tablet Take 500 mg by mouth daily.    Marland Kitchen aspirin EC 81 MG tablet Take 81 mg by mouth daily.     . Cholecalciferol (VITAMIN D-3) 125 MCG (5000 UT) TABS Take 5,000 Units by mouth daily.    . cyproheptadine (PERIACTIN) 4 MG tablet Start 1/2 tablet three times daily for one week, then increase to 1 tablet three times daily (Patient taking differently: Take 4 mg by mouth 3 (three) times daily. Start 1/2 tablet three times daily for one week, then increase to 1 tablet three times daily) 90 tablet 1  . donepezil (ARICEPT) 5 MG tablet Take 1 tablet (5 mg total) by mouth at bedtime. 30 tablet 5  . doxazosin (CARDURA) 4 MG tablet Take 4 mg by mouth at bedtime.    . hydrochlorothiazide (HYDRODIURIL) 12.5 MG tablet TK 1 T PO QAM UTD FOR LEG SWELLING  11  . insulin NPH Human (NOVOLIN N) 100 UNIT/ML injection Inject 6-7 Units into the skin See admin instructions. Inject 6 units subcutaneously before breakfast and 7 units before supper    . insulin regular (NOVOLIN R,HUMULIN R) 100 units/mL injection Inject 6-10 Units into the skin See admin instructions. Inject 6 units subcutaneously with breakfast, 8 units with lunch and 10 units with supper    . Insulin Syringe-Needle U-100 (INSULIN SYRINGE .5CC/30GX5/16") 30G X 5/16" 0.5 ML MISC U QID UTD  5  . irbesartan (AVAPRO) 300 MG tablet Take 300 mg by mouth every evening.  8  . levothyroxine (SYNTHROID, LEVOTHROID) 50 MCG tablet Take 1 tablet by mouth daily.  5  . lovastatin (MEVACOR) 20 MG tablet TAKE 1 TABLET BY MOUTH DAILY 90 tablet 3  . megestrol (MEGACE) 20 MG tablet Take 1 tablet (20 mg total) by mouth daily. 30 tablet 5  . mirtazapine (REMERON) 7.5 MG tablet Take 7.5 mg by mouth at bedtime.    . montelukast (SINGULAIR) 10 MG tablet TAKE 1 TABLET BY MOUTH EVERY DAY 90 tablet 4  . ondansetron (ZOFRAN) 4 MG tablet Take 4 mg by mouth every 8 (eight) hours as needed for nausea or vomiting.     . ONE TOUCH ULTRA TEST test strip TEST THREE TIMES DAILY 100 each  5  . ONETOUCH DELICA LANCETS 22L MISC INJECT AS DIRECTED THREE TIMES DAILY    . potassium chloride SA (K-DUR,KLOR-CON) 20 MEQ tablet Take 1 tablet by mouth daily.  2  . torsemide (DEMADEX) 20 MG tablet Take 20 mg by mouth daily.      No current facility-administered medications for this visit.    OBJECTIVE: Vitals:   07/21/19 1051  BP: 126/67  Pulse: (!) 102  Resp: 18  Temp: (!) 97.4 F (36.3 C)  SpO2: 99%     Body mass index is 13.12 kg/m.    ECOG FS:1 - Symptomatic but completely ambulatory  General: Thin, no acute distress.  Eyes: Pink conjunctiva, anicteric sclera. HEENT: Normocephalic, moist mucous membranes. Lungs: No audible wheezing or coughing. Heart: Regular rate and rhythm.  Abdomen: Soft, nontender, no obvious distention. Musculoskeletal: No edema, cyanosis, or clubbing. Neuro: Alert, answering all questions appropriately. Cranial nerves grossly intact. Skin: No rashes or petechiae noted. Psych: Normal affect.   LAB RESULTS:  Lab Results  Component Value Date   NA 142 07/13/2019   K 4.5 07/13/2019   CL 107 07/13/2019   CO2 27 07/13/2019   GLUCOSE 116 (H) 07/13/2019   BUN 19 07/13/2019   CREATININE 1.66 (H) 07/13/2019   CALCIUM 9.3 07/13/2019   PROT 6.4 (L) 07/10/2019   ALBUMIN 3.2 (L) 07/10/2019   AST 21 07/10/2019   ALT 12 07/10/2019   ALKPHOS 81 07/10/2019   BILITOT 0.9 07/10/2019   GFRNONAA 29 (L) 07/13/2019   GFRAA 34 (L) 07/13/2019    Lab Results  Component Value Date   WBC 8.1 07/10/2019   NEUTROABS 9.9 (H) 07/08/2019   HGB 12.2 07/10/2019   HCT 38.5 07/10/2019   MCV 78.4 (L) 07/10/2019   PLT 230 07/10/2019     STUDIES: DG Chest 2 View  Result Date: 06/25/2019 CLINICAL DATA:  Lung nodule EXAM: CHEST - 2 VIEW COMPARISON:  04/16/2017, chest CT 06/15/2019 FINDINGS: Left pleural effusion with left basilar atelectasis and lingular consolidation better evaluated on recent chest CT. Emphysema. Normal heart size. No acute osseous  abnormality. IMPRESSION: Left pleural effusion with left basilar atelectasis. Lingular masslike consolidation better evaluated on recent CT. Electronically Signed   By: Macy Mis M.D.   On: 06/25/2019 11:53   CT Head Wo Contrast  Result Date: 06/25/2019 CLINICAL DATA:  Altered mental status EXAM: CT HEAD WITHOUT CONTRAST TECHNIQUE: Contiguous axial images were obtained from the base of the skull through the vertex without intravenous contrast. COMPARISON:  March 08, 2007 FINDINGS: Brain: No evidence of acute territorial infarction, hemorrhage, hydrocephalus,extra-axial collection or mass lesion/mass effect. There is dilatation the ventricles and sulci consistent with age-related atrophy. Low-attenuation changes in the deep white matter consistent with small vessel ischemia. Vascular: No hyperdense vessel or unexpected calcification. Skull: The skull is intact. No fracture or focal lesion identified. Sinuses/Orbits: The visualized paranasal sinuses and mastoid air cells are clear. The orbits and globes intact. Other: None IMPRESSION: No acute intracranial abnormality. Findings consistent with age related atrophy and chronic small vessel ischemia Electronically Signed   By: Prudencio Pair M.D.   On: 06/25/2019 11:43   MR ANGIO HEAD WO CONTRAST  Result Date: 07/08/2019 CLINICAL DATA:  Found unresponsive in bed. Right-sided facial droop and right arm weakness. Right pontine infarction question by CT. EXAM: MRI HEAD WITHOUT AND WITH CONTRAST MRA HEAD WITHOUT CONTRAST TECHNIQUE: Multiplanar, multiecho pulse sequences of the brain and surrounding structures were obtained without and with intravenous contrast. Angiographic images of the head were obtained using MRA technique without contrast. CONTRAST:  25mL GADAVIST GADOBUTROL 1 MMOL/ML IV SOLN COMPARISON:  Head CT same day FINDINGS: MRI HEAD FINDINGS Brain: Diffusion imaging does not show any acute or subacute infarction. No brainstem insult. Cerebellum shows  age related atrophy but no focal insult. Cerebral hemispheres show age related atrophy with only very minimal small vessel change of the white matter. No cortical or large vessel territory infarction. No mass lesion, hemorrhage, hydrocephalus or extra-axial collection. After contrast administration, no abnormal enhancement occurs. Vascular: Major vessels at the base of the brain show flow. Skull and upper cervical spine: Negative Sinuses/Orbits: Minimal mucosal edematous changes. Small amount of fluid in the right mastoid tip. Orbits negative. Other: None MRA HEAD FINDINGS Both internal carotid arteries widely patent  through the skull base and siphon regions. The anterior and middle cerebral vessels are patent without proximal stenosis, aneurysm or vascular malformation. More distal branch vessels do show some atherosclerotic irregularity. Dominant right vertebral artery widely patent to the basilar. The left vertebral artery is a small vessel, either developmental or secondary to acquired atherosclerosis. No basilar stenosis. Posterior circulation branch vessels are patent. Distal PCA branches show some atherosclerotic irregularity. IMPRESSION: No acute or reversible brain finding. Mild age related volume loss. Mild small vessel change of the hemispheric white matter. No pontine infarction as was questioned by CT. No explanation for the mental status changes. MR angiography does not show any acute finding. Mild intracranial atherosclerotic irregularity of the distal branch vessels. Small left vertebral artery could be congenital or due to acquired atherosclerosis. Electronically Signed   By: Nelson Chimes M.D.   On: 07/08/2019 09:58   MR BRAIN W WO CONTRAST  Result Date: 07/08/2019 CLINICAL DATA:  Found unresponsive in bed. Right-sided facial droop and right arm weakness. Right pontine infarction question by CT. EXAM: MRI HEAD WITHOUT AND WITH CONTRAST MRA HEAD WITHOUT CONTRAST TECHNIQUE: Multiplanar, multiecho  pulse sequences of the brain and surrounding structures were obtained without and with intravenous contrast. Angiographic images of the head were obtained using MRA technique without contrast. CONTRAST:  60mL GADAVIST GADOBUTROL 1 MMOL/ML IV SOLN COMPARISON:  Head CT same day FINDINGS: MRI HEAD FINDINGS Brain: Diffusion imaging does not show any acute or subacute infarction. No brainstem insult. Cerebellum shows age related atrophy but no focal insult. Cerebral hemispheres show age related atrophy with only very minimal small vessel change of the white matter. No cortical or large vessel territory infarction. No mass lesion, hemorrhage, hydrocephalus or extra-axial collection. After contrast administration, no abnormal enhancement occurs. Vascular: Major vessels at the base of the brain show flow. Skull and upper cervical spine: Negative Sinuses/Orbits: Minimal mucosal edematous changes. Small amount of fluid in the right mastoid tip. Orbits negative. Other: None MRA HEAD FINDINGS Both internal carotid arteries widely patent through the skull base and siphon regions. The anterior and middle cerebral vessels are patent without proximal stenosis, aneurysm or vascular malformation. More distal branch vessels do show some atherosclerotic irregularity. Dominant right vertebral artery widely patent to the basilar. The left vertebral artery is a small vessel, either developmental or secondary to acquired atherosclerosis. No basilar stenosis. Posterior circulation branch vessels are patent. Distal PCA branches show some atherosclerotic irregularity. IMPRESSION: No acute or reversible brain finding. Mild age related volume loss. Mild small vessel change of the hemispheric white matter. No pontine infarction as was questioned by CT. No explanation for the mental status changes. MR angiography does not show any acute finding. Mild intracranial atherosclerotic irregularity of the distal branch vessels. Small left vertebral  artery could be congenital or due to acquired atherosclerosis. Electronically Signed   By: Nelson Chimes M.D.   On: 07/08/2019 09:58   NM PET Image Initial (PI) Skull Base To Thigh  Result Date: 07/07/2019 CLINICAL DATA:  Initial treatment strategy for lung mass. EXAM: NUCLEAR MEDICINE PET SKULL BASE TO THIGH TECHNIQUE: 5.6 mCi F-18 FDG was injected intravenously. Full-ring PET imaging was performed from the skull base to thigh after the radiotracer. CT data was obtained and used for attenuation correction and anatomic localization. Fasting blood glucose: 139 mg/dl COMPARISON:  Prior chest CTs most recent with the chest abdomen and pelvis study of June 15, 2019 FINDINGS: Mediastinal blood pool activity: SUV max 1.84 Liver activity: SUV max  NA NECK: No hypermetabolic lymph nodes in the neck. Incidental CT findings: none CHEST: Left juxta hilar mass in the lingula distorting the fissure and inseparable from the left cardiac border and extending from the hilum into the left chest measuring approximately 4.8 x 3.8 cm (SUVmax = 7.4) quite similar to its appearance on the diagnostic CT obtained on 06/15/2019 Associated with hypermetabolic right paratracheal, precarinal, subcarinal and left hilar adenopathyw (Image 94, series 3): Hypermetabolic subcarinal lymph node 8 mm short axis (SUVmax = 6.0) Precarinal adenopathy similar size (SUVmax = 5.0) (image 86, series 3 Mildly hypermetabolic high right paratracheal lymph node (image 75, series 3) (SUVmax = 3.2 Left-sided pleural effusion with mild hypermetabolic activity at the periphery most areas corresponding to collapsed lung with metabolic activity slightly greater than blood pool. Mildly increased metabolic activity about the right hilum (image 102, series 3) (SUVmax = 2.8) 7 mm lymph node in this location. Background marked pulmonary emphysema with bullous changes in the right lower lobe. Along the margin of 1 of these areas of bullous disease is an area of  nodularity that is unchanged in the short interval measuring approximately 2 x 1.9 cm (image 110, series 3) (SUVmax = 2.3) Incidental CT findings: calcified atheromatous plaque throughout the thoracic aorta. Mass abutting the left cardiac border. Small pericardial effusion. No chest wall lesion. No new finding since the previous CT study. Airways are patent. Left pleural effusion with similar volume to slightly diminished volume. ABDOMEN/PELVIS: Asymmetric renal activity with upper pole activity that extend slightly beyond the contour of the left kidney but without CT correlate. A similar appearance in the upper pole the right kidney where there is cortical scarring. No hypermetabolic areas in other solid organs. Changes of chronic calcific pancreatitis. No abdominal adenopathy. Crowding of bowel loops in the abdomen due to cachexia and limited intra-abdominal fat limiting assessment. Incidental CT findings: Low-density lesions in the liver likely small cysts. Limited visualization of the adrenal glands which are mildly nodular but not associated with increased FDG uptake beyond liver or blood pool. Numerous bilateral intrarenal calculi and likely parenchymal calculi in the kidneys. No hydronephrosis. No visible renal lesion. Limited assessment of bowel loops due to lack of mesenteric and intra-abdominal fat with similar appearance of visualized bowel accounting for lack of contrast on today's study is compared to the study of 06/15/2019. SKELETON: No focal hypermetabolic activity to suggest skeletal metastasis. Incidental CT findings: Spinal degenerative changes. IMPRESSION: 1. Hypermetabolic mass in the left chest with contralateral mediastinal adenopathy showing hypermetabolic features, suspicious for primary bronchogenic neoplasm. 2. Lesion distorts the fissure and is inseparable from the left heart border. 3. Left effusion without discrete marked hypermetabolic features. Collapsed lung along the anterior margin  displaying slightly greater than blood pool activity. 4. Mild increased metabolism in right hilar lymph nodes and a focus of nodularity along bullous changes in the right lower lobe. Finding may represent a second primary or metastatic disease, increased in size since the study of May 01, 2017. Currently approximately 2 x 1.9 cm, area of concern along the anterolateral margin of this cystic and or bullous area previously measuring approximately 15 x 11 mm in 2019. 5. Areas of activity not registering well with the renal profile on CT images likely associated with pooling of FDG within excreted urine in areas of patulous collecting systems or in dependent collecting systems. Dedicated renal imaging may be helpful for further assessment, ultrasound could be considered given small size of the patient as renal function may  not allow for administration of intravenous contrast. 6. Changes of chronic calcific pancreatitis. Electronically Signed   By: Zetta Bills M.D.   On: 07/07/2019 16:12   DG Chest Port 1 View  Result Date: 07/08/2019 CLINICAL DATA:  Cough. Known lung mass. EXAM: PORTABLE CHEST 1 VIEW COMPARISON:  PET CT yesterday. Radiograph 06/25/2019. Chest CT 06/15/2019 FINDINGS: Left pleuroparenchymal opacity corresponds to lung mass and pleural effusion on PET CT yesterday. Emphysema. [place] in the right lower lung zone with adjacent nodular consolidation again seen. Left hilar calcified nodes. No pneumothorax. Heart is normal in size. Pancreatic calcifications in the upper abdomen. IMPRESSION: 1. Left pleuroparenchymal opacity corresponds to lung mass and pleural effusion, characterized on PET CT yesterday. 2. Emphysema.  No acute findings by radiograph. Electronically Signed   By: Keith Rake M.D.   On: 07/08/2019 03:16   EEG adult  Result Date: 07/09/2019 Lora Havens, MD     07/09/2019  8:31 AM Patient Name: Caitlyn Marshall MRN: [id number] Epilepsy Attending: Lora Havens Referring  Physician/Provider: Dr. Amie Portland Date: 07/08/2019 Duration: 24.36 minutes Patient history: 79 year old female who presented with new onset right facial droop, right-sided weakness and altered mental status.  MRI did not show any acute stroke.  EEG evaluate for seizures. Level of alertness: Awake, asleep AEDs during EEG study: None Technical aspects: This EEG study was done with scalp electrodes positioned according to the 10-20 International system of electrode placement. Electrical activity was acquired at a sampling rate of 500Hz  and reviewed with a high frequency filter of 70Hz  and a low frequency filter of 1Hz . EEG data were recorded continuously and digitally stored. Description: No clear posterior dominant rhythm was seen.  Sleep was characterized by vertex waves, maximal frontocentral region.  EEG showed initial was polymorphic 3 to 5 Hz theta-delta slowing. Hyperventilation and photic stimulation were not performed. Abnormality - Continuous slow, generalized IMPRESSION: This study is suggestive of moderate diffuse encephalopathy, nonspecific to etiology.  No seizures or epileptiform discharges were seen throughout the recording. Lora Havens   CT HEAD CODE STROKE WO CONTRAST  Result Date: 07/08/2019 CLINICAL DATA:  Code stroke. Initial evaluation for acute altered mental status, right-sided weakness. EXAM: CT HEAD WITHOUT CONTRAST TECHNIQUE: Contiguous axial images were obtained from the base of the skull through the vertex without intravenous contrast. COMPARISON:  None. FINDINGS: Brain: Generalized age-related cerebral atrophy with moderate chronic microischemic disease. No acute intracranial hemorrhage. There is question of a focal hypodensity involving the right paramedian pons, difficult to be certain as this area is prone artifact on CT. No other acute large vessel territory infarct. No mass lesion, midline shift or mass effect. No hydrocephalus or extra-axial fluid collection. Vascular: No  hyperdense vessel. Calcified atherosclerosis present at skull base. Skull: Scalp soft tissues and calvarium within normal limits. Sinuses/Orbits: Globes orbital soft tissues demonstrate no acute finding. Paranasal sinuses are largely clear. Small chronic appearing right mastoid effusion noted. Left mastoid air cells are clear. Other: None. ASPECTS [facility] Stroke Program Early CT Score) - Ganglionic level infarction (caudate, lentiform nuclei, internal capsule, insula, M1-M3 cortex): 7 - Supraganglionic infarction (M4-M6 cortex): 3 Total score (0-10 with 10 being normal): 10 IMPRESSION: 1. Question a focal hypodensity involving the right paramedian pons, which could reflect an evolving acute ischemic infarct. Difficult to be certain of this finding as this area is prone to artifact on CT. No intracranial hemorrhage. 2. ASPECTS is 10. 3. Age-related cerebral atrophy with chronic microvascular ischemic disease. These results were  communicated to Dr. Cheral Marker at 2:25 amon 4/22/2021by text page via the [facility] messaging system. Electronically Signed   By: Jeannine Boga M.D.   On: 07/08/2019 02:30    ASSESSMENT:  Nodule of the lower lobe left lung.  PLAN:    1. Nodule of the lower lobe left lung: CT scan results from June 15, 2019 reviewed independently and report as above with persistent suspicion of malignancy in lingula of left lower lobe.  Patient also has possible enlarged precarinal and subcarinal lymph nodes.  MRI of her brain on July 08, 2019 did not reveal metastatic disease.  PET scan on July 07, 2019 reviewed independently and report as above with significant hypermetabolism of left lower lobe lesion as well as contralateral mediastinal lymphadenopathy possibly consistent with a second primary.  Patient will require biopsy for further evaluation.  She has an appointment with pulmonary later this month for further evaluation and discussion of navigational bronchoscopy.  Patient will follow-up 1  week after her biopsy to discuss the results and treatment planning.   2.  Weight loss: Encouraged adding dietary supplements such as boost or Ensure to maintain.  Consider dietary consult in the near future. 3.  Possible TIA: Patient has no obvious residual neurologic deficits.  I spent a total of 30 minutes reviewing chart data, face-to-face evaluation with the patient, counseling and coordination of care as detailed above.   Patient expressed understanding and was in agreement with this plan. She also understands that She can call clinic at any time with any questions, concerns, or complaints.   Cancer Staging No matching staging information was found for the patient.  Lloyd Huger, MD   07/21/2019 1:51 PM

## 2019-07-19 ENCOUNTER — Inpatient Hospital Stay: Payer: Self-pay | Admitting: Physician Assistant

## 2019-07-19 NOTE — Progress Notes (Deleted)
Established patient visit   Patient: Caitlyn Marshall   DOB: 1940/08/29   79 y.o. Female  MRN: [id number] Visit Date: 07/19/2019  Today's healthcare provider: Mar Daring, PA-C   No chief complaint on file.  Subjective    HPI  Follow up Hospitalization  Patient was admitted to [facility] on 07/08/2019 and discharged on 07/14/2019. She was treated for Altered Mental Status Treatment for this included CT and MRI Telephone follow up was done on *** She reports {excellent/good/fair:19992} compliance with treatment. She reports this condition is {resolved/improved/worsened:23923}.  ----------------------------------------------------------------------------------------- -   {Show patient history (optional):23778::" "}   Medications: Outpatient Medications Prior to Visit  Medication Sig  . allopurinol (ZYLOPRIM) 100 MG tablet TAKE 2 TABLETS(200 MG) BY MOUTH TWICE DAILY  . ALPRAZolam (XANAX) 0.25 MG tablet Take 1 tablet (0.25 mg total) by mouth every 6 (six) hours as needed. (Patient taking differently: Take 0.25 mg by mouth every 6 (six) hours as needed for anxiety. )  . amLODipine (NORVASC) 10 MG tablet Take 10 mg by mouth daily.  Marland Kitchen ascorbic acid (VITAMIN C) 500 MG tablet Take 500 mg by mouth daily.  Marland Kitchen aspirin EC 81 MG tablet Take 81 mg by mouth daily.   . Cholecalciferol (VITAMIN D-3) 125 MCG (5000 UT) TABS Take 5,000 Units by mouth daily.  . cyproheptadine (PERIACTIN) 4 MG tablet Start 1/2 tablet three times daily for one week, then increase to 1 tablet three times daily (Patient taking differently: Take 4 mg by mouth 3 (three) times daily. Start 1/2 tablet three times daily for one week, then increase to 1 tablet three times daily)  . donepezil (ARICEPT) 5 MG tablet Take 1 tablet (5 mg total) by mouth at bedtime.  Marland Kitchen doxazosin (CARDURA) 4 MG tablet Take 4 mg by mouth at bedtime.  . hydrochlorothiazide (HYDRODIURIL) 12.5 MG tablet TK 1 T PO QAM UTD FOR LEG SWELLING    . insulin NPH Human (NOVOLIN N) 100 UNIT/ML injection Inject 6-7 Units into the skin See admin instructions. Inject 6 units subcutaneously before breakfast and 7 units before supper  . insulin regular (NOVOLIN R,HUMULIN R) 100 units/mL injection Inject 6-10 Units into the skin See admin instructions. Inject 6 units subcutaneously with breakfast, 8 units with lunch and 10 units with supper  . Insulin Syringe-Needle U-100 (INSULIN SYRINGE .5CC/30GX5/16") 30G X 5/16" 0.5 ML MISC U QID UTD  . irbesartan (AVAPRO) 300 MG tablet Take 300 mg by mouth every evening.  Marland Kitchen levothyroxine (SYNTHROID, LEVOTHROID) 50 MCG tablet Take 1 tablet by mouth daily.  Marland Kitchen lovastatin (MEVACOR) 20 MG tablet TAKE 1 TABLET BY MOUTH DAILY (Patient taking differently: Take 20 mg by mouth daily. )  . megestrol (MEGACE) 20 MG tablet Take 1 tablet (20 mg total) by mouth daily.  . mirtazapine (REMERON) 7.5 MG tablet Take 7.5 mg by mouth at bedtime.  . montelukast (SINGULAIR) 10 MG tablet TAKE 1 TABLET BY MOUTH EVERY DAY (Patient taking differently: Take 10 mg by mouth at bedtime. )  . ondansetron (ZOFRAN) 4 MG tablet Take 4 mg by mouth every 8 (eight) hours as needed for nausea or vomiting.   . ONE TOUCH ULTRA TEST test strip TEST THREE TIMES DAILY  . ONETOUCH DELICA LANCETS 96V MISC INJECT AS DIRECTED THREE TIMES DAILY  . potassium chloride SA (K-DUR,KLOR-CON) 20 MEQ tablet Take 1 tablet by mouth daily.  Marland Kitchen torsemide (DEMADEX) 20 MG tablet Take 20 mg by mouth daily.    No facility-administered  medications prior to visit.    Review of Systems  {Show previous labs (optional):23779::" "}  Objective    There were no vitals taken for this visit. {Show previous vital signs (optional):23777::" "}  Physical Exam  ***  No results found for any visits on 07/19/19.  Assessment & Plan     ***  No follow-ups on file.      {provider attestation***:1}   [name]  [facility] [phone]  (phone) [phone] (fax)  [place]

## 2019-07-19 NOTE — Telephone Encounter (Signed)
Phone call to pt.  Spoke with pt's daughter, Ivin Booty.  Advised that pt. has an appt. this AM, and that Aricept dosage has been changed to 5 mg tablet.  Per daughter, the family has decided the pt. Is doing better on the Aricept 10 mg., and want to continue this dose.  Encouraged to keep the appt. with Dr. Caryn Section this AM, and to further discuss with him.  Daughter verb. understanding.

## 2019-07-20 DIAGNOSIS — J309 Allergic rhinitis, unspecified: Secondary | ICD-10-CM | POA: Diagnosis not present

## 2019-07-20 DIAGNOSIS — E1151 Type 2 diabetes mellitus with diabetic peripheral angiopathy without gangrene: Secondary | ICD-10-CM | POA: Diagnosis not present

## 2019-07-20 DIAGNOSIS — N390 Urinary tract infection, site not specified: Secondary | ICD-10-CM | POA: Diagnosis not present

## 2019-07-20 DIAGNOSIS — M719 Bursopathy, unspecified: Secondary | ICD-10-CM | POA: Diagnosis not present

## 2019-07-20 DIAGNOSIS — I129 Hypertensive chronic kidney disease with stage 1 through stage 4 chronic kidney disease, or unspecified chronic kidney disease: Secondary | ICD-10-CM | POA: Diagnosis not present

## 2019-07-20 DIAGNOSIS — N184 Chronic kidney disease, stage 4 (severe): Secondary | ICD-10-CM | POA: Diagnosis not present

## 2019-07-20 DIAGNOSIS — E1122 Type 2 diabetes mellitus with diabetic chronic kidney disease: Secondary | ICD-10-CM | POA: Diagnosis not present

## 2019-07-20 DIAGNOSIS — D631 Anemia in chronic kidney disease: Secondary | ICD-10-CM | POA: Diagnosis not present

## 2019-07-20 DIAGNOSIS — M1030 Gout due to renal impairment, unspecified site: Secondary | ICD-10-CM | POA: Diagnosis not present

## 2019-07-20 DIAGNOSIS — E039 Hypothyroidism, unspecified: Secondary | ICD-10-CM | POA: Diagnosis not present

## 2019-07-20 DIAGNOSIS — M1990 Unspecified osteoarthritis, unspecified site: Secondary | ICD-10-CM | POA: Diagnosis not present

## 2019-07-20 DIAGNOSIS — K5900 Constipation, unspecified: Secondary | ICD-10-CM | POA: Diagnosis not present

## 2019-07-21 ENCOUNTER — Inpatient Hospital Stay: Payer: Medicare Other | Attending: Oncology | Admitting: Oncology

## 2019-07-21 ENCOUNTER — Other Ambulatory Visit: Payer: Self-pay

## 2019-07-21 ENCOUNTER — Encounter: Payer: Self-pay | Admitting: Oncology

## 2019-07-21 VITALS — BP 126/67 | HR 102 | Temp 97.4°F | Resp 18 | Wt 81.3 lb

## 2019-07-21 DIAGNOSIS — Z681 Body mass index (BMI) 19 or less, adult: Secondary | ICD-10-CM | POA: Insufficient documentation

## 2019-07-21 DIAGNOSIS — R590 Localized enlarged lymph nodes: Secondary | ICD-10-CM | POA: Insufficient documentation

## 2019-07-21 DIAGNOSIS — N184 Chronic kidney disease, stage 4 (severe): Secondary | ICD-10-CM | POA: Insufficient documentation

## 2019-07-21 DIAGNOSIS — K861 Other chronic pancreatitis: Secondary | ICD-10-CM | POA: Insufficient documentation

## 2019-07-21 DIAGNOSIS — I672 Cerebral atherosclerosis: Secondary | ICD-10-CM | POA: Insufficient documentation

## 2019-07-21 DIAGNOSIS — I6782 Cerebral ischemia: Secondary | ICD-10-CM | POA: Diagnosis not present

## 2019-07-21 DIAGNOSIS — R64 Cachexia: Secondary | ICD-10-CM | POA: Insufficient documentation

## 2019-07-21 DIAGNOSIS — Z87891 Personal history of nicotine dependence: Secondary | ICD-10-CM | POA: Insufficient documentation

## 2019-07-21 DIAGNOSIS — I313 Pericardial effusion (noninflammatory): Secondary | ICD-10-CM | POA: Insufficient documentation

## 2019-07-21 DIAGNOSIS — Z794 Long term (current) use of insulin: Secondary | ICD-10-CM | POA: Insufficient documentation

## 2019-07-21 DIAGNOSIS — R531 Weakness: Secondary | ICD-10-CM | POA: Insufficient documentation

## 2019-07-21 DIAGNOSIS — E039 Hypothyroidism, unspecified: Secondary | ICD-10-CM | POA: Insufficient documentation

## 2019-07-21 DIAGNOSIS — N200 Calculus of kidney: Secondary | ICD-10-CM | POA: Insufficient documentation

## 2019-07-21 DIAGNOSIS — Z818 Family history of other mental and behavioral disorders: Secondary | ICD-10-CM | POA: Insufficient documentation

## 2019-07-21 DIAGNOSIS — J439 Emphysema, unspecified: Secondary | ICD-10-CM | POA: Diagnosis not present

## 2019-07-21 DIAGNOSIS — R569 Unspecified convulsions: Secondary | ICD-10-CM | POA: Diagnosis not present

## 2019-07-21 DIAGNOSIS — R5383 Other fatigue: Secondary | ICD-10-CM | POA: Insufficient documentation

## 2019-07-21 DIAGNOSIS — Z833 Family history of diabetes mellitus: Secondary | ICD-10-CM | POA: Insufficient documentation

## 2019-07-21 DIAGNOSIS — I129 Hypertensive chronic kidney disease with stage 1 through stage 4 chronic kidney disease, or unspecified chronic kidney disease: Secondary | ICD-10-CM | POA: Diagnosis not present

## 2019-07-21 DIAGNOSIS — E1122 Type 2 diabetes mellitus with diabetic chronic kidney disease: Secondary | ICD-10-CM | POA: Insufficient documentation

## 2019-07-21 DIAGNOSIS — R4182 Altered mental status, unspecified: Secondary | ICD-10-CM | POA: Diagnosis not present

## 2019-07-21 DIAGNOSIS — R29810 Facial weakness: Secondary | ICD-10-CM | POA: Insufficient documentation

## 2019-07-21 DIAGNOSIS — Z808 Family history of malignant neoplasm of other organs or systems: Secondary | ICD-10-CM | POA: Insufficient documentation

## 2019-07-21 DIAGNOSIS — R911 Solitary pulmonary nodule: Secondary | ICD-10-CM | POA: Insufficient documentation

## 2019-07-21 DIAGNOSIS — Z79899 Other long term (current) drug therapy: Secondary | ICD-10-CM | POA: Diagnosis not present

## 2019-07-21 DIAGNOSIS — R609 Edema, unspecified: Secondary | ICD-10-CM | POA: Diagnosis not present

## 2019-07-21 DIAGNOSIS — J90 Pleural effusion, not elsewhere classified: Secondary | ICD-10-CM | POA: Diagnosis not present

## 2019-07-21 DIAGNOSIS — Z8249 Family history of ischemic heart disease and other diseases of the circulatory system: Secondary | ICD-10-CM | POA: Insufficient documentation

## 2019-07-21 NOTE — Progress Notes (Signed)
Patient states she has no appetite and has not been eating a lot. She reports no pain. States she is having some nausea and took zofran this morning. She is here today with daughter.

## 2019-07-22 ENCOUNTER — Other Ambulatory Visit: Payer: Self-pay

## 2019-07-22 ENCOUNTER — Encounter: Payer: Self-pay | Admitting: Pulmonary Disease

## 2019-07-22 ENCOUNTER — Inpatient Hospital Stay: Payer: Self-pay | Admitting: Physician Assistant

## 2019-07-22 ENCOUNTER — Ambulatory Visit: Payer: Medicare Other | Admitting: Pulmonary Disease

## 2019-07-22 ENCOUNTER — Other Ambulatory Visit: Payer: Self-pay | Admitting: *Deleted

## 2019-07-22 VITALS — BP 112/58 | HR 80 | Temp 97.1°F | Ht 66.0 in | Wt 84.2 lb

## 2019-07-22 DIAGNOSIS — M1990 Unspecified osteoarthritis, unspecified site: Secondary | ICD-10-CM | POA: Diagnosis not present

## 2019-07-22 DIAGNOSIS — E039 Hypothyroidism, unspecified: Secondary | ICD-10-CM | POA: Diagnosis not present

## 2019-07-22 DIAGNOSIS — N390 Urinary tract infection, site not specified: Secondary | ICD-10-CM | POA: Diagnosis not present

## 2019-07-22 DIAGNOSIS — R64 Cachexia: Secondary | ICD-10-CM

## 2019-07-22 DIAGNOSIS — N184 Chronic kidney disease, stage 4 (severe): Secondary | ICD-10-CM | POA: Diagnosis not present

## 2019-07-22 DIAGNOSIS — R590 Localized enlarged lymph nodes: Secondary | ICD-10-CM

## 2019-07-22 DIAGNOSIS — J438 Other emphysema: Secondary | ICD-10-CM

## 2019-07-22 DIAGNOSIS — E1151 Type 2 diabetes mellitus with diabetic peripheral angiopathy without gangrene: Secondary | ICD-10-CM | POA: Diagnosis not present

## 2019-07-22 DIAGNOSIS — M719 Bursopathy, unspecified: Secondary | ICD-10-CM | POA: Diagnosis not present

## 2019-07-22 DIAGNOSIS — I129 Hypertensive chronic kidney disease with stage 1 through stage 4 chronic kidney disease, or unspecified chronic kidney disease: Secondary | ICD-10-CM | POA: Diagnosis not present

## 2019-07-22 DIAGNOSIS — R918 Other nonspecific abnormal finding of lung field: Secondary | ICD-10-CM

## 2019-07-22 DIAGNOSIS — K5900 Constipation, unspecified: Secondary | ICD-10-CM | POA: Diagnosis not present

## 2019-07-22 DIAGNOSIS — E1122 Type 2 diabetes mellitus with diabetic chronic kidney disease: Secondary | ICD-10-CM | POA: Diagnosis not present

## 2019-07-22 DIAGNOSIS — D631 Anemia in chronic kidney disease: Secondary | ICD-10-CM | POA: Diagnosis not present

## 2019-07-22 DIAGNOSIS — M1030 Gout due to renal impairment, unspecified site: Secondary | ICD-10-CM | POA: Diagnosis not present

## 2019-07-22 DIAGNOSIS — J309 Allergic rhinitis, unspecified: Secondary | ICD-10-CM | POA: Diagnosis not present

## 2019-07-22 MED ORDER — ALBUTEROL SULFATE HFA 108 (90 BASE) MCG/ACT IN AERS: 2.0000 | INHALATION_SPRAY | Freq: Four times a day (QID) | RESPIRATORY_TRACT | 5 refills | Status: AC | PRN

## 2019-07-22 NOTE — Progress Notes (Signed)
 Assessment & Plan:  1. Lung mass (Primary)  2. Mediastinal adenopathy  3. Other emphysema (HCC)  4. Cachexia   Patient Instructions  We are going to present her case at tumor board   We are sending a prescription for albuterol  to use as needed for wheezing   You will have a follow-up appointment in 3 to 4 weeks time with me or the nurse practitioner  Please note: late entry documentation due to logistical difficulties during COVID-19 pandemic. This note is filed for information purposes only, and is not intended to be used for billing, nor does it represent the full scope/nature of the visit in question. Please see any associated scanned media linked to date of encounter for additional pertinent information.  Subjective:    HPI: Caitlyn Marshall is a 79 y.o. female presenting to the pulmonology clinic on 07/22/2019 with report of: Consult (no c/o cough, SOB or whezzing )     Outpatient Encounter Medications as of 07/22/2019  Medication Sig   amLODipine  (NORVASC ) 10 MG tablet Take 10 mg by mouth daily.   ascorbic acid  (VITAMIN C) 500 MG tablet Take 500 mg by mouth daily.   doxazosin  (CARDURA ) 4 MG tablet Take 4 mg by mouth at bedtime.   levothyroxine  (SYNTHROID , LEVOTHROID) 50 MCG tablet Take 1 tablet by mouth daily.   lovastatin (MEVACOR) 20 MG tablet TAKE 1 TABLET BY MOUTH DAILY   megestrol  (MEGACE ) 20 MG tablet Take 1 tablet (20 mg total) by mouth daily.   montelukast  (SINGULAIR ) 10 MG tablet TAKE 1 TABLET BY MOUTH EVERY DAY   ondansetron  (ZOFRAN ) 4 MG tablet Take 4 mg by mouth every 8 (eight) hours as needed for nausea or vomiting.    [DISCONTINUED] allopurinol  (ZYLOPRIM ) 100 MG tablet TAKE 2 TABLETS(200 MG) BY MOUTH TWICE DAILY   [DISCONTINUED] Cholecalciferol  (VITAMIN D -3) 125 MCG (5000 UT) TABS Take 5,000 Units by mouth daily.   [DISCONTINUED] cyproheptadine  (PERIACTIN ) 4 MG tablet Start 1/2 tablet three times daily for one week, then increase to 1 tablet three  times daily (Patient taking differently: Take 4 mg by mouth 3 (three) times daily. Start 1/2 tablet three times daily for one week, then increase to 1 tablet three times daily)   [DISCONTINUED] donepezil  (ARICEPT ) 5 MG tablet Take 1 tablet (5 mg total) by mouth at bedtime.   [DISCONTINUED] hydrochlorothiazide  (HYDRODIURIL ) 12.5 MG tablet TK 1 T PO QAM UTD FOR LEG SWELLING   [DISCONTINUED] insulin  NPH Human (NOVOLIN  N) 100 UNIT/ML injection Inject 6-7 Units into the skin See admin instructions. Inject 6 units subcutaneously before breakfast and 7 units before supper   [DISCONTINUED] insulin  regular (NOVOLIN  R,HUMULIN R ) 100 units/mL injection Inject 6-10 Units into the skin See admin instructions. Inject 6 units subcutaneously with breakfast, 8 units with lunch and 10 units with supper   [DISCONTINUED] Insulin  Syringe-Needle U-100 (INSULIN  SYRINGE .5CC/30GX5/16) 30G X 5/16 0.5 ML MISC U QID UTD   [DISCONTINUED] irbesartan (AVAPRO) 300 MG tablet Take 300 mg by mouth every evening.   [DISCONTINUED] mirtazapine  (REMERON ) 7.5 MG tablet Take 7.5 mg by mouth at bedtime.   [DISCONTINUED] ONE TOUCH ULTRA TEST test strip TEST THREE TIMES DAILY   [DISCONTINUED] ONETOUCH DELICA LANCETS 33G MISC INJECT AS DIRECTED THREE TIMES DAILY   [DISCONTINUED] potassium chloride  SA (K-DUR,KLOR-CON ) 20 MEQ tablet Take 1 tablet by mouth daily.   [DISCONTINUED] torsemide (DEMADEX) 20 MG tablet Take 20 mg by mouth daily.    albuterol  (VENTOLIN  HFA) 108 (90 Base) MCG/ACT  inhaler Inhale 2 puffs into the lungs every 6 (six) hours as needed for wheezing or shortness of breath.   [DISCONTINUED] ALPRAZolam  (XANAX ) 0.25 MG tablet Take 1 tablet (0.25 mg total) by mouth every 6 (six) hours as needed. (Patient not taking: Reported on 07/22/2019)   [DISCONTINUED] aspirin  EC 81 MG tablet Take 81 mg by mouth daily.    No facility-administered encounter medications on file as of 07/22/2019.      Objective:   Vitals:   07/22/19 1407   BP: (!) 112/58  Pulse: 80  Temp: (!) 97.1 F (36.2 C)  Height: 5' 6 (1.676 m)  Weight: 84 lb 3.2 oz (38.2 kg)  SpO2: 98%  TempSrc: Temporal  BMI (Calculated): 13.6     Physical exam documentation is limited by delayed entry of information.

## 2019-07-22 NOTE — Patient Instructions (Signed)
We are going to present her case at tumor board   We are sending a prescription for albuterol to use as needed for wheezing   You will have a follow-up appointment in 3 to 4 weeks time with me or the nurse practitioner

## 2019-07-23 ENCOUNTER — Encounter: Payer: Self-pay | Admitting: Physician Assistant

## 2019-07-23 ENCOUNTER — Ambulatory Visit (INDEPENDENT_AMBULATORY_CARE_PROVIDER_SITE_OTHER): Payer: Medicare Other | Admitting: Physician Assistant

## 2019-07-23 ENCOUNTER — Other Ambulatory Visit: Payer: Self-pay

## 2019-07-23 VITALS — BP 144/66 | HR 85 | Temp 97.1°F | Resp 16 | Wt 86.0 lb

## 2019-07-23 DIAGNOSIS — R630 Anorexia: Secondary | ICD-10-CM

## 2019-07-23 DIAGNOSIS — E1029 Type 1 diabetes mellitus with other diabetic kidney complication: Secondary | ICD-10-CM | POA: Diagnosis not present

## 2019-07-23 DIAGNOSIS — I739 Peripheral vascular disease, unspecified: Secondary | ICD-10-CM

## 2019-07-23 DIAGNOSIS — I129 Hypertensive chronic kidney disease with stage 1 through stage 4 chronic kidney disease, or unspecified chronic kidney disease: Secondary | ICD-10-CM | POA: Diagnosis not present

## 2019-07-23 DIAGNOSIS — N184 Chronic kidney disease, stage 4 (severe): Secondary | ICD-10-CM

## 2019-07-23 DIAGNOSIS — R413 Other amnesia: Secondary | ICD-10-CM | POA: Diagnosis not present

## 2019-07-23 DIAGNOSIS — F439 Reaction to severe stress, unspecified: Secondary | ICD-10-CM

## 2019-07-23 DIAGNOSIS — K5904 Chronic idiopathic constipation: Secondary | ICD-10-CM

## 2019-07-23 DIAGNOSIS — Z7689 Persons encountering health services in other specified circumstances: Secondary | ICD-10-CM

## 2019-07-23 DIAGNOSIS — E034 Atrophy of thyroid (acquired): Secondary | ICD-10-CM

## 2019-07-23 MED ORDER — FREESTYLE LIBRE 14 DAY SENSOR MISC: 5 refills | Status: DC

## 2019-07-23 MED ORDER — INSULIN NPH (HUMAN) (ISOPHANE) 100 UNIT/ML ~~LOC~~ SUSP: SUBCUTANEOUS | 11 refills | Status: DC

## 2019-07-23 MED ORDER — NOVOLIN N FLEXPEN 100 UNIT/ML ~~LOC~~ SUPN: PEN_INJECTOR | SUBCUTANEOUS | 11 refills | Status: DC

## 2019-07-23 MED ORDER — DONEPEZIL HCL 10 MG PO TABS: 10.0000 mg | ORAL_TABLET | Freq: Every day | ORAL | 1 refills | Status: AC

## 2019-07-23 MED ORDER — FREESTYLE LIBRE 14 DAY READER DEVI: 0 refills | Status: DC

## 2019-07-23 MED ORDER — MIRTAZAPINE 15 MG PO TABS: 15.0000 mg | ORAL_TABLET | Freq: Every day | ORAL | 1 refills | Status: AC

## 2019-07-23 MED ORDER — INSULIN REGULAR HUMAN 100 UNIT/ML IJ SOLN: INTRAMUSCULAR | 11 refills | Status: DC

## 2019-07-23 MED ORDER — ALPRAZOLAM 0.25 MG PO TABS: 0.2500 mg | ORAL_TABLET | Freq: Four times a day (QID) | ORAL | 1 refills | Status: DC | PRN

## 2019-07-23 MED ORDER — NOVOLIN R FLEXPEN 100 UNIT/ML IJ SOPN: PEN_INJECTOR | INTRAMUSCULAR | 12 refills | Status: DC

## 2019-07-23 MED ORDER — LINACLOTIDE 145 MCG PO CAPS: 145.0000 ug | ORAL_CAPSULE | Freq: Every day | ORAL | 1 refills | Status: AC

## 2019-07-23 NOTE — Progress Notes (Signed)
Established patient visit   Patient: Caitlyn Marshall   DOB: January 11, 1941   79 y.o. Female  MRN: [id number] Visit Date: 07/23/2019  Today's healthcare provider: Mar Daring, PA-C   Chief Complaint  Patient presents with  . Hospitalization Follow-up   Subjective    HPI Follow up Hospitalization Patient here with daughter, Caitlyn Marshall. Caitlyn Marshall is HCPOA but resides in [place]. Caitlyn Marshall lives with her son locally.  Patient was admitted to [facility] on 07/08/2019 and discharged on 07/14/2019 She was treated for Altered Mental Status Treatment for this included labs,urine,MRI and CT Telephone follow up was done on 07/15/19 She reports good compliance with treatment. She reports this condition is improved. She had albuterol inhaler prescribed yesterday by pulmonology.  Daughter did not bring medications with her today, but feels her medications are not accurate for what we have on file vs what she has at home. ----------------------------------------------------------------------------------------- -  Patient Active Problem List   Diagnosis Date Noted  . Altered mental status 07/08/2019  . Diabetic retinopathy ([place]) 02/23/2018  . Nodule of lower lobe of left lung 04/17/2017  . Insomnia 07/12/2015  . Hypertension 07/12/2015  . Poor appetite 07/12/2015  . Constipation 07/12/2015  . Bursitis 05/31/2015  . 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 Leg swelling 05/31/2015  . Allergic rhinitis 12/26/2014  . Vitamin D deficiency 12/20/2014  . Anxiety 12/20/2014  . Hypercholesterolemia 12/20/2014  . Anemia 12/20/2014  . Hypothyroidism 12/20/2014  . Hypertensive CKD (chronic kidney disease) 12/20/2014  . Premature ventricular contraction 12/20/2014  . PVD (peripheral vascular disease) ([place]) 12/20/2014  . Gout 12/20/2014  . Osteoarthritis 12/20/2014  . Renal insufficiency 12/20/2014   Past Medical History:    Diagnosis Date  . Chronic kidney disease   . Diabetes mellitus without complication ([place])   . Gout   . Hyperlipidemia   . Hypertension   . Hypothyroidism   . Thyroid disease        Medications: Outpatient Medications Prior to Visit  Medication Sig  . albuterol (VENTOLIN HFA) 108 (90 Base) MCG/ACT inhaler Inhale 2 puffs into the lungs every 6 (six) hours as needed for wheezing or shortness of breath. (Patient not taking: Reported on 07/23/2019)  . allopurinol (ZYLOPRIM) 100 MG tablet TAKE 2 TABLETS(200 MG) BY MOUTH TWICE DAILY  . amLODipine (NORVASC) 10 MG tablet Take 10 mg by mouth daily.  Marland Kitchen ascorbic acid (VITAMIN C) 500 MG tablet Take 500 mg by mouth daily.  Marland Kitchen aspirin EC 81 MG tablet Take 81 mg by mouth daily.   . Cholecalciferol (VITAMIN D-3) 125 MCG (5000 UT) TABS Take 5,000 Units by mouth daily.  Marland Kitchen doxazosin (CARDURA) 4 MG tablet Take 4 mg by mouth at bedtime.  . insulin NPH Human (NOVOLIN N) 100 UNIT/ML injection Inject 6-7 Units into the skin See admin instructions. Inject 6 units subcutaneously before breakfast and 7 units before supper  . insulin regular (NOVOLIN R,HUMULIN R) 100 units/mL injection Inject 6-10 Units into the skin See admin instructions. Inject 6 units subcutaneously with breakfast, 8 units with lunch and 10 units with supper  . Insulin Syringe-Needle U-100 (INSULIN SYRINGE .5CC/30GX5/16") 30G X 5/16" 0.5 ML MISC U QID UTD  . irbesartan (AVAPRO) 300 MG tablet Take 300 mg by mouth every evening.  Marland Kitchen levothyroxine (SYNTHROID, LEVOTHROID) 50 MCG tablet Take 1 tablet by mouth daily.  Marland Kitchen lovastatin (MEVACOR) 20 MG tablet TAKE 1 TABLET BY MOUTH DAILY  . megestrol (  MEGACE) 20 MG tablet Take 1 tablet (20 mg total) by mouth daily.  . montelukast (SINGULAIR) 10 MG tablet TAKE 1 TABLET BY MOUTH EVERY DAY  . ondansetron (ZOFRAN) 4 MG tablet Take 4 mg by mouth every 8 (eight) hours as needed for nausea or vomiting.   . ONE TOUCH ULTRA TEST test strip TEST THREE TIMES DAILY  .  ONETOUCH DELICA LANCETS 73X MISC INJECT AS DIRECTED THREE TIMES DAILY  . [DISCONTINUED] ALPRAZolam (XANAX) 0.25 MG tablet Take 1 tablet (0.25 mg total) by mouth every 6 (six) hours as needed. (Patient not taking: Reported on 07/22/2019)  . [DISCONTINUED] cyproheptadine (PERIACTIN) 4 MG tablet Start 1/2 tablet three times daily for one week, then increase to 1 tablet three times daily (Patient taking differently: Take 4 mg by mouth 3 (three) times daily. Start 1/2 tablet three times daily for one week, then increase to 1 tablet three times daily)  . [DISCONTINUED] donepezil (ARICEPT) 5 MG tablet Take 1 tablet (5 mg total) by mouth at bedtime.  . [DISCONTINUED] hydrochlorothiazide (HYDRODIURIL) 12.5 MG tablet TK 1 T PO QAM UTD FOR LEG SWELLING  . [DISCONTINUED] mirtazapine (REMERON) 7.5 MG tablet Take 7.5 mg by mouth at bedtime.  . [DISCONTINUED] potassium chloride SA (K-DUR,KLOR-CON) 20 MEQ tablet Take 1 tablet by mouth daily.  . [DISCONTINUED] torsemide (DEMADEX) 20 MG tablet Take 20 mg by mouth daily.    No facility-administered medications prior to visit.    Review of Systems  Constitutional: Negative.   Respiratory: Negative.   Cardiovascular: Negative.   Genitourinary: Negative.   Neurological: Negative.     Last CBC Lab Results  Component Value Date   WBC 8.5 07/23/2019   HGB 11.4 07/23/2019   HCT 36.5 07/23/2019   MCV 82 07/23/2019   MCH 25.5 (L) 07/23/2019   RDW 18.2 (H) 07/23/2019   PLT 307 10/62/6948   Last metabolic panel Lab Results  Component Value Date   GLUCOSE 190 (H) 07/23/2019   NA 141 07/23/2019   K 4.5 07/23/2019   CL 103 07/23/2019   CO2 24 07/23/2019   BUN 17 07/23/2019   CREATININE 1.59 (H) 07/23/2019   GFRNONAA 31 (L) 07/23/2019   GFRAA 36 (L) 07/23/2019   CALCIUM 9.6 07/23/2019   PHOS 3.9 09/07/2018   PROT 6.4 (L) 07/10/2019   ALBUMIN 3.2 (L) 07/10/2019   LABGLOB 2.5 05/31/2019   AGRATIO 1.6 05/31/2019   BILITOT 0.9 07/10/2019   ALKPHOS 81  07/10/2019   AST 21 07/10/2019   ALT 12 07/10/2019   ANIONGAP 8 07/13/2019   Last lipids Lab Results  Component Value Date   CHOL 131 10/03/2016   HDL 65 10/03/2016   LDLCALC 49 10/03/2016   TRIG 87 10/03/2016   CHOLHDL 2.0 10/03/2016   Last hemoglobin A1c Lab Results  Component Value Date   HGBA1C 7.9 (H) 07/08/2019   Last thyroid functions Lab Results  Component Value Date   TSH 2.856 07/10/2019   Last vitamin D Lab Results  Component Value Date   VD25OH 8.2 (L) 11/12/2017   Last vitamin B12 and Folate Lab Results  Component Value Date   VITAMINB12 810 07/09/2019   FOLATE 13.1 07/09/2019      Objective    BP (!) 144/66 (BP Location: Left Arm, Patient Position: Sitting, Cuff Size: Small)   Pulse 85   Temp (!) 97.1 F (36.2 C) (Temporal)   Resp 16   Wt 86 lb (39 kg)   BMI 13.88 kg/m  BP  Readings from Last 3 Encounters:  07/23/19 (!) 144/66  07/22/19 (!) 112/58  07/21/19 126/67   Wt Readings from Last 3 Encounters:  07/23/19 86 lb (39 kg)  07/22/19 84 lb 3.2 oz (38.2 kg)  07/21/19 81 lb 4.8 oz (36.9 kg)   SpO2 Readings from Last 3 Encounters:  07/22/19 98%  07/21/19 99%  07/14/19 93%      Physical Exam Vitals reviewed.  Constitutional:      General: She is not in acute distress.    Appearance: Normal appearance. She is well-developed and underweight. She is not ill-appearing or diaphoretic.  Neck:     Thyroid: No thyromegaly.     Vascular: No JVD.     Trachea: No tracheal deviation.  Cardiovascular:     Rate and Rhythm: Normal rate and regular rhythm.     Pulses: Normal pulses.     Heart sounds: Murmur present. No friction rub. No gallop.   Pulmonary:     Effort: Pulmonary effort is normal. No respiratory distress.     Breath sounds: Normal breath sounds. No wheezing or rales.  Musculoskeletal:     Cervical back: Normal range of motion and neck supple.     Right lower leg: No edema.     Left lower leg: No edema.  Lymphadenopathy:      Cervical: No cervical adenopathy.  Neurological:     Mental Status: She is alert.  Psychiatric:        Mood and Affect: Mood normal.      Results for orders placed or performed in visit on 07/23/19  CBC w/Diff/Platelet  Result Value Ref Range   WBC 8.5 3.4 - 10.8 x10E3/uL   RBC 4.47 3.77 - 5.28 x10E6/uL   Hemoglobin 11.4 11.1 - 15.9 g/dL   Hematocrit 36.5 34.0 - 46.6 %   MCV 82 79 - 97 fL   MCH 25.5 (L) 26.6 - 33.0 pg   MCHC 31.2 (L) 31.5 - 35.7 g/dL   RDW 18.2 (H) 11.7 - 15.4 %   Platelets 307 150 - 450 x10E3/uL   Neutrophils 70 Not Estab. %   Lymphs 17 Not Estab. %   Monocytes 7 Not Estab. %   Eos 4 Not Estab. %   Basos 1 Not Estab. %   Neutrophils Absolute 6.0 1.4 - 7.0 x10E3/uL   Lymphocytes Absolute 1.4 0.7 - 3.1 x10E3/uL   Monocytes Absolute 0.6 0.1 - 0.9 x10E3/uL   EOS (ABSOLUTE) 0.3 0.0 - 0.4 x10E3/uL   Basophils Absolute 0.1 0.0 - 0.2 x10E3/uL   Immature Granulocytes 1 Not Estab. %   Immature Grans (Abs) 0.0 0.0 - 0.1 X32T5/TD  Basic Metabolic Panel (BMET)  Result Value Ref Range   Glucose 190 (H) 65 - 99 mg/dL   BUN 17 8 - 27 mg/dL   Creatinine, Ser 1.59 (H) 0.57 - 1.00 mg/dL   GFR calc non Af Amer 31 (L) >59 mL/min/1.73   GFR calc Af Amer 36 (L) >59 mL/min/1.73   BUN/Creatinine Ratio 11 (L) 12 - 28   Sodium 141 134 - 144 mmol/L   Potassium 4.5 3.5 - 5.2 mmol/L   Chloride 103 96 - 106 mmol/L   CO2 24 20 - 29 mmol/L   Calcium 9.6 8.7 - 10.3 mg/dL    Assessment & Plan     1. Encounter for support and coordination of transition of care Notes, consults, imaging, and labs from hospitalization from 4/22-4/28 were all reviewed prior to patient arrival. Telephone f/u was  done on 4/29.   2. Decreased appetite Continues to have poor intake. Discussed adding boost or ensure between meals. Increase mirtazapine from 7.5mg  to 15mg  as noted below. Referral placed for [facility] for PT and nursing.  Will check labs as below and f/u pending results. - mirtazapine (REMERON) 15  MG tablet; Take 1 tablet (15 mg total) by mouth at bedtime.  Dispense: 90 tablet; Refill: 1 - Ambulatory referral to [place] w/Diff/Platelet - Basic Metabolic Panel (BMET)  3. Memory loss Donepezil had been increased, then family had wanted to decrease, but then called back and wanted to continue the 10mg  as they had seen subtle improvements. Donepezil 10mg  refilled.  - donepezil (ARICEPT) 10 MG tablet; Take 1 tablet (10 mg total) by mouth at bedtime.  Dispense: 90 tablet; Refill: 1 - Ambulatory referral to [place] w/Diff/Platelet - Basic Metabolic Panel (BMET)  4. 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) Daughter unsure what she has at home. Patient is followed by Endocrinology, Dr. Gabriel Carina. Per Dr. Gabriel Carina most recent note, patient had not wanted insulin pens and was using syringes to draw up. Family unsure what or when to dose. Daughter requesting changes to insulin pen devices for ease of use. These were prescribed as below.  - Ambulatory referral to [place] - CBC w/Diff/Platelet - Basic Metabolic Panel (BMET) - insulin NPH Human (HUMULIN N) 100 UNIT/ML injection; Inject 6-7 Units into the skin See admin instructions. Inject 6 units subcutaneously before breakfast and 7 units before supper  Dispense: 10 mL; Refill: 11 - insulin regular (HUMULIN R) 100 units/mL injection; Inject 6 units with breakfast, 8 units with lunch and 10 units with supper  Dispense: 10 mL; Refill: 11  5. Stage 4 chronic kidney disease (HCC) Stable. Continue to push fluids. Will recheck labs as below.  - Ambulatory referral to [place] w/Diff/Platelet - Basic Metabolic Panel (BMET)  6. Hypertensive kidney disease with stage 4 chronic kidney disease ([place]) See above medical treatment plan. - Ambulatory referral to [place] w/Diff/Platelet - Basic Metabolic Panel (BMET)  7. Situational stress Worsening per daughter. Alprazolam refilled. May f/u  with Dr. Caryn Section for further refills and following appropriateness. It appeared he was trying to discontinue.  - ALPRAZolam (XANAX) 0.25 MG tablet; Take 1 tablet (0.25 mg total) by mouth every 6 (six) hours as needed.  Dispense: 30 tablet; Refill: 1 - Ambulatory referral to [place] w/Diff/Platelet - Basic Metabolic Panel (BMET)  8. Chronic idiopathic constipation Worsening. Has used Linzess in the past successfully. Will send in new prescription. Call if not helping resolve constipation.  - Ambulatory referral to [place] - CBC w/Diff/Platelet - Basic Metabolic Panel (BMET) - linaclotide (LINZESS) 145 MCG CAPS capsule; Take 1 capsule (145 mcg total) by mouth daily before breakfast.  Dispense: 90 capsule; Refill: 1  9. PVD (peripheral vascular disease) (HCC) Chronic. Currently not having rest pain or signs of ischemia.  - Ambulatory referral to [place] w/Diff/Platelet - Basic Metabolic Panel (BMET)  10. Hypothyroidism due to acquired atrophy of thyroid Followed by Dr. Gabriel Carina, endocrinology. Suppose to be taking levothyroxine 58mcg. Daughter unsure. HH for medication management.  - Ambulatory referral to [place] w/Diff/Platelet - Basic Metabolic Panel (BMET)  Return in about 4 weeks (around 08/20/2019) for Dr. Caryn Section.      Reynolds Bowl, PA-C, have reviewed all documentation for this visit. The documentation on  07/28/19 for the exam, diagnosis, procedures, and orders are all accurate and complete.   [name]  [facility] [phone] (phone) [phone] (fax)  [place]

## 2019-07-23 NOTE — Patient Instructions (Signed)
Dementia Caregiver Guide Dementia is a term used to describe a number of symptoms that affect memory and thinking. The most common symptoms include:  Memory loss.  Trouble with language and communication.  Trouble concentrating.  Poor judgment.  Problems with reasoning.  Child-like behavior and language.  Extreme anxiety.  Angry outbursts.  Wandering from home or public places. Dementia usually gets worse slowly over time. In the early stages, people with dementia can stay independent and safe with some help. In later stages, they need help with daily tasks such as dressing, grooming, and using the bathroom. How to help the person with dementia cope Dementia can be frightening and confusing. Here are some tips to help the person with dementia cope with changes caused by the disease. General tips  Keep the person on track with his or her routine.  Try to identify areas where the person may need help.  Be supportive, patient, calm, and encouraging.  Gently remind the person that adjusting to changes takes time.  Help with the tasks that the person has asked for help with.  Keep the person involved in daily tasks and decisions as much as possible.  Encourage conversation, but try not to get frustrated or harried if the person struggles to find words or does not seem to appreciate your help. Communication tips  When the person is talking or seems frustrated, make eye contact and hold the person's hand.  Ask specific questions that need yes or no answers.  Use simple words, short sentences, and a calm voice. Only give one direction at a time.  When offering choices, limit them to just 1 or 2.  Avoid correcting the person in a negative way.  If the person is struggling to find the right words, gently try to help him or her. How to recognize symptoms of stress Symptoms of stress in caregivers include:  Feeling frustrated or angry with the person with  dementia.  Denying that the person has dementia or that his or her symptoms will not improve.  Feeling hopeless and unappreciated.  Difficulty sleeping.  Difficulty concentrating.  Feeling anxious, irritable, or depressed.  Developing stress-related health problems.  Feeling like you have too little time for your own life. Follow these instructions at home:   Make sure that you and the person you are caring for: ? Get regular sleep. ? Exercise regularly. ? Eat regular, nutritious meals. ? Drink enough fluid to keep your urine clear or pale yellow. ? Take over-the-counter and prescription medicines only as told by your health care providers. ? Attend all scheduled health care appointments.  Join a support group with others who are caregivers.  Ask about respite care resources so that you can have a regular break from the stress of caregiving.  Look for signs of stress in yourself and in the person you are caring for. If you notice signs of stress, take steps to manage it.  Consider any safety risks and take steps to avoid them.  Organize medications in a pill box for each day of the week.  Create a plan to handle any legal or financial matters. Get legal or financial advice if needed.  Keep a calendar in a central location to remind the person of appointments or other activities. Tips for reducing the risk of injury  Keep floors clear of clutter. Remove rugs, magazine racks, and floor lamps.  Keep hallways well lit, especially at night.  Put a handrail and nonslip mat in the bathtub or  shower.  Put childproof locks on cabinets that contain dangerous items, such as medicines, alcohol, guns, toxic cleaning items, sharp tools or utensils, matches, and lighters.  Put the locks in places where the person cannot see or reach them easily. This will help ensure that the person does not wander out of the house and get lost.  Be prepared for emergencies. Keep a list of  emergency phone numbers and addresses in a convenient area.  Remove car keys and lock garage doors so that the person does not try to get in the car and drive.  Have the person wear a bracelet that tracks locations and identifies the person as having memory problems. This should be worn at all times for safety. Where to find support: Many individuals and organizations offer support. These include:  Support groups for people with dementia and for caregivers.  Counselors or therapists.  Home health care services.  Adult day care centers. Where to find more information [employer]: [employer] Contact a health care provider if:  The person's health is rapidly getting worse.  You are no longer able to care for the person.  Caring for the person is affecting your physical and emotional health.  The person threatens himself or herself, you, or anyone else. Summary  Dementia is a term used to describe a number of symptoms that affect memory and thinking.  Dementia usually gets worse slowly over time.  Take steps to reduce the person's risk of injury, and to plan for future care.  Caregivers need support, relief from caregiving, and time for their own lives. This information is not intended to replace advice given to you by your health care provider. Make sure you discuss any questions you have with your health care provider. Document Revised: 02/14/2017 Document Reviewed: 02/06/2016 Elsevier Patient Education  2020 [employer].

## 2019-07-24 LAB — CBC WITH DIFFERENTIAL/PLATELET
Basophils Absolute: 0.1 10*3/uL (ref 0.0–0.2)
Basos: 1 %
EOS (ABSOLUTE): 0.3 10*3/uL (ref 0.0–0.4)
Eos: 4 %
Hematocrit: 36.5 % (ref 34.0–46.6)
Hemoglobin: 11.4 g/dL (ref 11.1–15.9)
Immature Grans (Abs): 0 10*3/uL (ref 0.0–0.1)
Immature Granulocytes: 1 %
Lymphocytes Absolute: 1.4 10*3/uL (ref 0.7–3.1)
Lymphs: 17 %
MCH: 25.5 pg — ABNORMAL LOW (ref 26.6–33.0)
MCHC: 31.2 g/dL — ABNORMAL LOW (ref 31.5–35.7)
MCV: 82 fL (ref 79–97)
Monocytes Absolute: 0.6 10*3/uL (ref 0.1–0.9)
Monocytes: 7 %
Neutrophils Absolute: 6 10*3/uL (ref 1.4–7.0)
Neutrophils: 70 %
Platelets: 307 10*3/uL (ref 150–450)
RBC: 4.47 x10E6/uL (ref 3.77–5.28)
RDW: 18.2 % — ABNORMAL HIGH (ref 11.7–15.4)
WBC: 8.5 10*3/uL (ref 3.4–10.8)

## 2019-07-24 LAB — BASIC METABOLIC PANEL
BUN/Creatinine Ratio: 11 — ABNORMAL LOW (ref 12–28)
BUN: 17 mg/dL (ref 8–27)
CO2: 24 mmol/L (ref 20–29)
Calcium: 9.6 mg/dL (ref 8.7–10.3)
Chloride: 103 mmol/L (ref 96–106)
Creatinine, Ser: 1.59 mg/dL — ABNORMAL HIGH (ref 0.57–1.00)
GFR calc Af Amer: 36 mL/min/{1.73_m2} — ABNORMAL LOW (ref >59)
GFR calc non Af Amer: 31 mL/min/{1.73_m2} — ABNORMAL LOW (ref >59)
Glucose: 190 mg/dL — ABNORMAL HIGH (ref 65–99)
Potassium: 4.5 mmol/L (ref 3.5–5.2)
Sodium: 141 mmol/L (ref 134–144)

## 2019-07-26 ENCOUNTER — Telehealth: Payer: Self-pay

## 2019-07-26 NOTE — Telephone Encounter (Signed)
Patient's daughter [name]) stated patient had a fall this morning. EMS was called and they checked patient out and patient vitals was good and she seemed normal. [name] states that she is concerned because patient has been sleeping a lot on and off. Also [name] gave patient a Xanax today as well. [name] states patient is complaining of right side pain and abdominal pain. [name] would like to know if she should do anything. Please advise.

## 2019-07-26 NOTE — Telephone Encounter (Signed)
Patient's daughter [name] advised and verbalized understanding.

## 2019-07-26 NOTE — Telephone Encounter (Signed)
-----   Message from Mar Daring, [place] sent at 07/26/2019 10:29 AM EDT ----- Blood count is normal. Kidney function continues to improve slowly. Continue to push fluids. Sodium, potassium and calcium are normal.

## 2019-07-26 NOTE — Telephone Encounter (Signed)
Patient's daughter [name]) was advised and states that she will continue to push fluids.

## 2019-07-26 NOTE — Telephone Encounter (Signed)
Both the xanax and the donepezil can make her sleepy. She might need to go back to 5mg  on the donepezil for a little while and avoid given her the alprazolam if possible. If the abdominal pain is getting severe then she should go to ER for evaluation. She can always schedule o.v. to have it evaluated if it is progressive.

## 2019-07-27 DIAGNOSIS — N184 Chronic kidney disease, stage 4 (severe): Secondary | ICD-10-CM | POA: Diagnosis not present

## 2019-07-27 DIAGNOSIS — I129 Hypertensive chronic kidney disease with stage 1 through stage 4 chronic kidney disease, or unspecified chronic kidney disease: Secondary | ICD-10-CM | POA: Diagnosis not present

## 2019-07-27 DIAGNOSIS — E1151 Type 2 diabetes mellitus with diabetic peripheral angiopathy without gangrene: Secondary | ICD-10-CM | POA: Diagnosis not present

## 2019-07-27 DIAGNOSIS — E1122 Type 2 diabetes mellitus with diabetic chronic kidney disease: Secondary | ICD-10-CM | POA: Diagnosis not present

## 2019-07-27 DIAGNOSIS — M1990 Unspecified osteoarthritis, unspecified site: Secondary | ICD-10-CM | POA: Diagnosis not present

## 2019-07-27 DIAGNOSIS — M719 Bursopathy, unspecified: Secondary | ICD-10-CM | POA: Diagnosis not present

## 2019-07-27 DIAGNOSIS — D631 Anemia in chronic kidney disease: Secondary | ICD-10-CM | POA: Diagnosis not present

## 2019-07-27 DIAGNOSIS — E039 Hypothyroidism, unspecified: Secondary | ICD-10-CM | POA: Diagnosis not present

## 2019-07-27 DIAGNOSIS — J309 Allergic rhinitis, unspecified: Secondary | ICD-10-CM | POA: Diagnosis not present

## 2019-07-27 DIAGNOSIS — M1030 Gout due to renal impairment, unspecified site: Secondary | ICD-10-CM | POA: Diagnosis not present

## 2019-07-27 DIAGNOSIS — N390 Urinary tract infection, site not specified: Secondary | ICD-10-CM | POA: Diagnosis not present

## 2019-07-27 DIAGNOSIS — K5900 Constipation, unspecified: Secondary | ICD-10-CM | POA: Diagnosis not present

## 2019-07-28 ENCOUNTER — Other Ambulatory Visit: Payer: Self-pay | Admitting: Family Medicine

## 2019-07-28 DIAGNOSIS — M719 Bursopathy, unspecified: Secondary | ICD-10-CM | POA: Diagnosis not present

## 2019-07-28 DIAGNOSIS — N184 Chronic kidney disease, stage 4 (severe): Secondary | ICD-10-CM | POA: Diagnosis not present

## 2019-07-28 DIAGNOSIS — M1030 Gout due to renal impairment, unspecified site: Secondary | ICD-10-CM | POA: Diagnosis not present

## 2019-07-28 DIAGNOSIS — E1122 Type 2 diabetes mellitus with diabetic chronic kidney disease: Secondary | ICD-10-CM | POA: Diagnosis not present

## 2019-07-28 DIAGNOSIS — E1029 Type 1 diabetes mellitus with other diabetic kidney complication: Secondary | ICD-10-CM

## 2019-07-28 DIAGNOSIS — E1151 Type 2 diabetes mellitus with diabetic peripheral angiopathy without gangrene: Secondary | ICD-10-CM | POA: Diagnosis not present

## 2019-07-28 DIAGNOSIS — E039 Hypothyroidism, unspecified: Secondary | ICD-10-CM | POA: Diagnosis not present

## 2019-07-28 DIAGNOSIS — K5900 Constipation, unspecified: Secondary | ICD-10-CM | POA: Diagnosis not present

## 2019-07-28 DIAGNOSIS — N390 Urinary tract infection, site not specified: Secondary | ICD-10-CM | POA: Diagnosis not present

## 2019-07-28 DIAGNOSIS — J309 Allergic rhinitis, unspecified: Secondary | ICD-10-CM | POA: Diagnosis not present

## 2019-07-28 DIAGNOSIS — M1990 Unspecified osteoarthritis, unspecified site: Secondary | ICD-10-CM | POA: Diagnosis not present

## 2019-07-28 DIAGNOSIS — I129 Hypertensive chronic kidney disease with stage 1 through stage 4 chronic kidney disease, or unspecified chronic kidney disease: Secondary | ICD-10-CM | POA: Diagnosis not present

## 2019-07-28 DIAGNOSIS — D631 Anemia in chronic kidney disease: Secondary | ICD-10-CM | POA: Diagnosis not present

## 2019-07-28 NOTE — Telephone Encounter (Signed)
Can insulin regular (NOVOLIN R,HUMULIN R) 100 units/mL injection And insulin NPH Human (HUMULIN N) 100 UNIT/ML injection Be sent to  [place], [place] Phone:  [phone]  Fax:  [phone]     The cost at [place] is lower

## 2019-07-29 ENCOUNTER — Other Ambulatory Visit: Payer: Medicare Other

## 2019-07-29 DIAGNOSIS — J309 Allergic rhinitis, unspecified: Secondary | ICD-10-CM | POA: Diagnosis not present

## 2019-07-29 DIAGNOSIS — M1990 Unspecified osteoarthritis, unspecified site: Secondary | ICD-10-CM | POA: Diagnosis not present

## 2019-07-29 DIAGNOSIS — M719 Bursopathy, unspecified: Secondary | ICD-10-CM | POA: Diagnosis not present

## 2019-07-29 DIAGNOSIS — N184 Chronic kidney disease, stage 4 (severe): Secondary | ICD-10-CM | POA: Diagnosis not present

## 2019-07-29 DIAGNOSIS — I129 Hypertensive chronic kidney disease with stage 1 through stage 4 chronic kidney disease, or unspecified chronic kidney disease: Secondary | ICD-10-CM | POA: Diagnosis not present

## 2019-07-29 DIAGNOSIS — E1151 Type 2 diabetes mellitus with diabetic peripheral angiopathy without gangrene: Secondary | ICD-10-CM | POA: Diagnosis not present

## 2019-07-29 DIAGNOSIS — E1122 Type 2 diabetes mellitus with diabetic chronic kidney disease: Secondary | ICD-10-CM | POA: Diagnosis not present

## 2019-07-29 DIAGNOSIS — E039 Hypothyroidism, unspecified: Secondary | ICD-10-CM | POA: Diagnosis not present

## 2019-07-29 DIAGNOSIS — N390 Urinary tract infection, site not specified: Secondary | ICD-10-CM | POA: Diagnosis not present

## 2019-07-29 DIAGNOSIS — D631 Anemia in chronic kidney disease: Secondary | ICD-10-CM | POA: Diagnosis not present

## 2019-07-29 DIAGNOSIS — K5900 Constipation, unspecified: Secondary | ICD-10-CM | POA: Diagnosis not present

## 2019-07-29 DIAGNOSIS — M1030 Gout due to renal impairment, unspecified site: Secondary | ICD-10-CM | POA: Diagnosis not present

## 2019-07-29 MED ORDER — INSULIN REGULAR HUMAN 100 UNIT/ML IJ SOLN: INTRAMUSCULAR | 11 refills | Status: DC

## 2019-07-29 MED ORDER — INSULIN NPH (HUMAN) (ISOPHANE) 100 UNIT/ML ~~LOC~~ SUSP: SUBCUTANEOUS | 11 refills | Status: DC

## 2019-07-29 MED ORDER — FREESTYLE LIBRE READER DEVI: 0 refills | Status: DC

## 2019-07-29 NOTE — Progress Notes (Signed)
Tumor Board Documentation  Caitlyn Marshall was presented by Dr [name] at our Tumor Board on 07/29/2019, which included representatives from medical oncology, radiation oncology, navigation, pathology, radiology, surgical, surgical oncology, internal medicine, pharmacy, palliative care, pulmonology, research.  Caitlyn Marshall currently presents as a current patient, for discussion with history of the following treatments: active survellience.  Additionally, we reviewed previous medical and familial history, history of present illness, and recent lab results along with all available histopathologic and imaging studies. The tumor board considered available treatment options and made the following recommendations: Biopsy(If condition allows)    The following procedures/referrals were also placed: No orders of the defined types were placed in this encounter.   Clinical Trial Status: not discussed   Staging used: To be determined  National site-specific guidelines   were discussed with respect to the case.  Tumor board is a meeting of clinicians from various specialty areas who evaluate and discuss patients for whom a multidisciplinary approach is being considered. Final determinations in the plan of care are those of the provider(s). The responsibility for follow up of recommendations given during tumor board is that of the provider.   Today's extended care, comprehensive team conference, Caitlyn Marshall was not present for the discussion and was not examined.   Multidisciplinary Tumor Board is a multidisciplinary case peer review process.  Decisions discussed in the Multidisciplinary Tumor Board reflect the opinions of the specialists present at the conference without having examined the patient.  Ultimately, treatment and diagnostic decisions rest with the primary provider(s) and the patient.

## 2019-07-29 NOTE — Telephone Encounter (Signed)
Daughter is requesting RX be sent to [facility] now. They were sent to [facility] on 5/7

## 2019-07-29 NOTE — Telephone Encounter (Signed)
Patient daughter called to inform Dr Caryn Section that she will be leaving town on 07/31/19 and would like her moms Rx straightened out before she goes states that they need Prior Auth for all three the insulin regular (HUMULIN R) 100 units/mL injection,  insulin regular (NOVOLIN R,HUMULIN R) 100 units/mL injection and FreeStyle Libre 14 day need it all sent to [place] due to the lower cost. Please advise

## 2019-07-30 ENCOUNTER — Telehealth: Payer: Self-pay

## 2019-07-30 MED ORDER — FREESTYLE LIBRE 14 DAY SENSOR MISC
1.0000 | 12 refills | Status: DC
Start: 2019-07-30 — End: 2019-07-30

## 2019-07-30 MED ORDER — FREESTYLE LIBRE 14 DAY SENSOR MISC
1.0000 | 12 refills | Status: DC
Start: 2019-07-30 — End: 2019-09-04

## 2019-07-30 NOTE — Addendum Note (Signed)
Addended by: Lelon Huh E on: 07/30/2019 12:26 PM   Modules accepted: Orders

## 2019-07-30 NOTE — Telephone Encounter (Signed)
Copied from [place] [id number]. Topic: General - Other >> Jul 30, 2019  8:55 AM Antonieta Iba C wrote: Reason for CRM: pt's family member called in to request a Rx for the free style sensor that goes on the pt's arm.   She said that she was told by the pharmacy that a Rx is needed for it.    Please assist.   Pharmacy: [facility] [address] [place]  Phone:  [phone] Fax:  [phone]

## 2019-08-01 NOTE — Progress Notes (Signed)
[place]  [phone] Fax:(336) [phone]  ID: [name] OB: 09/10/1940  MR#: [id number]  BOF#:[id number]  Patient Care Team: Birdie Sons, MD as PCP - General (Family Medicine) [name], OD as Consulting Physician (Optometry) [name], [name], MD as Physician Assistant (Endocrinology) [name], MD as Consulting Physician (Internal Medicine) [name], MD as Consulting Physician (Pulmonary Disease) [name], RN as Oncology Nurse Navigator [name], [name], MD as Consulting Physician (Oncology)  CHIEF COMPLAINT: Nodule of the lower lobe left lung.  INTERVAL HISTORY: Patient returns to clinic today for further evaluation.  She has recently seen by pulmonary and it was determined that biopsy is unsafe to perform.  Patient complains of being cold, but otherwise feels well.  She continues to have chronic weakness and fatigue.  Her appetite has improved and she has gained weight in the interim.  She has no neurologic complaints.  She denies any recent fevers or illnesses.  She has no chest pain, shortness of breath, cough, or hemoptysis.  She has no nausea, vomiting, constipation, or diarrhea.  She has no urinary complaints.  Patient offers no further specific complaints today.    REVIEW OF SYSTEMS:   Review of Systems  Constitutional: Negative.  Negative for fever, malaise/fatigue and weight loss.  Respiratory: Negative.  Negative for cough, hemoptysis and shortness of breath.   Cardiovascular: Negative.  Negative for chest pain and leg swelling.  Gastrointestinal: Negative.  Negative for abdominal pain.  Genitourinary: Negative.  Negative for dysuria.  Musculoskeletal: Negative.  Negative for back pain.  Skin: Negative.  Negative for rash.  Neurological: Negative.  Negative for dizziness, focal weakness, weakness and headaches.  Psychiatric/Behavioral: Negative.  The patient is not nervous/anxious.     As per HPI.  Otherwise, a complete review of systems is negative.  PAST MEDICAL HISTORY: Past Medical History:  Diagnosis Date  . Chronic kidney disease   . Diabetes mellitus without complication ([place])   . Gout   . Hyperlipidemia   . Hypertension   . Hypothyroidism   . Thyroid disease     PAST SURGICAL HISTORY: Past Surgical History:  Procedure Laterality Date  . CATARACT EXTRACTION W/PHACO Left 05/26/2018   Procedure: CATARACT EXTRACTION PHACO AND INTRAOCULAR LENS PLACEMENT ([place])  LEFT DIABETIC;  Surgeon: Leandrew Koyanagi, MD;  Location: [facility];  Service: Ophthalmology;  Laterality: Left;  Diabetic - insulin  . TUBAL LIGATION    . Vascular Stent in right leg      FAMILY HISTORY: Family History  Problem Relation Age of Onset  . Hypertension Mother   . Diabetes Mother        Diabetes mellitus Type 2  . Alzheimer's disease Mother   . Cancer Son        died at age 97, esophagus    ADVANCED DIRECTIVES (Y/N):  N  HEALTH MAINTENANCE: Social History   Tobacco Use  . Smoking status: Former Smoker    Packs/day: 0.50    Years: 30.00    Pack years: 15.00    Types: Cigarettes    Quit date: 03/18/2007    Years since quitting: 12.3  . Smokeless tobacco: Never Used  . Tobacco comment: Quit smoking in 2008  Substance Use Topics  . Alcohol use: No    Alcohol/week: 0.0 standard drinks  . Drug use: No     Colonoscopy:  PAP:  Bone density:  Lipid panel:  No Known Allergies  Current Outpatient Medications  Medication Sig  Dispense Refill  . albuterol (VENTOLIN HFA) 108 (90 Base) MCG/ACT inhaler Inhale 2 puffs into the lungs every 6 (six) hours as needed for wheezing or shortness of breath. 18 g 5  . allopurinol (ZYLOPRIM) 100 MG tablet TAKE 2 TABLETS(200 MG) BY MOUTH TWICE DAILY 360 tablet 3  . ALPRAZolam (XANAX) 0.25 MG tablet Take 1 tablet (0.25 mg total) by mouth every 6 (six) hours as needed. 30 tablet 1  . amLODipine (NORVASC) 10 MG tablet Take 10 mg by mouth  daily.    Marland Kitchen ascorbic acid (VITAMIN C) 500 MG tablet Take 500 mg by mouth daily.    Marland Kitchen aspirin EC 81 MG tablet Take 81 mg by mouth daily.     . Cholecalciferol (VITAMIN D-3) 125 MCG (5000 UT) TABS Take 5,000 Units by mouth daily.    . Continuous Blood Gluc Receiver (FREESTYLE LIBRE READER) DEVI Use to check blood sugar four times daily for insulin dependent diabetes 1 each 0  . Continuous Blood Gluc Sensor (FREESTYLE LIBRE 14 DAY SENSOR) MISC Place 1 Device onto the skin every 14 (fourteen) days. for insulin dependent type 2 diabetes 2 each 12  . donepezil (ARICEPT) 10 MG tablet Take 1 tablet (10 mg total) by mouth at bedtime. 90 tablet 1  . doxazosin (CARDURA) 4 MG tablet Take 4 mg by mouth at bedtime.    . insulin NPH Human (HUMULIN N) 100 UNIT/ML injection Inject 6-7 Units into the skin See admin instructions. Inject 6 units subcutaneously before breakfast and 7 units before supper 10 mL 11  . insulin regular (HUMULIN R) 100 units/mL injection Inject 6 units with breakfast, 8 units with lunch and 10 units with supper 10 mL 11  . Insulin Syringe-Needle U-100 (INSULIN SYRINGE .5CC/30GX5/16") 30G X 5/16" 0.5 ML MISC U QID UTD  5  . irbesartan (AVAPRO) 300 MG tablet Take 300 mg by mouth every evening.  8  . levothyroxine (SYNTHROID, LEVOTHROID) 50 MCG tablet Take 1 tablet by mouth daily.  5  . linaclotide (LINZESS) 145 MCG CAPS capsule Take 1 capsule (145 mcg total) by mouth daily before breakfast. 90 capsule 1  . lovastatin (MEVACOR) 20 MG tablet TAKE 1 TABLET BY MOUTH DAILY 90 tablet 3  . megestrol (MEGACE) 20 MG tablet Take 1 tablet (20 mg total) by mouth daily. 30 tablet 5  . mirtazapine (REMERON) 15 MG tablet Take 1 tablet (15 mg total) by mouth at bedtime. 90 tablet 1  . montelukast (SINGULAIR) 10 MG tablet TAKE 1 TABLET BY MOUTH EVERY DAY 90 tablet 4  . ondansetron (ZOFRAN) 4 MG tablet Take 4 mg by mouth every 8 (eight) hours as needed for nausea or vomiting.     . ONE TOUCH ULTRA TEST test  strip TEST THREE TIMES DAILY 100 each 5  . ONETOUCH DELICA LANCETS 27O MISC INJECT AS DIRECTED THREE TIMES DAILY     No current facility-administered medications for this visit.    OBJECTIVE: Vitals:   08/04/19 1024  BP: 98/87  Pulse: 66  Resp: 20  Temp: 98.1 F (36.7 C)  SpO2: 100%     Body mass index is 14.7 kg/m.    ECOG FS:1 - Symptomatic but completely ambulatory  General: Thin, no acute distress.  Sitting in a wheelchair. Eyes: Pink conjunctiva, anicteric sclera. HEENT: Normocephalic, moist mucous membranes. Lungs: No audible wheezing or coughing. Heart: Regular rate and rhythm. Abdomen: Soft, nontender, no obvious distention. Musculoskeletal: No edema, cyanosis, or clubbing. Neuro: Alert, answering all questions appropriately.  Cranial nerves grossly intact. Skin: No rashes or petechiae noted. Psych: Normal affect.   LAB RESULTS:  Lab Results  Component Value Date   NA 141 07/23/2019   K 4.5 07/23/2019   CL 103 07/23/2019   CO2 24 07/23/2019   GLUCOSE 190 (H) 07/23/2019   BUN 17 07/23/2019   CREATININE 1.59 (H) 07/23/2019   CALCIUM 9.6 07/23/2019   PROT 6.4 (L) 07/10/2019   ALBUMIN 3.2 (L) 07/10/2019   AST 21 07/10/2019   ALT 12 07/10/2019   ALKPHOS 81 07/10/2019   BILITOT 0.9 07/10/2019   GFRNONAA 31 (L) 07/23/2019   GFRAA 36 (L) 07/23/2019    Lab Results  Component Value Date   WBC 8.5 07/23/2019   NEUTROABS 6.0 07/23/2019   HGB 11.4 07/23/2019   HCT 36.5 07/23/2019   MCV 82 07/23/2019   PLT 307 07/23/2019     STUDIES: MR ANGIO HEAD WO CONTRAST  Result Date: 07/08/2019 CLINICAL DATA:  Found unresponsive in bed. Right-sided facial droop and right arm weakness. Right pontine infarction question by CT. EXAM: MRI HEAD WITHOUT AND WITH CONTRAST MRA HEAD WITHOUT CONTRAST TECHNIQUE: Multiplanar, multiecho pulse sequences of the brain and surrounding structures were obtained without and with intravenous contrast. Angiographic images of the head were  obtained using MRA technique without contrast. CONTRAST:  61mL GADAVIST GADOBUTROL 1 MMOL/ML IV SOLN COMPARISON:  Head CT same day FINDINGS: MRI HEAD FINDINGS Brain: Diffusion imaging does not show any acute or subacute infarction. No brainstem insult. Cerebellum shows age related atrophy but no focal insult. Cerebral hemispheres show age related atrophy with only very minimal small vessel change of the white matter. No cortical or large vessel territory infarction. No mass lesion, hemorrhage, hydrocephalus or extra-axial collection. After contrast administration, no abnormal enhancement occurs. Vascular: Major vessels at the base of the brain show flow. Skull and upper cervical spine: Negative Sinuses/Orbits: Minimal mucosal edematous changes. Small amount of fluid in the right mastoid tip. Orbits negative. Other: None MRA HEAD FINDINGS Both internal carotid arteries widely patent through the skull base and siphon regions. The anterior and middle cerebral vessels are patent without proximal stenosis, aneurysm or vascular malformation. More distal branch vessels do show some atherosclerotic irregularity. Dominant right vertebral artery widely patent to the basilar. The left vertebral artery is a small vessel, either developmental or secondary to acquired atherosclerosis. No basilar stenosis. Posterior circulation branch vessels are patent. Distal PCA branches show some atherosclerotic irregularity. IMPRESSION: No acute or reversible brain finding. Mild age related volume loss. Mild small vessel change of the hemispheric white matter. No pontine infarction as was questioned by CT. No explanation for the mental status changes. MR angiography does not show any acute finding. Mild intracranial atherosclerotic irregularity of the distal branch vessels. Small left vertebral artery could be congenital or due to acquired atherosclerosis. Electronically Signed   By: Nelson Chimes M.D.   On: 07/08/2019 09:58   MR BRAIN W WO  CONTRAST  Result Date: 07/08/2019 CLINICAL DATA:  Found unresponsive in bed. Right-sided facial droop and right arm weakness. Right pontine infarction question by CT. EXAM: MRI HEAD WITHOUT AND WITH CONTRAST MRA HEAD WITHOUT CONTRAST TECHNIQUE: Multiplanar, multiecho pulse sequences of the brain and surrounding structures were obtained without and with intravenous contrast. Angiographic images of the head were obtained using MRA technique without contrast. CONTRAST:  60mL GADAVIST GADOBUTROL 1 MMOL/ML IV SOLN COMPARISON:  Head CT same day FINDINGS: MRI HEAD FINDINGS Brain: Diffusion imaging does not show any acute  or subacute infarction. No brainstem insult. Cerebellum shows age related atrophy but no focal insult. Cerebral hemispheres show age related atrophy with only very minimal small vessel change of the white matter. No cortical or large vessel territory infarction. No mass lesion, hemorrhage, hydrocephalus or extra-axial collection. After contrast administration, no abnormal enhancement occurs. Vascular: Major vessels at the base of the brain show flow. Skull and upper cervical spine: Negative Sinuses/Orbits: Minimal mucosal edematous changes. Small amount of fluid in the right mastoid tip. Orbits negative. Other: None MRA HEAD FINDINGS Both internal carotid arteries widely patent through the skull base and siphon regions. The anterior and middle cerebral vessels are patent without proximal stenosis, aneurysm or vascular malformation. More distal branch vessels do show some atherosclerotic irregularity. Dominant right vertebral artery widely patent to the basilar. The left vertebral artery is a small vessel, either developmental or secondary to acquired atherosclerosis. No basilar stenosis. Posterior circulation branch vessels are patent. Distal PCA branches show some atherosclerotic irregularity. IMPRESSION: No acute or reversible brain finding. Mild age related volume loss. Mild small vessel change of the  hemispheric white matter. No pontine infarction as was questioned by CT. No explanation for the mental status changes. MR angiography does not show any acute finding. Mild intracranial atherosclerotic irregularity of the distal branch vessels. Small left vertebral artery could be congenital or due to acquired atherosclerosis. Electronically Signed   By: Nelson Chimes M.D.   On: 07/08/2019 09:58   NM PET Image Initial (PI) Skull Base To Thigh  Result Date: 07/07/2019 CLINICAL DATA:  Initial treatment strategy for lung mass. EXAM: NUCLEAR MEDICINE PET SKULL BASE TO THIGH TECHNIQUE: 5.6 mCi F-18 FDG was injected intravenously. Full-ring PET imaging was performed from the skull base to thigh after the radiotracer. CT data was obtained and used for attenuation correction and anatomic localization. Fasting blood glucose: 139 mg/dl COMPARISON:  Prior chest CTs most recent with the chest abdomen and pelvis study of June 15, 2019 FINDINGS: Mediastinal blood pool activity: SUV max 1.84 Liver activity: SUV max NA NECK: No hypermetabolic lymph nodes in the neck. Incidental CT findings: none CHEST: Left juxta hilar mass in the lingula distorting the fissure and inseparable from the left cardiac border and extending from the hilum into the left chest measuring approximately 4.8 x 3.8 cm (SUVmax = 7.4) quite similar to its appearance on the diagnostic CT obtained on 06/15/2019 Associated with hypermetabolic right paratracheal, precarinal, subcarinal and left hilar adenopathyw (Image 94, series 3): Hypermetabolic subcarinal lymph node 8 mm short axis (SUVmax = 6.0) Precarinal adenopathy similar size (SUVmax = 5.0) (image 86, series 3 Mildly hypermetabolic high right paratracheal lymph node (image 75, series 3) (SUVmax = 3.2 Left-sided pleural effusion with mild hypermetabolic activity at the periphery most areas corresponding to collapsed lung with metabolic activity slightly greater than blood pool. Mildly increased metabolic  activity about the right hilum (image 102, series 3) (SUVmax = 2.8) 7 mm lymph node in this location. Background marked pulmonary emphysema with bullous changes in the right lower lobe. Along the margin of 1 of these areas of bullous disease is an area of nodularity that is unchanged in the short interval measuring approximately 2 x 1.9 cm (image 110, series 3) (SUVmax = 2.3) Incidental CT findings: calcified atheromatous plaque throughout the thoracic aorta. Mass abutting the left cardiac border. Small pericardial effusion. No chest wall lesion. No new finding since the previous CT study. Airways are patent. Left pleural effusion with similar volume to slightly diminished  volume. ABDOMEN/PELVIS: Asymmetric renal activity with upper pole activity that extend slightly beyond the contour of the left kidney but without CT correlate. A similar appearance in the upper pole the right kidney where there is cortical scarring. No hypermetabolic areas in other solid organs. Changes of chronic calcific pancreatitis. No abdominal adenopathy. Crowding of bowel loops in the abdomen due to cachexia and limited intra-abdominal fat limiting assessment. Incidental CT findings: Low-density lesions in the liver likely small cysts. Limited visualization of the adrenal glands which are mildly nodular but not associated with increased FDG uptake beyond liver or blood pool. Numerous bilateral intrarenal calculi and likely parenchymal calculi in the kidneys. No hydronephrosis. No visible renal lesion. Limited assessment of bowel loops due to lack of mesenteric and intra-abdominal fat with similar appearance of visualized bowel accounting for lack of contrast on today's study is compared to the study of 06/15/2019. SKELETON: No focal hypermetabolic activity to suggest skeletal metastasis. Incidental CT findings: Spinal degenerative changes. IMPRESSION: 1. Hypermetabolic mass in the left chest with contralateral mediastinal adenopathy showing  hypermetabolic features, suspicious for primary bronchogenic neoplasm. 2. Lesion distorts the fissure and is inseparable from the left heart border. 3. Left effusion without discrete marked hypermetabolic features. Collapsed lung along the anterior margin displaying slightly greater than blood pool activity. 4. Mild increased metabolism in right hilar lymph nodes and a focus of nodularity along bullous changes in the right lower lobe. Finding may represent a second primary or metastatic disease, increased in size since the study of May 01, 2017. Currently approximately 2 x 1.9 cm, area of concern along the anterolateral margin of this cystic and or bullous area previously measuring approximately 15 x 11 mm in 2019. 5. Areas of activity not registering well with the renal profile on CT images likely associated with pooling of FDG within excreted urine in areas of patulous collecting systems or in dependent collecting systems. Dedicated renal imaging may be helpful for further assessment, ultrasound could be considered given small size of the patient as renal function may not allow for administration of intravenous contrast. 6. Changes of chronic calcific pancreatitis. Electronically Signed   By: Zetta Bills M.D.   On: 07/07/2019 16:12   DG Chest Port 1 View  Result Date: 07/08/2019 CLINICAL DATA:  Cough. Known lung mass. EXAM: PORTABLE CHEST 1 VIEW COMPARISON:  PET CT yesterday. Radiograph 06/25/2019. Chest CT 06/15/2019 FINDINGS: Left pleuroparenchymal opacity corresponds to lung mass and pleural effusion on PET CT yesterday. Emphysema. [place] in the right lower lung zone with adjacent nodular consolidation again seen. Left hilar calcified nodes. No pneumothorax. Heart is normal in size. Pancreatic calcifications in the upper abdomen. IMPRESSION: 1. Left pleuroparenchymal opacity corresponds to lung mass and pleural effusion, characterized on PET CT yesterday. 2. Emphysema.  No acute findings by  radiograph. Electronically Signed   By: Keith Rake M.D.   On: 07/08/2019 03:16   EEG adult  Result Date: 07/09/2019 Lora Havens, MD     07/09/2019  8:31 AM Patient Name: Caitlyn Marshall MRN: [id number] Epilepsy Attending: Lora Havens Referring Physician/Provider: Dr. Amie [place] Date: 07/08/2019 Duration: 24.36 minutes Patient history: 79 year old female who presented with new onset right facial droop, right-sided weakness and altered mental status.  MRI did not show any acute stroke.  EEG evaluate for seizures. Level of alertness: Awake, asleep AEDs during EEG study: None Technical aspects: This EEG study was done with scalp electrodes positioned according to the 10-20 International system of electrode placement. Dealer  activity was acquired at a sampling rate of 500Hz  and reviewed with a high frequency filter of 70Hz  and a low frequency filter of 1Hz . EEG data were recorded continuously and digitally stored. Description: No clear posterior dominant rhythm was seen.  Sleep was characterized by vertex waves, maximal frontocentral region.  EEG showed initial was polymorphic 3 to 5 Hz theta-delta slowing. Hyperventilation and photic stimulation were not performed. Abnormality - Continuous slow, generalized IMPRESSION: This study is suggestive of moderate diffuse encephalopathy, nonspecific to etiology.  No seizures or epileptiform discharges were seen throughout the recording. Lora Havens   CT HEAD CODE STROKE WO CONTRAST  Result Date: 07/08/2019 CLINICAL DATA:  Code stroke. Initial evaluation for acute altered mental status, right-sided weakness. EXAM: CT HEAD WITHOUT CONTRAST TECHNIQUE: Contiguous axial images were obtained from the base of the skull through the vertex without intravenous contrast. COMPARISON:  None. FINDINGS: Brain: Generalized age-related cerebral atrophy with moderate chronic microischemic disease. No acute intracranial hemorrhage. There is question of a focal  hypodensity involving the right paramedian pons, difficult to be certain as this area is prone artifact on CT. No other acute large vessel territory infarct. No mass lesion, midline shift or mass effect. No hydrocephalus or extra-axial fluid collection. Vascular: No hyperdense vessel. Calcified atherosclerosis present at skull base. Skull: Scalp soft tissues and calvarium within normal limits. Sinuses/Orbits: Globes orbital soft tissues demonstrate no acute finding. Paranasal sinuses are largely clear. Small chronic appearing right mastoid effusion noted. Left mastoid air cells are clear. Other: None. ASPECTS [facility] Stroke Program Early CT Score) - Ganglionic level infarction (caudate, lentiform nuclei, internal capsule, insula, M1-M3 cortex): 7 - Supraganglionic infarction (M4-M6 cortex): 3 Total score (0-10 with 10 being normal): 10 IMPRESSION: 1. Question a focal hypodensity involving the right paramedian pons, which could reflect an evolving acute ischemic infarct. Difficult to be certain of this finding as this area is prone to artifact on CT. No intracranial hemorrhage. 2. ASPECTS is 10. 3. Age-related cerebral atrophy with chronic microvascular ischemic disease. These results were communicated to Dr. Cheral Marker at 2:25 [place] 4/22/2021by text page via the [facility] messaging system. Electronically Signed   By: Jeannine Boga M.D.   On: 07/08/2019 02:30    ASSESSMENT:  Nodule of the lower lobe left lung.  PLAN:    1. Nodule of the lower lobe left lung: CT scan results from June 15, 2019 reviewed independently and report as above with persistent suspicion of malignancy in lingula of left lower lobe.  Patient also has possible enlarged precarinal and subcarinal lymph nodes.  MRI of her brain on July 08, 2019 did not reveal metastatic disease.  PET scan on July 07, 2019 reviewed independently and report as above with significant hypermetabolism of left lower lobe lesion as well as contralateral  mediastinal lymphadenopathy possibly consistent with a second primary.  Patient was evaluated by pulmonary and it was determined that it would be unsafe to proceed with biopsy.  Although chemotherapy is not an option without biopsy, patient indicated that she likely would not agree to treatment anyway.  She has agreed for consultation with radiation oncology to assess whether palliative XRT is a possibility.  Patient also has consultation with palliative care today.  No further intervention is needed.  Return to clinic in 3 months for routine evaluation at which point we will decide if additional imaging is necessary.  2.  Weight loss: Improving. 3.  Possible TIA: Patient has no obvious residual neurologic deficits.  I spent a  total of 30 minutes reviewing chart data, face-to-face evaluation with the patient, counseling and coordination of care as detailed above.   Patient expressed understanding and was in agreement with this plan. She also understands that She can call clinic at any time with any questions, concerns, or complaints.   Cancer Staging No matching staging information was found for the patient.  Lloyd Huger, MD   08/04/2019 10:51 AM

## 2019-08-02 DIAGNOSIS — K5900 Constipation, unspecified: Secondary | ICD-10-CM | POA: Diagnosis not present

## 2019-08-02 DIAGNOSIS — M1030 Gout due to renal impairment, unspecified site: Secondary | ICD-10-CM | POA: Diagnosis not present

## 2019-08-02 DIAGNOSIS — E1151 Type 2 diabetes mellitus with diabetic peripheral angiopathy without gangrene: Secondary | ICD-10-CM | POA: Diagnosis not present

## 2019-08-02 DIAGNOSIS — E039 Hypothyroidism, unspecified: Secondary | ICD-10-CM | POA: Diagnosis not present

## 2019-08-02 DIAGNOSIS — M1990 Unspecified osteoarthritis, unspecified site: Secondary | ICD-10-CM | POA: Diagnosis not present

## 2019-08-02 DIAGNOSIS — J309 Allergic rhinitis, unspecified: Secondary | ICD-10-CM | POA: Diagnosis not present

## 2019-08-02 DIAGNOSIS — N184 Chronic kidney disease, stage 4 (severe): Secondary | ICD-10-CM | POA: Diagnosis not present

## 2019-08-02 DIAGNOSIS — M719 Bursopathy, unspecified: Secondary | ICD-10-CM | POA: Diagnosis not present

## 2019-08-02 DIAGNOSIS — E1122 Type 2 diabetes mellitus with diabetic chronic kidney disease: Secondary | ICD-10-CM | POA: Diagnosis not present

## 2019-08-02 DIAGNOSIS — D631 Anemia in chronic kidney disease: Secondary | ICD-10-CM | POA: Diagnosis not present

## 2019-08-02 DIAGNOSIS — N390 Urinary tract infection, site not specified: Secondary | ICD-10-CM | POA: Diagnosis not present

## 2019-08-02 DIAGNOSIS — I129 Hypertensive chronic kidney disease with stage 1 through stage 4 chronic kidney disease, or unspecified chronic kidney disease: Secondary | ICD-10-CM | POA: Diagnosis not present

## 2019-08-04 ENCOUNTER — Telehealth: Payer: Self-pay

## 2019-08-04 ENCOUNTER — Encounter: Payer: Self-pay | Admitting: Oncology

## 2019-08-04 ENCOUNTER — Other Ambulatory Visit: Payer: Self-pay

## 2019-08-04 ENCOUNTER — Inpatient Hospital Stay (HOSPITAL_BASED_OUTPATIENT_CLINIC_OR_DEPARTMENT_OTHER): Payer: Medicare Other | Admitting: Hospice and Palliative Medicine

## 2019-08-04 ENCOUNTER — Inpatient Hospital Stay: Payer: Medicare Other | Admitting: Oncology

## 2019-08-04 VITALS — BP 98/87 | HR 66 | Temp 98.1°F | Resp 20 | Wt 91.1 lb

## 2019-08-04 DIAGNOSIS — N184 Chronic kidney disease, stage 4 (severe): Secondary | ICD-10-CM | POA: Diagnosis not present

## 2019-08-04 DIAGNOSIS — J439 Emphysema, unspecified: Secondary | ICD-10-CM | POA: Diagnosis not present

## 2019-08-04 DIAGNOSIS — R911 Solitary pulmonary nodule: Secondary | ICD-10-CM

## 2019-08-04 DIAGNOSIS — I672 Cerebral atherosclerosis: Secondary | ICD-10-CM | POA: Diagnosis not present

## 2019-08-04 DIAGNOSIS — I6782 Cerebral ischemia: Secondary | ICD-10-CM | POA: Diagnosis not present

## 2019-08-04 DIAGNOSIS — Z794 Long term (current) use of insulin: Secondary | ICD-10-CM | POA: Diagnosis not present

## 2019-08-04 DIAGNOSIS — R64 Cachexia: Secondary | ICD-10-CM | POA: Diagnosis not present

## 2019-08-04 DIAGNOSIS — Z515 Encounter for palliative care: Secondary | ICD-10-CM | POA: Diagnosis not present

## 2019-08-04 DIAGNOSIS — R609 Edema, unspecified: Secondary | ICD-10-CM | POA: Diagnosis not present

## 2019-08-04 DIAGNOSIS — K861 Other chronic pancreatitis: Secondary | ICD-10-CM | POA: Diagnosis not present

## 2019-08-04 DIAGNOSIS — I313 Pericardial effusion (noninflammatory): Secondary | ICD-10-CM | POA: Diagnosis not present

## 2019-08-04 DIAGNOSIS — Z79899 Other long term (current) drug therapy: Secondary | ICD-10-CM | POA: Diagnosis not present

## 2019-08-04 DIAGNOSIS — R569 Unspecified convulsions: Secondary | ICD-10-CM | POA: Diagnosis not present

## 2019-08-04 DIAGNOSIS — J90 Pleural effusion, not elsewhere classified: Secondary | ICD-10-CM | POA: Diagnosis not present

## 2019-08-04 DIAGNOSIS — E1122 Type 2 diabetes mellitus with diabetic chronic kidney disease: Secondary | ICD-10-CM | POA: Diagnosis not present

## 2019-08-04 DIAGNOSIS — R29810 Facial weakness: Secondary | ICD-10-CM | POA: Diagnosis not present

## 2019-08-04 DIAGNOSIS — Z87891 Personal history of nicotine dependence: Secondary | ICD-10-CM | POA: Diagnosis not present

## 2019-08-04 DIAGNOSIS — E039 Hypothyroidism, unspecified: Secondary | ICD-10-CM | POA: Diagnosis not present

## 2019-08-04 DIAGNOSIS — R5383 Other fatigue: Secondary | ICD-10-CM | POA: Diagnosis not present

## 2019-08-04 DIAGNOSIS — R531 Weakness: Secondary | ICD-10-CM | POA: Diagnosis not present

## 2019-08-04 DIAGNOSIS — R590 Localized enlarged lymph nodes: Secondary | ICD-10-CM | POA: Diagnosis not present

## 2019-08-04 DIAGNOSIS — N200 Calculus of kidney: Secondary | ICD-10-CM | POA: Diagnosis not present

## 2019-08-04 DIAGNOSIS — I129 Hypertensive chronic kidney disease with stage 1 through stage 4 chronic kidney disease, or unspecified chronic kidney disease: Secondary | ICD-10-CM | POA: Diagnosis not present

## 2019-08-04 NOTE — Progress Notes (Signed)
Patient here today for follow up. Patient denies any pain or concerns today. States her appetite is getting some better and she is has gained some weight since last visit.

## 2019-08-04 NOTE — Progress Notes (Signed)
[place]  [phone] [phone]   Name: Caitlyn Marshall Date: 08/04/2019 MRN: [id number]  DOB: 08-07-1940  Patient Care Team: Birdie Sons, MD as PCP - General (Family Medicine) [name], OD as Consulting Physician (Optometry) [name], [name], MD as Physician Assistant (Endocrinology) [name], MD as Consulting Physician (Internal Medicine) [name], MD as Consulting Physician (Pulmonary Disease) [name], RN as Oncology Nurse Navigator [name], [name], MD as Consulting Physician (Oncology)    REASON FOR CONSULTATION: CHASLYN Marshall is a 79 y.o. 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  Patient was hospitalized 07/06/2019 to 07/14/2019 with altered mental status thought secondary to a TIA but etiology was unclear.  Patient is being followed by medical oncology but is thought not to be a candidate for systemic treatment.  She was referred for RT.  Palliative care was consulted to help address goals and manage ongoing symptoms.  SOCIAL HISTORY:     reports that she quit smoking about 12 years ago. Her smoking use included cigarettes. She has a 15.00 pack-year smoking history. She has never used smokeless tobacco. She reports that she does not drink alcohol or use drugs.   Patient is unmarried.  She lives at home with her son and grandson.  She has two daughters who live nearby.  Patient had another son who is now deceased.  ADVANCE DIRECTIVES:  Does not have  CODE STATUS:   PAST MEDICAL HISTORY: Past Medical History:  Diagnosis Date  . Chronic kidney disease   . Diabetes mellitus without complication ([place])   . Gout   . Hyperlipidemia   . Hypertension   . Hypothyroidism   . Thyroid disease     PAST SURGICAL HISTORY:  Past Surgical History:    Procedure Laterality Date  . CATARACT EXTRACTION W/PHACO Left 05/26/2018   Procedure: CATARACT EXTRACTION PHACO AND INTRAOCULAR LENS PLACEMENT ([place])  LEFT DIABETIC;  Surgeon: Leandrew Koyanagi, MD;  Location: [place];  Service: Ophthalmology;  Laterality: Left;  Diabetic - insulin  . TUBAL LIGATION    . Vascular Stent in right leg      HEMATOLOGY/ONCOLOGY HISTORY:  Oncology History   No history exists.    ALLERGIES:  has No Known Allergies.  MEDICATIONS:  Current Outpatient Medications  Medication Sig Dispense Refill  . albuterol (VENTOLIN HFA) 108 (90 Base) MCG/ACT inhaler Inhale 2 puffs into the lungs every 6 (six) hours as needed for wheezing or shortness of breath. 18 g 5  . allopurinol (ZYLOPRIM) 100 MG tablet TAKE 2 TABLETS(200 MG) BY MOUTH TWICE DAILY 360 tablet 3  . ALPRAZolam (XANAX) 0.25 MG tablet Take 1 tablet (0.25 mg total) by mouth every 6 (six) hours as needed. 30 tablet 1  . amLODipine (NORVASC) 10 MG tablet Take 10 mg by mouth daily.    Marland Kitchen ascorbic acid (VITAMIN C) 500 MG tablet Take 500 mg by mouth daily.    Marland Kitchen aspirin EC 81 MG tablet Take 81 mg by mouth daily.     . Cholecalciferol (VITAMIN D-3) 125 MCG (5000 UT) TABS Take 5,000 Units by mouth daily.    . Continuous Blood Gluc Receiver (FREESTYLE LIBRE READER) DEVI Use to check blood sugar four times daily for insulin dependent diabetes 1 each 0  . Continuous Blood Gluc Sensor (FREESTYLE LIBRE 14 DAY SENSOR) MISC Place 1 Device onto  the skin every 14 (fourteen) days. for insulin dependent type 2 diabetes 2 each 12  . donepezil (ARICEPT) 10 MG tablet Take 1 tablet (10 mg total) by mouth at bedtime. 90 tablet 1  . doxazosin (CARDURA) 4 MG tablet Take 4 mg by mouth at bedtime.    . insulin NPH Human (HUMULIN N) 100 UNIT/ML injection Inject 6-7 Units into the skin See admin instructions. Inject 6 units subcutaneously before breakfast and 7 units before supper 10 mL 11  . insulin regular (HUMULIN R) 100  units/mL injection Inject 6 units with breakfast, 8 units with lunch and 10 units with supper 10 mL 11  . Insulin Syringe-Needle U-100 (INSULIN SYRINGE .5CC/30GX5/16") 30G X 5/16" 0.5 ML MISC U QID UTD  5  . irbesartan (AVAPRO) 300 MG tablet Take 300 mg by mouth every evening.  8  . levothyroxine (SYNTHROID, LEVOTHROID) 50 MCG tablet Take 1 tablet by mouth daily.  5  . linaclotide (LINZESS) 145 MCG CAPS capsule Take 1 capsule (145 mcg total) by mouth daily before breakfast. 90 capsule 1  . lovastatin (MEVACOR) 20 MG tablet TAKE 1 TABLET BY MOUTH DAILY 90 tablet 3  . megestrol (MEGACE) 20 MG tablet Take 1 tablet (20 mg total) by mouth daily. 30 tablet 5  . mirtazapine (REMERON) 15 MG tablet Take 1 tablet (15 mg total) by mouth at bedtime. 90 tablet 1  . montelukast (SINGULAIR) 10 MG tablet TAKE 1 TABLET BY MOUTH EVERY DAY 90 tablet 4  . ondansetron (ZOFRAN) 4 MG tablet Take 4 mg by mouth every 8 (eight) hours as needed for nausea or vomiting.     . ONE TOUCH ULTRA TEST test strip TEST THREE TIMES DAILY 100 each 5  . ONETOUCH DELICA LANCETS 31D MISC INJECT AS DIRECTED THREE TIMES DAILY     No current facility-administered medications for this visit.    VITAL SIGNS: There were no vitals taken for this visit. There were no vitals filed for this visit.  Estimated body mass index is 14.7 kg/m as calculated from the following:   Height as of 07/22/19: '5\' 6"'$  (1.676 m).   Weight as of an earlier encounter on 08/04/19: 91 lb 1.6 oz (41.3 kg).  LABS: CBC:    Component Value Date/Time   WBC 8.5 07/23/2019 1409   WBC 8.1 07/10/2019 1248   HGB 11.4 07/23/2019 1409   HCT 36.5 07/23/2019 1409   PLT 307 07/23/2019 1409   MCV 82 07/23/2019 1409   MCV 90 05/04/2013 1411   NEUTROABS 6.0 07/23/2019 1409   NEUTROABS 7.6 (H) 04/28/2012 1301   LYMPHSABS 1.4 07/23/2019 1409   LYMPHSABS 1.6 04/28/2012 1301   MONOABS 1.3 (H) 07/08/2019 0200   MONOABS 0.8 04/28/2012 1301   EOSABS 0.3 07/23/2019 1409    EOSABS 0.2 04/28/2012 1301   BASOSABS 0.1 07/23/2019 1409   BASOSABS 2 06/29/2012 0431   BASOSABS 0.1 04/28/2012 1301   Comprehensive Metabolic Panel:    Component Value Date/Time   NA 141 07/23/2019 1409   NA 142 05/07/2013 0450   K 4.5 07/23/2019 1409   K 4.8 05/07/2013 0450   CL 103 07/23/2019 1409   CL 116 (H) 05/07/2013 0450   CO2 24 07/23/2019 1409   CO2 21 05/07/2013 0450   BUN 17 07/23/2019 1409   BUN 18 05/07/2013 0450   CREATININE 1.59 (H) 07/23/2019 1409   CREATININE 1.76 (H) 05/07/2013 0450   GLUCOSE 190 (H) 07/23/2019 1409   GLUCOSE 116 (H) 07/13/2019 1761  GLUCOSE 195 (H) 05/07/2013 0450   CALCIUM 9.6 07/23/2019 1409   CALCIUM 8.7 05/07/2013 0450   AST 21 07/10/2019 1248   AST 23 05/04/2013 1411   ALT 12 07/10/2019 1248   ALT 13 05/04/2013 1411   ALKPHOS 81 07/10/2019 1248   ALKPHOS 192 (H) 05/04/2013 1411   BILITOT 0.9 07/10/2019 1248   BILITOT 0.4 05/31/2019 1059   BILITOT 0.6 05/04/2013 1411   PROT 6.4 (L) 07/10/2019 1248   PROT 6.6 05/31/2019 1059   PROT 7.4 05/04/2013 1411   ALBUMIN 3.2 (L) 07/10/2019 1248   ALBUMIN 4.1 05/31/2019 1059   ALBUMIN 3.5 05/04/2013 1411    RADIOGRAPHIC STUDIES: MR ANGIO HEAD WO CONTRAST  Result Date: 07/08/2019 CLINICAL DATA:  Found unresponsive in bed. Right-sided facial droop and right arm weakness. Right pontine infarction question by CT. EXAM: MRI HEAD WITHOUT AND WITH CONTRAST MRA HEAD WITHOUT CONTRAST TECHNIQUE: Multiplanar, multiecho pulse sequences of the brain and surrounding structures were obtained without and with intravenous contrast. Angiographic images of the head were obtained using MRA technique without contrast. CONTRAST:  22m GADAVIST GADOBUTROL 1 MMOL/ML IV SOLN COMPARISON:  Head CT same day FINDINGS: MRI HEAD FINDINGS Brain: Diffusion imaging does not show any acute or subacute infarction. No brainstem insult. Cerebellum shows age related atrophy but no focal insult. Cerebral hemispheres show age  related atrophy with only very minimal small vessel change of the white matter. No cortical or large vessel territory infarction. No mass lesion, hemorrhage, hydrocephalus or extra-axial collection. After contrast administration, no abnormal enhancement occurs. Vascular: Major vessels at the base of the brain show flow. Skull and upper cervical spine: Negative Sinuses/Orbits: Minimal mucosal edematous changes. Small amount of fluid in the right mastoid tip. Orbits negative. Other: None MRA HEAD FINDINGS Both internal carotid arteries widely patent through the skull base and siphon regions. The anterior and middle cerebral vessels are patent without proximal stenosis, aneurysm or vascular malformation. More distal branch vessels do show some atherosclerotic irregularity. Dominant right vertebral artery widely patent to the basilar. The left vertebral artery is a small vessel, either developmental or secondary to acquired atherosclerosis. No basilar stenosis. Posterior circulation branch vessels are patent. Distal PCA branches show some atherosclerotic irregularity. IMPRESSION: No acute or reversible brain finding. Mild age related volume loss. Mild small vessel change of the hemispheric white matter. No pontine infarction as was questioned by CT. No explanation for the mental status changes. MR angiography does not show any acute finding. Mild intracranial atherosclerotic irregularity of the distal branch vessels. Small left vertebral artery could be congenital or due to acquired atherosclerosis. Electronically Signed   By: MNelson ChimesM.D.   On: 07/08/2019 09:58   MR BRAIN W WO CONTRAST  Result Date: 07/08/2019 CLINICAL DATA:  Found unresponsive in bed. Right-sided facial droop and right arm weakness. Right pontine infarction question by CT. EXAM: MRI HEAD WITHOUT AND WITH CONTRAST MRA HEAD WITHOUT CONTRAST TECHNIQUE: Multiplanar, multiecho pulse sequences of the brain and surrounding structures were obtained  without and with intravenous contrast. Angiographic images of the head were obtained using MRA technique without contrast. CONTRAST:  558mGADAVIST GADOBUTROL 1 MMOL/ML IV SOLN COMPARISON:  Head CT same day FINDINGS: MRI HEAD FINDINGS Brain: Diffusion imaging does not show any acute or subacute infarction. No brainstem insult. Cerebellum shows age related atrophy but no focal insult. Cerebral hemispheres show age related atrophy with only very minimal small vessel change of the white matter. No cortical or large vessel territory infarction.  No mass lesion, hemorrhage, hydrocephalus or extra-axial collection. After contrast administration, no abnormal enhancement occurs. Vascular: Major vessels at the base of the brain show flow. Skull and upper cervical spine: Negative Sinuses/Orbits: Minimal mucosal edematous changes. Small amount of fluid in the right mastoid tip. Orbits negative. Other: None MRA HEAD FINDINGS Both internal carotid arteries widely patent through the skull base and siphon regions. The anterior and middle cerebral vessels are patent without proximal stenosis, aneurysm or vascular malformation. More distal branch vessels do show some atherosclerotic irregularity. Dominant right vertebral artery widely patent to the basilar. The left vertebral artery is a small vessel, either developmental or secondary to acquired atherosclerosis. No basilar stenosis. Posterior circulation branch vessels are patent. Distal PCA branches show some atherosclerotic irregularity. IMPRESSION: No acute or reversible brain finding. Mild age related volume loss. Mild small vessel change of the hemispheric white matter. No pontine infarction as was questioned by CT. No explanation for the mental status changes. MR angiography does not show any acute finding. Mild intracranial atherosclerotic irregularity of the distal branch vessels. Small left vertebral artery could be congenital or due to acquired atherosclerosis.  Electronically Signed   By: Nelson Chimes M.D.   On: 07/08/2019 09:58   NM PET Image Initial (PI) Skull Base To Thigh  Result Date: 07/07/2019 CLINICAL DATA:  Initial treatment strategy for lung mass. EXAM: NUCLEAR MEDICINE PET SKULL BASE TO THIGH TECHNIQUE: 5.6 mCi F-18 FDG was injected intravenously. Full-ring PET imaging was performed from the skull base to thigh after the radiotracer. CT data was obtained and used for attenuation correction and anatomic localization. Fasting blood glucose: 139 mg/dl COMPARISON:  Prior chest CTs most recent with the chest abdomen and pelvis study of June 15, 2019 FINDINGS: Mediastinal blood pool activity: SUV max 1.84 Liver activity: SUV max NA NECK: No hypermetabolic lymph nodes in the neck. Incidental CT findings: none CHEST: Left juxta hilar mass in the lingula distorting the fissure and inseparable from the left cardiac border and extending from the hilum into the left chest measuring approximately 4.8 x 3.8 cm (SUVmax = 7.4) quite similar to its appearance on the diagnostic CT obtained on 06/15/2019 Associated with hypermetabolic right paratracheal, precarinal, subcarinal and left hilar adenopathyw (Image 94, series 3): Hypermetabolic subcarinal lymph node 8 mm short axis (SUVmax = 6.0) Precarinal adenopathy similar size (SUVmax = 5.0) (image 86, series 3 Mildly hypermetabolic high right paratracheal lymph node (image 75, series 3) (SUVmax = 3.2 Left-sided pleural effusion with mild hypermetabolic activity at the periphery most areas corresponding to collapsed lung with metabolic activity slightly greater than blood pool. Mildly increased metabolic activity about the right hilum (image 102, series 3) (SUVmax = 2.8) 7 mm lymph node in this location. Background marked pulmonary emphysema with bullous changes in the right lower lobe. Along the margin of 1 of these areas of bullous disease is an area of nodularity that is unchanged in the short interval measuring  approximately 2 x 1.9 cm (image 110, series 3) (SUVmax = 2.3) Incidental CT findings: calcified atheromatous plaque throughout the thoracic aorta. Mass abutting the left cardiac border. Small pericardial effusion. No chest wall lesion. No new finding since the previous CT study. Airways are patent. Left pleural effusion with similar volume to slightly diminished volume. ABDOMEN/PELVIS: Asymmetric renal activity with upper pole activity that extend slightly beyond the contour of the left kidney but without CT correlate. A similar appearance in the upper pole the right kidney where there is cortical scarring. No  hypermetabolic areas in other solid organs. Changes of chronic calcific pancreatitis. No abdominal adenopathy. Crowding of bowel loops in the abdomen due to cachexia and limited intra-abdominal fat limiting assessment. Incidental CT findings: Low-density lesions in the liver likely small cysts. Limited visualization of the adrenal glands which are mildly nodular but not associated with increased FDG uptake beyond liver or blood pool. Numerous bilateral intrarenal calculi and likely parenchymal calculi in the kidneys. No hydronephrosis. No visible renal lesion. Limited assessment of bowel loops due to lack of mesenteric and intra-abdominal fat with similar appearance of visualized bowel accounting for lack of contrast on today's study is compared to the study of 06/15/2019. SKELETON: No focal hypermetabolic activity to suggest skeletal metastasis. Incidental CT findings: Spinal degenerative changes. IMPRESSION: 1. Hypermetabolic mass in the left chest with contralateral mediastinal adenopathy showing hypermetabolic features, suspicious for primary bronchogenic neoplasm. 2. Lesion distorts the fissure and is inseparable from the left heart border. 3. Left effusion without discrete marked hypermetabolic features. Collapsed lung along the anterior margin displaying slightly greater than blood pool activity. 4.  Mild increased metabolism in right hilar lymph nodes and a focus of nodularity along bullous changes in the right lower lobe. Finding may represent a second primary or metastatic disease, increased in size since the study of May 01, 2017. Currently approximately 2 x 1.9 cm, area of concern along the anterolateral margin of this cystic and or bullous area previously measuring approximately 15 x 11 mm in 2019. 5. Areas of activity not registering well with the renal profile on CT images likely associated with pooling of FDG within excreted urine in areas of patulous collecting systems or in dependent collecting systems. Dedicated renal imaging may be helpful for further assessment, ultrasound could be considered given small size of the patient as renal function may not allow for administration of intravenous contrast. 6. Changes of chronic calcific pancreatitis. Electronically Signed   By: Zetta Bills M.D.   On: 07/07/2019 16:12   DG Chest Port 1 View  Result Date: 07/08/2019 CLINICAL DATA:  Cough. Known lung mass. EXAM: PORTABLE CHEST 1 VIEW COMPARISON:  PET CT yesterday. Radiograph 06/25/2019. Chest CT 06/15/2019 FINDINGS: Left pleuroparenchymal opacity corresponds to lung mass and pleural effusion on PET CT yesterday. Emphysema. Bulla in the right lower lung zone with adjacent nodular consolidation again seen. Left hilar calcified nodes. No pneumothorax. Heart is normal in size. Pancreatic calcifications in the upper abdomen. IMPRESSION: 1. Left pleuroparenchymal opacity corresponds to lung mass and pleural effusion, characterized on PET CT yesterday. 2. Emphysema.  No acute findings by radiograph. Electronically Signed   By: Keith Rake M.D.   On: 07/08/2019 03:16   EEG adult  Result Date: 07/09/2019 Lora Havens, MD     07/09/2019  8:31 AM Patient Name: TEMARA LANUM MRN: [id number] Epilepsy Attending: Lora Havens Referring Physician/Provider: Dr. Amie Portland Date: 07/08/2019  Duration: 24.36 minutes Patient history: 79 year old female who presented with new onset right facial droop, right-sided weakness and altered mental status.  MRI did not show any acute stroke.  EEG evaluate for seizures. Level of alertness: Awake, asleep AEDs during EEG study: None Technical aspects: This EEG study was done with scalp electrodes positioned according to the 10-20 International system of electrode placement. Electrical activity was acquired at a sampling rate of _0  and reviewed with a high frequency filter of _1  and a low frequency filter of _2 . EEG data were recorded continuously and digitally stored. Description: No clear posterior dominant rhythm  was seen.  Sleep was characterized by vertex waves, maximal frontocentral region.  EEG showed initial was polymorphic 3 to 5 Hz theta-delta slowing. Hyperventilation and photic stimulation were not performed. Abnormality - Continuous slow, generalized IMPRESSION: This study is suggestive of moderate diffuse encephalopathy, nonspecific to etiology.  No seizures or epileptiform discharges were seen throughout the recording. Lora Havens   CT HEAD CODE STROKE WO CONTRAST  Result Date: 07/08/2019 CLINICAL DATA:  Code stroke. Initial evaluation for acute altered mental status, right-sided weakness. EXAM: CT HEAD WITHOUT CONTRAST TECHNIQUE: Contiguous axial images were obtained from the base of the skull through the vertex without intravenous contrast. COMPARISON:  None. FINDINGS: Brain: Generalized age-related cerebral atrophy with moderate chronic microischemic disease. No acute intracranial hemorrhage. There is question of a focal hypodensity involving the right paramedian pons, difficult to be certain as this area is prone artifact on CT. No other acute large vessel territory infarct. No mass lesion, midline shift or mass effect. No hydrocephalus or extra-axial fluid collection. Vascular: No hyperdense vessel. Calcified atherosclerosis present  at skull base. Skull: Scalp soft tissues and calvarium within normal limits. Sinuses/Orbits: Globes orbital soft tissues demonstrate no acute finding. Paranasal sinuses are largely clear. Small chronic appearing right mastoid effusion noted. Left mastoid air cells are clear. Other: None. ASPECTS [facility] Stroke Program Early CT Score) - Ganglionic level infarction (caudate, lentiform nuclei, internal capsule, insula, M1-M3 cortex): 7 - Supraganglionic infarction (M4-M6 cortex): 3 Total score (0-10 with 10 being normal): 10 IMPRESSION: 1. Question a focal hypodensity involving the right paramedian pons, which could reflect an evolving acute ischemic infarct. Difficult to be certain of this finding as this area is prone to artifact on CT. No intracranial hemorrhage. 2. ASPECTS is 10. 3. Age-related cerebral atrophy with chronic microvascular ischemic disease. These results were communicated to Dr. Cheral Marker at 2:25 Robinson 4/22/2021by text page via the [facility] messaging system. Electronically Signed   By: Jeannine Boga M.D.   On: 07/08/2019 02:30    PERFORMANCE STATUS (ECOG) : 2 - Symptomatic, <50% confined to bed  Review of Systems Unless otherwise noted, a complete review of systems is negative.  Physical Exam General: NAD, frail appearing Pulmonary: Unlabored Extremities: no edema, no joint deformities Skin: no rashes Neurological: Weakness but otherwise nonfocal  IMPRESSION: I met with patient and her daughter today in the clinic.  I introduced palliative care services and attempted to establish therapeutic rapport.  Patient asked that we have a brief visit as she was cold and ready to leave the clinic.  Patient reports that she is doing better at home over the past several weeks.  She has an improved appetite and functional status.  She says she is mostly able to care for herself at home but sometimes has assistance from family.  She denies any distressing symptoms today.  Both patient and  daughter verbalized an understanding of her cancer treatment plan.  She is felt not to be a candidate for biopsy or systemic chemotherapy.  She is being referred for palliative RT with plan for medical oncology follow-up in 3 months.  Patient does not have advanced directives.  I reviewed with her ACP documents and a MOST form today, which she took home to discuss with family.  We discussed the probable futility associated with resuscitation or aggressive measures at end-of-life in the setting of an incurable cancer.  Patient/daughter are in agreement with community-based palliative care.  Will refer.  PLAN: -Best supportive care -ACP/MOST form discussed -Refer to  community-based palliative care -RTC in 3 months   Patient expressed understanding and was in agreement with this plan. She also understands that She can call the clinic at any time with any questions, concerns, or complaints.     Time Total: 15 minutes  Visit consisted of counseling and education dealing with the complex and emotionally intense issues of symptom management and palliative care in the setting of serious and potentially life-threatening illness.Greater than 50%  of this time was spent counseling and coordinating care related to the above assessment and plan.  Signed by: Altha Harm, PhD, NP-C

## 2019-08-04 NOTE — Telephone Encounter (Signed)
Can you find another [facility] agency. [name] refused this one.

## 2019-08-04 NOTE — Telephone Encounter (Signed)
Copied from [place] [id number]. Topic: General - Other >> Aug 03, 2019  4:33 PM Sheran Luz wrote: Caitlyn Marshall, with [name], states that they cannot accept requesting for [facility], as they are limited on staffing at this time.

## 2019-08-05 ENCOUNTER — Telehealth: Payer: Self-pay

## 2019-08-05 DIAGNOSIS — R6889 Other general symptoms and signs: Secondary | ICD-10-CM | POA: Diagnosis not present

## 2019-08-05 NOTE — Telephone Encounter (Signed)
Caitlyn Marshall with [facility] needs a letter that states patient does not need prophylactic antibiotic before procedures. She did not say that during the first call. [phone]

## 2019-08-05 NOTE — Telephone Encounter (Signed)
Caitlyn Marshall wants to know if patient needs to be pre-medicated before dental procedures? Please advise.

## 2019-08-05 NOTE — Telephone Encounter (Signed)
Copied from [place] [phone]. Topic: General - Call Back - No Documentation >> Aug 05, 2019 12:24 PM Erick Blinks wrote: Best contact: [phone] [facility] needs a call back from clinic on behalf of patient.

## 2019-08-05 NOTE — Telephone Encounter (Signed)
She doesn't need any prophylactic antibiotic if that it is what she means.

## 2019-08-10 DIAGNOSIS — J439 Emphysema, unspecified: Secondary | ICD-10-CM | POA: Diagnosis not present

## 2019-08-10 DIAGNOSIS — E11319 Type 2 diabetes mellitus with unspecified diabetic retinopathy without macular edema: Secondary | ICD-10-CM | POA: Diagnosis not present

## 2019-08-10 DIAGNOSIS — D631 Anemia in chronic kidney disease: Secondary | ICD-10-CM | POA: Diagnosis not present

## 2019-08-10 DIAGNOSIS — Z87891 Personal history of nicotine dependence: Secondary | ICD-10-CM | POA: Diagnosis not present

## 2019-08-10 DIAGNOSIS — M1990 Unspecified osteoarthritis, unspecified site: Secondary | ICD-10-CM | POA: Diagnosis not present

## 2019-08-10 DIAGNOSIS — I129 Hypertensive chronic kidney disease with stage 1 through stage 4 chronic kidney disease, or unspecified chronic kidney disease: Secondary | ICD-10-CM | POA: Diagnosis not present

## 2019-08-10 DIAGNOSIS — E7800 Pure hypercholesterolemia, unspecified: Secondary | ICD-10-CM | POA: Diagnosis not present

## 2019-08-10 DIAGNOSIS — E034 Atrophy of thyroid (acquired): Secondary | ICD-10-CM | POA: Diagnosis not present

## 2019-08-10 DIAGNOSIS — M109 Gout, unspecified: Secondary | ICD-10-CM | POA: Diagnosis not present

## 2019-08-10 DIAGNOSIS — G4700 Insomnia, unspecified: Secondary | ICD-10-CM | POA: Diagnosis not present

## 2019-08-10 DIAGNOSIS — K5904 Chronic idiopathic constipation: Secondary | ICD-10-CM | POA: Diagnosis not present

## 2019-08-10 DIAGNOSIS — Z9181 History of falling: Secondary | ICD-10-CM | POA: Diagnosis not present

## 2019-08-10 DIAGNOSIS — E1022 Type 1 diabetes mellitus with diabetic chronic kidney disease: Secondary | ICD-10-CM | POA: Diagnosis not present

## 2019-08-10 DIAGNOSIS — E1051 Type 1 diabetes mellitus with diabetic peripheral angiopathy without gangrene: Secondary | ICD-10-CM | POA: Diagnosis not present

## 2019-08-10 DIAGNOSIS — E559 Vitamin D deficiency, unspecified: Secondary | ICD-10-CM | POA: Diagnosis not present

## 2019-08-10 DIAGNOSIS — N184 Chronic kidney disease, stage 4 (severe): Secondary | ICD-10-CM | POA: Diagnosis not present

## 2019-08-11 DIAGNOSIS — M1990 Unspecified osteoarthritis, unspecified site: Secondary | ICD-10-CM | POA: Diagnosis not present

## 2019-08-11 DIAGNOSIS — J309 Allergic rhinitis, unspecified: Secondary | ICD-10-CM | POA: Diagnosis not present

## 2019-08-11 DIAGNOSIS — E1122 Type 2 diabetes mellitus with diabetic chronic kidney disease: Secondary | ICD-10-CM | POA: Diagnosis not present

## 2019-08-11 DIAGNOSIS — D631 Anemia in chronic kidney disease: Secondary | ICD-10-CM | POA: Diagnosis not present

## 2019-08-11 DIAGNOSIS — E039 Hypothyroidism, unspecified: Secondary | ICD-10-CM | POA: Diagnosis not present

## 2019-08-11 DIAGNOSIS — N184 Chronic kidney disease, stage 4 (severe): Secondary | ICD-10-CM | POA: Diagnosis not present

## 2019-08-11 DIAGNOSIS — M719 Bursopathy, unspecified: Secondary | ICD-10-CM | POA: Diagnosis not present

## 2019-08-11 DIAGNOSIS — M1030 Gout due to renal impairment, unspecified site: Secondary | ICD-10-CM | POA: Diagnosis not present

## 2019-08-11 DIAGNOSIS — E1151 Type 2 diabetes mellitus with diabetic peripheral angiopathy without gangrene: Secondary | ICD-10-CM | POA: Diagnosis not present

## 2019-08-11 DIAGNOSIS — N390 Urinary tract infection, site not specified: Secondary | ICD-10-CM | POA: Diagnosis not present

## 2019-08-11 DIAGNOSIS — I129 Hypertensive chronic kidney disease with stage 1 through stage 4 chronic kidney disease, or unspecified chronic kidney disease: Secondary | ICD-10-CM | POA: Diagnosis not present

## 2019-08-11 DIAGNOSIS — K5900 Constipation, unspecified: Secondary | ICD-10-CM | POA: Diagnosis not present

## 2019-08-12 ENCOUNTER — Ambulatory Visit: Payer: Medicare Other | Admitting: Pulmonary Disease

## 2019-08-12 ENCOUNTER — Telehealth: Payer: Self-pay

## 2019-08-12 ENCOUNTER — Telehealth: Payer: Self-pay | Admitting: Hospice and Palliative Medicine

## 2019-08-12 DIAGNOSIS — R911 Solitary pulmonary nodule: Secondary | ICD-10-CM

## 2019-08-12 NOTE — Telephone Encounter (Signed)
Nutrition Assessment   Reason for Assessment:   Patient identified on Malnutrition Screening report for weight loss and poor appetite   ASSESSMENT:  79 year old female with presumed lung cancer, did not tolerate biopsy.  Patient not a candidate for systemic chemotherapy.  Planning radiation therapy.  Past medical history of CKD, HTN, HLD, DM.  Followed by Dr. [name] and Palliative care.  Spoke with patient via phone to introduce self and service at [facility].  Patient reports that appetite is a little bit better.  For about 3 weeks she reports no appetite.  Yesterday was able to eat a chicken biscuit for breakfast.  Lunch was hamburger with fries (ate most of burger and few fries).  Supper last night was burger.  Reports that her niece brings her food if she does not feel like cooking.  Drinks ensure and boost but not everyday.  "If I get a taste for them I will drink one."  Reports that her stomach hurts, describes as crampy pain.  Says that she has a bowel movement daily. When asked about nausea states "once in awhile"   Medications: reviewed   Labs: reviewed   Anthropometrics:   Height: 66 inches Weight: 91 lb 1.6 oz on 5/19 March 100lb BMI: 14  9% weight loss in the last 2 months, significant   Estimated Energy Needs  Kcals: 1200-1400 Protein: 60-70 g Fluid: > 1.2 L   NUTRITION DIAGNOSIS: Inadequate oral intake related to poor appetite, cancer as evidenced by 9% weight loss in the last 2 months   MALNUTRITION DIAGNOSIS: Likely with BMI of 14  INTERVENTION:  Encouraged patient to discuss stomach pain with MD. She says she will see him next week.   Discussed ways to increase calories and protein.  Will mail High Calorie, High Protein handout from [employer].   Encouraged drinking high calorie oral nutrition supplement daily. Will mail coupons.   Will mail contact information   MONITORING, EVALUATION, GOAL: weight trends, intake   Next Visit: June 24 phone f/u  Landers Prajapati B.  Zenia Resides, [place], [place] Registered Dietitian [phone] (pager)

## 2019-08-12 NOTE — Telephone Encounter (Signed)
I received a message from Dr. Patsey Berthold office that patient had declined and to consider hospice involvement.  We will then will reach patient by phone but did speak with her daughter, Caitlyn Marshall, who is her POA but lives in [place].  We discussed patient's overall status and plan.  Daughter says that patient is frustrated at going to so many MD appointments.  She says the patient was at the dentist today and became "enraged."  Patient is scheduled to see Dr. Baruch Gouty next week and daughter thinks that patient would want to try RT if it is felt that it would help.  However, we discussed that RT is often daily for weeks at a time and given patient is already frustrated with medical care, I wonder if she would tolerate that.  We did discuss the option of hospice daughter does not think that patient is ready for that.  She is in agreement with community-based palliative care.  They have been consulted but has yet to see the patient.  We discussed CODE STATUS.  Daughter does not think that patient would want to be resuscitated nor have her life prolonged artificially machines but she is reviewing the ACP documents and wants to see if patient is lucid enough to have a conversation regarding her goals prior to any final decisions.

## 2019-08-13 ENCOUNTER — Telehealth: Payer: Self-pay | Admitting: Nurse Practitioner

## 2019-08-13 NOTE — Telephone Encounter (Signed)
Spoke with patient's daughter Lind Covert [facility]) regarding Palliative services and all questions were answered and she was in agreement with starting services.  I have scheduled an In-person Consult for 08/26/19 @ 11:30 AM.

## 2019-08-17 ENCOUNTER — Encounter: Payer: Self-pay | Admitting: *Deleted

## 2019-08-17 ENCOUNTER — Other Ambulatory Visit: Payer: Self-pay

## 2019-08-17 ENCOUNTER — Ambulatory Visit
Admission: RE | Admit: 2019-08-17 | Discharge: 2019-08-17 | Disposition: A | Discharge status: Home / Self Care | Payer: Medicare Other | Source: Ambulatory Visit | Attending: Radiation Oncology | Admitting: Radiation Oncology

## 2019-08-17 VITALS — BP 206/73 | HR 72 | Temp 98.1°F | Resp 16 | Wt 89.2 lb

## 2019-08-17 DIAGNOSIS — C3411 Malignant neoplasm of upper lobe, right bronchus or lung: Secondary | ICD-10-CM

## 2019-08-17 DIAGNOSIS — M109 Gout, unspecified: Secondary | ICD-10-CM | POA: Diagnosis not present

## 2019-08-17 DIAGNOSIS — D631 Anemia in chronic kidney disease: Secondary | ICD-10-CM | POA: Diagnosis not present

## 2019-08-17 DIAGNOSIS — N184 Chronic kidney disease, stage 4 (severe): Secondary | ICD-10-CM | POA: Diagnosis not present

## 2019-08-17 DIAGNOSIS — E1051 Type 1 diabetes mellitus with diabetic peripheral angiopathy without gangrene: Secondary | ICD-10-CM | POA: Diagnosis not present

## 2019-08-17 DIAGNOSIS — Z87891 Personal history of nicotine dependence: Secondary | ICD-10-CM | POA: Diagnosis not present

## 2019-08-17 DIAGNOSIS — E1022 Type 1 diabetes mellitus with diabetic chronic kidney disease: Secondary | ICD-10-CM | POA: Diagnosis not present

## 2019-08-17 DIAGNOSIS — Z9181 History of falling: Secondary | ICD-10-CM | POA: Diagnosis not present

## 2019-08-17 DIAGNOSIS — E7800 Pure hypercholesterolemia, unspecified: Secondary | ICD-10-CM | POA: Diagnosis not present

## 2019-08-17 DIAGNOSIS — E11319 Type 2 diabetes mellitus with unspecified diabetic retinopathy without macular edema: Secondary | ICD-10-CM | POA: Diagnosis not present

## 2019-08-17 DIAGNOSIS — G4700 Insomnia, unspecified: Secondary | ICD-10-CM | POA: Diagnosis not present

## 2019-08-17 DIAGNOSIS — R911 Solitary pulmonary nodule: Secondary | ICD-10-CM | POA: Diagnosis not present

## 2019-08-17 DIAGNOSIS — I129 Hypertensive chronic kidney disease with stage 1 through stage 4 chronic kidney disease, or unspecified chronic kidney disease: Secondary | ICD-10-CM | POA: Diagnosis not present

## 2019-08-17 DIAGNOSIS — E559 Vitamin D deficiency, unspecified: Secondary | ICD-10-CM | POA: Diagnosis not present

## 2019-08-17 DIAGNOSIS — M1990 Unspecified osteoarthritis, unspecified site: Secondary | ICD-10-CM | POA: Diagnosis not present

## 2019-08-17 DIAGNOSIS — K5904 Chronic idiopathic constipation: Secondary | ICD-10-CM | POA: Diagnosis not present

## 2019-08-17 DIAGNOSIS — E034 Atrophy of thyroid (acquired): Secondary | ICD-10-CM | POA: Diagnosis not present

## 2019-08-17 DIAGNOSIS — J439 Emphysema, unspecified: Secondary | ICD-10-CM | POA: Diagnosis not present

## 2019-08-17 NOTE — Progress Notes (Signed)
  Oncology Nurse Navigator Documentation  Navigator Location: [facility] (08/17/19 1600)   )Navigator Encounter Type: Initial RadOnc (08/17/19 1600)                       Treatment Phase: Pre-Tx/Tx Discussion (08/17/19 1600) Barriers/Navigation Needs: No Barriers At This Time (08/17/19 1600)   Interventions: None Required (08/17/19 1600)         met with patient during initial rad-onc consultation with Dr. Baruch Gouty to discuss palliative radiation. All questions answered during visit. Reviewed upcoming appts. Nothing further needed at this time. Instructed to call back with any future questions or needs. Pt and her family verbalized understanding.             Time Spent with Patient: 30 (08/17/19 1600)

## 2019-08-17 NOTE — Consult Note (Signed)
NEW PATIENT EVALUATION  Name: Caitlyn Marshall  MRN: [id number]  Date:   08/17/2019     DOB: 08/31/1940   This 79 y.o. 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 Lung Cancer    Initial consultation    DIAGNOSIS: There were no encounter diagnoses.   PREVIOUS INVESTIGATIONS:  Clinical notes reviewed PET/CT and CT scans reviewed MRI of brain reviewed   HPI: Patient is a 79 year old female with multiple medical comorbidities including adult onset diabetes hypertension.  She was hospitalized in April with altered mental status secondary to a TIA.  She does have chronic fatigue as well as anorexia.  She was found to have a small left pleural effusion and increased like masslike consolidation within the lingula back in March 2021.  Most recent PET CT scan back in April showed hypermetabolic mass in the left chest wall with contralateral mediastinal adenopathy suspicious for primary bronchogenic neoplasm locally advanced in nature stage IIIb.  There was mild increased hypermetabolic activity in the right hilar nodes.  MRI of the brain showed no evidence to suggest metastatic disease.  She did have mild small vessel change of the white matter.  She was sent to pulmonology although deemed based on comorbidities not a suitable candidate for biopsy.  She is now referred to radiation oncology for consideration of palliative treatment.  She specifically denies hemoptysis Mark chest tightness change in mental status.  PLANNED TREATMENT REGIMEN: Palliative radiation therapy to her chest  PAST MEDICAL HISTORY:  has a past medical history of Chronic kidney disease, Diabetes mellitus without complication ([place]), Gout, Hyperlipidemia, Hypertension, Hypothyroidism, and Thyroid disease.    PAST SURGICAL HISTORY:  Past Surgical History:   Procedure Laterality Date  . CATARACT EXTRACTION W/PHACO Left 05/26/2018   Procedure: CATARACT EXTRACTION PHACO AND INTRAOCULAR LENS PLACEMENT ([place])  LEFT DIABETIC;  Surgeon: Leandrew Koyanagi, MD;  Location: [facility];  Service: Ophthalmology;  Laterality: Left;  Diabetic - insulin  . TUBAL LIGATION    . Vascular Stent in right leg      FAMILY HISTORY: family history includes Alzheimer's disease in her mother; Cancer in her son; Diabetes in her mother; Hypertension in her mother.  SOCIAL HISTORY:  reports that she quit smoking about 12 years ago. Her smoking use included cigarettes. She has a 15.00 pack-year smoking history. She has never used smokeless tobacco. She reports that she does not drink alcohol or use drugs.  ALLERGIES: Patient has no known allergies.  MEDICATIONS:  Current Outpatient Medications  Medication Sig Dispense Refill  . albuterol (VENTOLIN HFA) 108 (90 Base) MCG/ACT inhaler Inhale 2 puffs into the lungs every 6 (six) hours as needed for wheezing or shortness of breath. 18 g 5  . allopurinol (ZYLOPRIM) 100 MG tablet TAKE 2 TABLETS(200 MG) BY MOUTH TWICE DAILY 360 tablet 3  . ALPRAZolam (XANAX) 0.25 MG tablet Take 1 tablet (0.25 mg total) by mouth every 6 (six) hours as needed. 30 tablet 1  . amLODipine (NORVASC) 10 MG tablet Take 10 mg by mouth daily.    Marland Kitchen ascorbic acid (VITAMIN C) 500 MG tablet Take 500 mg by mouth daily.    Marland Kitchen aspirin EC 81 MG tablet Take 81 mg by mouth daily.     . Cholecalciferol (VITAMIN D-3) 125 MCG (5000 UT) TABS Take 5,000 Units by mouth daily.    Marland Kitchen  Continuous Blood Gluc Receiver (FREESTYLE LIBRE READER) DEVI Use to check blood sugar four times daily for insulin dependent diabetes 1 each 0  . Continuous Blood Gluc Sensor (FREESTYLE LIBRE 14 DAY SENSOR) MISC Place 1 Device onto the skin every 14 (fourteen) days. for insulin dependent type 2 diabetes 2 each 12  . donepezil (ARICEPT) 10 MG tablet Take 1 tablet (10 mg total) by mouth at  bedtime. 90 tablet 1  . doxazosin (CARDURA) 4 MG tablet Take 4 mg by mouth at bedtime.    . insulin NPH Human (HUMULIN N) 100 UNIT/ML injection Inject 6-7 Units into the skin See admin instructions. Inject 6 units subcutaneously before breakfast and 7 units before supper 10 mL 11  . insulin regular (HUMULIN R) 100 units/mL injection Inject 6 units with breakfast, 8 units with lunch and 10 units with supper 10 mL 11  . Insulin Syringe-Needle U-100 (INSULIN SYRINGE .5CC/30GX5/16") 30G X 5/16" 0.5 ML MISC U QID UTD  5  . irbesartan (AVAPRO) 300 MG tablet Take 300 mg by mouth every evening.  8  . levothyroxine (SYNTHROID, LEVOTHROID) 50 MCG tablet Take 1 tablet by mouth daily.  5  . linaclotide (LINZESS) 145 MCG CAPS capsule Take 1 capsule (145 mcg total) by mouth daily before breakfast. 90 capsule 1  . lovastatin (MEVACOR) 20 MG tablet TAKE 1 TABLET BY MOUTH DAILY 90 tablet 3  . megestrol (MEGACE) 20 MG tablet Take 1 tablet (20 mg total) by mouth daily. 30 tablet 5  . mirtazapine (REMERON) 15 MG tablet Take 1 tablet (15 mg total) by mouth at bedtime. 90 tablet 1  . montelukast (SINGULAIR) 10 MG tablet TAKE 1 TABLET BY MOUTH EVERY DAY 90 tablet 4  . ondansetron (ZOFRAN) 4 MG tablet Take 4 mg by mouth every 8 (eight) hours as needed for nausea or vomiting.     . ONE TOUCH ULTRA TEST test strip TEST THREE TIMES DAILY 100 each 5  . ONETOUCH DELICA LANCETS 47W MISC INJECT AS DIRECTED THREE TIMES DAILY     No current facility-administered medications for this encounter.    ECOG PERFORMANCE STATUS:  1 - Symptomatic but completely ambulatory  REVIEW OF SYSTEMS: Patient denies any weight loss, fatigue, weakness, fever, chills or night sweats. Patient denies any loss of vision, blurred vision. Patient denies any ringing  of the ears or hearing loss. No irregular heartbeat. Patient denies heart murmur or history of fainting. Patient denies any chest pain or pain radiating to her upper extremities. Patient  denies any shortness of breath, difficulty breathing at night, cough or hemoptysis. Patient denies any swelling in the lower legs. Patient denies any nausea vomiting, vomiting of blood, or coffee ground material in the vomitus. Patient denies any stomach pain. Patient states has had normal bowel movements no significant constipation or diarrhea. Patient denies any dysuria, hematuria or significant nocturia. Patient denies any problems walking, swelling in the joints or loss of balance. Patient denies any skin changes, loss of hair or loss of weight. Patient denies any excessive worrying or anxiety or significant depression. Patient denies any problems with insomnia. Patient denies excessive thirst, polyuria, polydipsia. Patient denies any swollen glands, patient denies easy bruising or easy bleeding. Patient denies any recent infections, allergies or URI. Patient "s visual fields have not changed significantly in recent time.   PHYSICAL EXAM: BP (!) 206/73   Pulse 72   Temp 98.1 F (36.7 C)   Resp 16   Wt 89 lb 3.2 oz (40.5  kg)   SpO2 95%   BMI 14.40 kg/m  Thin cachectic female in NAD wheelchair-bound.  Well-developed well-nourished patient in NAD. HEENT reveals PERLA, EOMI, discs not visualized.  Oral cavity is clear. No oral mucosal lesions are identified. Neck is clear without evidence of cervical or supraclavicular adenopathy. Lungs are clear to A&P. Cardiac examination is essentially unremarkable with regular rate and rhythm without murmur rub or thrill. Abdomen is benign with no organomegaly or masses noted. Motor sensory and DTR levels are equal and symmetric in the upper and lower extremities. Cranial nerves II through XII are grossly intact. Proprioception is intact. No peripheral adenopathy or edema is identified. No motor or sensory levels are noted. Crude visual fields are within normal range.  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  We will incorporate all areas of PET positive disease.  Would plan on delivering 4000 cGy in 20 fractions.  I will then reevaluate her for possible small field boost.  Risks and benefits of treatment including skin reaction fatigue alteration of blood counts possible radiation esophagitis all were described in detail to the patient and her daughter.  Both seem to comprehend my treatment plan well.  I have personally set up and ordered CT simulation for later this week.  I would like to take this opportunity to thank you for allowing me to participate in the care of your patient.Noreene Filbert, MD

## 2019-08-19 ENCOUNTER — Encounter: Payer: Self-pay | Admitting: *Deleted

## 2019-08-19 ENCOUNTER — Ambulatory Visit
Admission: RE | Admit: 2019-08-19 | Discharge: 2019-08-19 | Disposition: A | Discharge status: Home / Self Care | Payer: Medicare Other | Source: Ambulatory Visit | Attending: Radiation Oncology | Admitting: Radiation Oncology

## 2019-08-19 DIAGNOSIS — C3492 Malignant neoplasm of unspecified part of left bronchus or lung: Secondary | ICD-10-CM | POA: Diagnosis not present

## 2019-08-19 DIAGNOSIS — R911 Solitary pulmonary nodule: Secondary | ICD-10-CM | POA: Diagnosis not present

## 2019-08-19 DIAGNOSIS — Z510 Encounter for antineoplastic radiation therapy: Secondary | ICD-10-CM | POA: Insufficient documentation

## 2019-08-19 NOTE — Progress Notes (Signed)
  Oncology Nurse Navigator Documentation  Navigator Location: [facility] (08/19/19 1500)   )Navigator Encounter Type: Appt/Treatment Plan Review (08/19/19 1500)                     Patient Visit Type: RadOnc (08/19/19 1500)   Barriers/Navigation Needs: No Barriers At This Time (08/19/19 1500)   Interventions: None Required (08/19/19 1500)                      Time Spent with Patient: 15 (08/19/19 1500)

## 2019-08-20 ENCOUNTER — Other Ambulatory Visit: Payer: Self-pay | Admitting: *Deleted

## 2019-08-20 ENCOUNTER — Telehealth: Payer: Self-pay | Admitting: Nurse Practitioner

## 2019-08-20 DIAGNOSIS — I129 Hypertensive chronic kidney disease with stage 1 through stage 4 chronic kidney disease, or unspecified chronic kidney disease: Secondary | ICD-10-CM

## 2019-08-20 DIAGNOSIS — C3411 Malignant neoplasm of upper lobe, right bronchus or lung: Secondary | ICD-10-CM

## 2019-08-20 DIAGNOSIS — E1051 Type 1 diabetes mellitus with diabetic peripheral angiopathy without gangrene: Secondary | ICD-10-CM

## 2019-08-20 DIAGNOSIS — E1022 Type 1 diabetes mellitus with diabetic chronic kidney disease: Secondary | ICD-10-CM

## 2019-08-20 DIAGNOSIS — M1990 Unspecified osteoarthritis, unspecified site: Secondary | ICD-10-CM

## 2019-08-20 DIAGNOSIS — N184 Chronic kidney disease, stage 4 (severe): Secondary | ICD-10-CM

## 2019-08-20 DIAGNOSIS — F0390 Unspecified dementia without behavioral disturbance: Secondary | ICD-10-CM

## 2019-08-20 DIAGNOSIS — M109 Gout, unspecified: Secondary | ICD-10-CM

## 2019-08-20 NOTE — Telephone Encounter (Signed)
Called and left message with daughter, [name], to let her know that we were changing the time of the apointment on 08/26/19 from 11:30 AM to 12 Noon.  Left my name and contact number and requested a call back if this was a problem.

## 2019-08-23 ENCOUNTER — Telehealth: Payer: Self-pay | Admitting: *Deleted

## 2019-08-23 NOTE — Telephone Encounter (Signed)
Patient daughter called reporting that patient is having severe stomach cramps and that she is always cold. She reports that patient weight is 80 pounds. She is asking for something to be done for her. Please advise

## 2019-08-23 NOTE — Telephone Encounter (Signed)
I spoke with Caitlyn Marshall and she reports that she has not yet seen the patient and asked if [name], NP could see her. When I called patient daughter, she said that patient has appointment Thursday and that she is very weak and did not want to come out again and so I offered the Palliative visit to be virtual and she accepted and gave me her brothers number to call for appointment. Which is on the appointment note. Caitlyn asked that [name] update her on the visit

## 2019-08-23 NOTE — Telephone Encounter (Signed)
Patient was previously offered hospice, but I think she declined. it looks like she saw Christin Gusler (APP with [facility]) several days ago.  Maybe she can go by again to evaluate and rediscuss enrolling in hospice.

## 2019-08-24 ENCOUNTER — Inpatient Hospital Stay: Payer: Medicare Other | Attending: Hospice and Palliative Medicine | Admitting: Hospice and Palliative Medicine

## 2019-08-24 DIAGNOSIS — M109 Gout, unspecified: Secondary | ICD-10-CM | POA: Diagnosis not present

## 2019-08-24 DIAGNOSIS — D631 Anemia in chronic kidney disease: Secondary | ICD-10-CM | POA: Diagnosis not present

## 2019-08-24 DIAGNOSIS — E1051 Type 1 diabetes mellitus with diabetic peripheral angiopathy without gangrene: Secondary | ICD-10-CM | POA: Diagnosis not present

## 2019-08-24 DIAGNOSIS — J439 Emphysema, unspecified: Secondary | ICD-10-CM | POA: Diagnosis not present

## 2019-08-24 DIAGNOSIS — M1990 Unspecified osteoarthritis, unspecified site: Secondary | ICD-10-CM | POA: Diagnosis not present

## 2019-08-24 DIAGNOSIS — I129 Hypertensive chronic kidney disease with stage 1 through stage 4 chronic kidney disease, or unspecified chronic kidney disease: Secondary | ICD-10-CM | POA: Diagnosis not present

## 2019-08-24 DIAGNOSIS — R197 Diarrhea, unspecified: Secondary | ICD-10-CM | POA: Diagnosis not present

## 2019-08-24 DIAGNOSIS — E559 Vitamin D deficiency, unspecified: Secondary | ICD-10-CM | POA: Diagnosis not present

## 2019-08-24 DIAGNOSIS — Z9181 History of falling: Secondary | ICD-10-CM | POA: Diagnosis not present

## 2019-08-24 DIAGNOSIS — E7800 Pure hypercholesterolemia, unspecified: Secondary | ICD-10-CM | POA: Diagnosis not present

## 2019-08-24 DIAGNOSIS — R911 Solitary pulmonary nodule: Secondary | ICD-10-CM

## 2019-08-24 DIAGNOSIS — E1022 Type 1 diabetes mellitus with diabetic chronic kidney disease: Secondary | ICD-10-CM | POA: Diagnosis not present

## 2019-08-24 DIAGNOSIS — R531 Weakness: Secondary | ICD-10-CM | POA: Diagnosis not present

## 2019-08-24 DIAGNOSIS — Z87891 Personal history of nicotine dependence: Secondary | ICD-10-CM | POA: Diagnosis not present

## 2019-08-24 DIAGNOSIS — N184 Chronic kidney disease, stage 4 (severe): Secondary | ICD-10-CM | POA: Diagnosis not present

## 2019-08-24 DIAGNOSIS — K5904 Chronic idiopathic constipation: Secondary | ICD-10-CM | POA: Diagnosis not present

## 2019-08-24 DIAGNOSIS — Z515 Encounter for palliative care: Secondary | ICD-10-CM

## 2019-08-24 DIAGNOSIS — E11319 Type 2 diabetes mellitus with unspecified diabetic retinopathy without macular edema: Secondary | ICD-10-CM | POA: Diagnosis not present

## 2019-08-24 DIAGNOSIS — E034 Atrophy of thyroid (acquired): Secondary | ICD-10-CM | POA: Diagnosis not present

## 2019-08-24 DIAGNOSIS — G4700 Insomnia, unspecified: Secondary | ICD-10-CM | POA: Diagnosis not present

## 2019-08-24 MED ORDER — OXYCODONE HCL 5 MG PO TABS: 2.5000 mg | ORAL_TABLET | Freq: Four times a day (QID) | ORAL | 0 refills | Status: DC | PRN

## 2019-08-24 NOTE — Progress Notes (Signed)
[place]  Telephone:([phone] [phone]   Name: Caitlyn Marshall Date: 08/24/2019 MRN: [id number]  DOB: [birth year]  Patient Care Team: Birdie Sons, MD as PCP - General (Family Medicine) [name], OD as Consulting Physician (Optometry) [name], [name], MD as Physician Assistant (Endocrinology) [name], MD as Consulting Physician (Internal Medicine) [name], MD as Consulting Physician (Pulmonary Disease) [name], RN as Oncology Nurse Navigator [name], [name], MD as Consulting Physician (Oncology)    REASON FOR CONSULTATION: Caitlyn Marshall is a 79 y.o. 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  Patient was hospitalized 07/06/2019 to 07/14/2019 with altered mental status thought secondary to a TIA but etiology was unclear.  Patient is being followed by medical oncology but is thought not to be a candidate for systemic treatment.  She was referred for RT.  Palliative care was consulted to help address goals and manage ongoing symptoms.  SOCIAL HISTORY:     reports that she quit smoking about 12 years ago. Her smoking use included cigarettes. She has a 15.00 pack-year smoking history. She has never used smokeless tobacco. She reports that she does not drink alcohol or use drugs.   Patient is unmarried.  She lives at home with her son and grandson.  She has two daughters who live nearby.  Patient had another son who is now deceased.  ADVANCE DIRECTIVES:  Does not have  CODE STATUS: DNR/DNI (form completed on 08/24/19)  PAST MEDICAL HISTORY: Past Medical History:  Diagnosis Date   Chronic kidney disease    Diabetes mellitus without complication ([place])    Gout    Hyperlipidemia    Hypertension    Hypothyroidism    Thyroid disease     PAST SURGICAL HISTORY:    Past Surgical History:  Procedure Laterality Date   CATARACT EXTRACTION W/PHACO Left 05/26/2018   Procedure: CATARACT EXTRACTION PHACO AND INTRAOCULAR LENS PLACEMENT ([place])  LEFT DIABETIC;  Surgeon: Leandrew Koyanagi, MD;  Location: [facility];  Service: Ophthalmology;  Laterality: Left;  Diabetic - insulin   TUBAL LIGATION     Vascular Stent in right leg      HEMATOLOGY/ONCOLOGY HISTORY:  Oncology History   No history exists.    ALLERGIES:  has No Known Allergies.  MEDICATIONS:  Current Outpatient Medications  Medication Sig Dispense Refill   albuterol (VENTOLIN HFA) 108 (90 Base) MCG/ACT inhaler Inhale 2 puffs into the lungs every 6 (six) hours as needed for wheezing or shortness of breath. 18 g 5   allopurinol (ZYLOPRIM) 100 MG tablet TAKE 2 TABLETS(200 MG) BY MOUTH TWICE DAILY 360 tablet 3   ALPRAZolam (XANAX) 0.25 MG tablet Take 1 tablet (0.25 mg total) by mouth every 6 (six) hours as needed. 30 tablet 1   amLODipine (NORVASC) 10 MG tablet Take 10 mg by mouth daily.     ascorbic acid (VITAMIN C) 500 MG tablet Take 500 mg by mouth daily.     aspirin EC 81 MG tablet Take 81 mg by mouth daily.      Cholecalciferol (VITAMIN D-3) 125 MCG (5000 UT) TABS Take 5,000 Units by mouth daily.     Continuous Blood Gluc Receiver (FREESTYLE LIBRE READER) DEVI Use to check blood sugar four times daily for insulin dependent diabetes 1 each 0   Continuous Blood Gluc Sensor (FREESTYLE LIBRE 14 DAY SENSOR) MISC  Place 1 Device onto the skin every 14 (fourteen) days. for insulin dependent type 2 diabetes 2 each 12   donepezil (ARICEPT) 10 MG tablet Take 1 tablet (10 mg total) by mouth at bedtime. 90 tablet 1   doxazosin (CARDURA) 4 MG tablet Take 4 mg by mouth at bedtime.     insulin NPH Human (HUMULIN N) 100 UNIT/ML injection Inject 6-7 Units into the skin See admin instructions. Inject 6 units subcutaneously before breakfast and 7 units before supper 10 mL 11   insulin  regular (HUMULIN R) 100 units/mL injection Inject 6 units with breakfast, 8 units with lunch and 10 units with supper 10 mL 11   Insulin Syringe-Needle U-100 (INSULIN SYRINGE .5CC/30GX5/16") 30G X 5/16" 0.5 ML MISC U QID UTD  5   irbesartan (AVAPRO) 300 MG tablet Take 300 mg by mouth every evening.  8   levothyroxine (SYNTHROID, LEVOTHROID) 50 MCG tablet Take 1 tablet by mouth daily.  5   linaclotide (LINZESS) 145 MCG CAPS capsule Take 1 capsule (145 mcg total) by mouth daily before breakfast. 90 capsule 1   lovastatin (MEVACOR) 20 MG tablet TAKE 1 TABLET BY MOUTH DAILY 90 tablet 3   megestrol (MEGACE) 20 MG tablet Take 1 tablet (20 mg total) by mouth daily. 30 tablet 5   mirtazapine (REMERON) 15 MG tablet Take 1 tablet (15 mg total) by mouth at bedtime. 90 tablet 1   montelukast (SINGULAIR) 10 MG tablet TAKE 1 TABLET BY MOUTH EVERY DAY 90 tablet 4   ondansetron (ZOFRAN) 4 MG tablet Take 4 mg by mouth every 8 (eight) hours as needed for nausea or vomiting.      ONE TOUCH ULTRA TEST test strip TEST THREE TIMES DAILY 100 each 5   ONETOUCH DELICA LANCETS 35K MISC INJECT AS DIRECTED THREE TIMES DAILY     No current facility-administered medications for this visit.    VITAL SIGNS: There were no vitals taken for this visit. There were no vitals filed for this visit.  Estimated body mass index is 14.4 kg/m as calculated from the following:   Height as of 07/22/19: 5\' 6"  (1.676 m).   Weight as of 08/17/19: 89 lb 3.2 oz (40.5 kg).  LABS: CBC:    Component Value Date/Time   WBC 8.5 07/23/2019 1409   WBC 8.1 07/10/2019 1248   HGB 11.4 07/23/2019 1409   HCT 36.5 07/23/2019 1409   PLT 307 07/23/2019 1409   MCV 82 07/23/2019 1409   MCV 90 05/04/2013 1411   NEUTROABS 6.0 07/23/2019 1409   NEUTROABS 7.6 (H) 04/28/2012 1301   LYMPHSABS 1.4 07/23/2019 1409   LYMPHSABS 1.6 04/28/2012 1301   MONOABS 1.3 (H) 07/08/2019 0200   MONOABS 0.8 04/28/2012 1301   EOSABS 0.3 07/23/2019 1409    EOSABS 0.2 04/28/2012 1301   BASOSABS 0.1 07/23/2019 1409   BASOSABS 2 06/29/2012 0431   BASOSABS 0.1 04/28/2012 1301   Comprehensive Metabolic Panel:    Component Value Date/Time   NA 141 07/23/2019 1409   NA 142 05/07/2013 0450   K 4.5 07/23/2019 1409   K 4.8 05/07/2013 0450   CL 103 07/23/2019 1409   CL 116 (H) 05/07/2013 0450   CO2 24 07/23/2019 1409   CO2 21 05/07/2013 0450   BUN 17 07/23/2019 1409   BUN 18 05/07/2013 0450   CREATININE 1.59 (H) 07/23/2019 1409   CREATININE 1.76 (H) 05/07/2013 0450   GLUCOSE 190 (H) 07/23/2019 1409   GLUCOSE 116 (H) 07/13/2019 0938  GLUCOSE 195 (H) 05/07/2013 0450   CALCIUM 9.6 07/23/2019 1409   CALCIUM 8.7 05/07/2013 0450   AST 21 07/10/2019 1248   AST 23 05/04/2013 1411   ALT 12 07/10/2019 1248   ALT 13 05/04/2013 1411   ALKPHOS 81 07/10/2019 1248   ALKPHOS 192 (H) 05/04/2013 1411   BILITOT 0.9 07/10/2019 1248   BILITOT 0.4 05/31/2019 1059   BILITOT 0.6 05/04/2013 1411   PROT 6.4 (L) 07/10/2019 1248   PROT 6.6 05/31/2019 1059   PROT 7.4 05/04/2013 1411   ALBUMIN 3.2 (L) 07/10/2019 1248   ALBUMIN 4.1 05/31/2019 1059   ALBUMIN 3.5 05/04/2013 1411    RADIOGRAPHIC STUDIES: No results found.  PERFORMANCE STATUS (ECOG) : 2 - Symptomatic, <50% confined to bed  Review of Systems Unless otherwise noted, a complete review of systems is negative.  Physical Exam General: NAD, emaciated appearing with severe temporal wasting Pulmonary: Unlabored Extremities: no edema, no joint deformities Skin: no rashes Neurological: Weakness but otherwise nonfocal  IMPRESSION: Patient was an add-on to my schedule today for a virtual visit due to abdominal pain/diarrhea.  I had a virtual visit with patient, niece, and nephew.  Patient reports that she has had loose stools over the past few days.  She denies fever or chills.  No nausea or vomiting.  No changes in medications.  Oral intake has been chronically poor and patient is eating only  sips/bites.  Family report that she continues to lose weight.  Patient has generalized abdominal pain but it sounds like she is nonfocal.  She is not currently taking anything for abdominal pain.  I discussed a clinic visit for more thorough assessment of her abdominal pain/diarrhea but patient declined.  She says she is not interested in clinic visits.  I suggested that she discontinue the Linzess as that can cause diarrhea.  She can try OTC Imodium.  It would also be reasonable to start her on low-dose oxycodone for pain.  I attempted to have a goals of care conversation as it sounds like patient is declining and may be nearing end-of-life.  Hospice has been previously offered but family wanted to try RT.  Patient was undecided on these decisions today.  I called and spoke with her daughter, [name], who is her healthcare power of attorney.  I updated [name] on my virtual visit today and we discussed the plan for pain medications, antidiarrheals, and the recommendation for hospice.  [name] says that although she lives in [place], she has cameras in the home and she has been watching patient decline daily. She says that she will speak with her sister and cousin about hospice care and let us know of the decision.  Also discussed CODE STATUS with patient's daughter.  Daughter stated clearly that she would not want patient to be resuscitated nor have her life prolonged artificially machines.  She verbalized agreement with DNR/DNI.  I will mail a DNR form to patient's home.  PLAN: -Best supportive care -Recommend hospice referral -Oxycodone 2.5-5mg  Q6H PRN for pain (#45 tablets) -May use Immodium for diarrhea -Discontinue Linzess -DNR/DNI (order was mailed to patient's home)   Patient expressed understanding and was in agreement with this plan. She also understands that She can call the clinic at any time with any questions, concerns, or complaints.     Time Total: 45 minutes  Visit  consisted of counseling and education dealing with the complex and emotionally intense issues of symptom management and palliative care in the setting of serious  and potentially life-threatening illness.Greater than 50%  of this time was spent counseling and coordinating care related to the above assessment and plan.  Signed by: Altha Harm, PhD, NP-C

## 2019-08-25 DIAGNOSIS — R911 Solitary pulmonary nodule: Secondary | ICD-10-CM | POA: Diagnosis not present

## 2019-08-25 DIAGNOSIS — Z510 Encounter for antineoplastic radiation therapy: Secondary | ICD-10-CM | POA: Diagnosis not present

## 2019-08-25 DIAGNOSIS — C3492 Malignant neoplasm of unspecified part of left bronchus or lung: Secondary | ICD-10-CM | POA: Diagnosis not present

## 2019-08-26 ENCOUNTER — Telehealth: Payer: Self-pay | Admitting: Nurse Practitioner

## 2019-08-26 ENCOUNTER — Other Ambulatory Visit: Payer: Medicare Other | Admitting: Nurse Practitioner

## 2019-08-26 ENCOUNTER — Ambulatory Visit: Admission: RE | Admit: 2019-08-26 | Payer: Medicare Other | Source: Ambulatory Visit

## 2019-08-26 ENCOUNTER — Other Ambulatory Visit: Payer: Self-pay

## 2019-08-26 DIAGNOSIS — Z510 Encounter for antineoplastic radiation therapy: Secondary | ICD-10-CM | POA: Diagnosis not present

## 2019-08-26 DIAGNOSIS — C3492 Malignant neoplasm of unspecified part of left bronchus or lung: Secondary | ICD-10-CM | POA: Diagnosis not present

## 2019-08-26 DIAGNOSIS — R911 Solitary pulmonary nodule: Secondary | ICD-10-CM | POA: Diagnosis not present

## 2019-08-26 NOTE — Telephone Encounter (Signed)
I called Caitlyn Marshall daughter to check about in person visit today with Caitlyn Marshall as I attempted to contact the home and no answer. Caitlyn Marshall endorses Caitlyn Marshall went for her "dry run" at the cancer center this am and wishes to reschedule. Caitlyn Marshall endorses she would like to schedule when she will be here. Rescheduled for 09/06/2019 at 4pm

## 2019-08-30 ENCOUNTER — Inpatient Hospital Stay
Admission: EM | Admit: 2019-08-30 | Discharge: 2019-09-04 | DRG: 638 | Disposition: A | Discharge status: Hospice | Payer: Medicare Other | Attending: Internal Medicine | Admitting: Internal Medicine

## 2019-08-30 ENCOUNTER — Telehealth: Payer: Self-pay | Admitting: Family Medicine

## 2019-08-30 ENCOUNTER — Ambulatory Visit: Payer: Medicare Other

## 2019-08-30 ENCOUNTER — Emergency Department: Payer: Medicare Other

## 2019-08-30 ENCOUNTER — Other Ambulatory Visit: Payer: Self-pay

## 2019-08-30 ENCOUNTER — Encounter: Payer: Self-pay | Admitting: Emergency Medicine

## 2019-08-30 DIAGNOSIS — E039 Hypothyroidism, unspecified: Secondary | ICD-10-CM | POA: Diagnosis not present

## 2019-08-30 DIAGNOSIS — F0390 Unspecified dementia without behavioral disturbance: Secondary | ICD-10-CM | POA: Diagnosis present

## 2019-08-30 DIAGNOSIS — Z681 Body mass index (BMI) 19 or less, adult: Secondary | ICD-10-CM

## 2019-08-30 DIAGNOSIS — Z20822 Contact with and (suspected) exposure to covid-19: Secondary | ICD-10-CM | POA: Diagnosis not present

## 2019-08-30 DIAGNOSIS — N179 Acute kidney failure, unspecified: Secondary | ICD-10-CM | POA: Diagnosis present

## 2019-08-30 DIAGNOSIS — C3492 Malignant neoplasm of unspecified part of left bronchus or lung: Secondary | ICD-10-CM | POA: Diagnosis present

## 2019-08-30 DIAGNOSIS — F439 Reaction to severe stress, unspecified: Secondary | ICD-10-CM

## 2019-08-30 DIAGNOSIS — Z794 Long term (current) use of insulin: Secondary | ICD-10-CM

## 2019-08-30 DIAGNOSIS — Z961 Presence of intraocular lens: Secondary | ICD-10-CM | POA: Diagnosis not present

## 2019-08-30 DIAGNOSIS — K861 Other chronic pancreatitis: Secondary | ICD-10-CM | POA: Diagnosis not present

## 2019-08-30 DIAGNOSIS — E1051 Type 1 diabetes mellitus with diabetic peripheral angiopathy without gangrene: Secondary | ICD-10-CM | POA: Diagnosis not present

## 2019-08-30 DIAGNOSIS — J439 Emphysema, unspecified: Secondary | ICD-10-CM | POA: Diagnosis not present

## 2019-08-30 DIAGNOSIS — Z515 Encounter for palliative care: Secondary | ICD-10-CM | POA: Diagnosis not present

## 2019-08-30 DIAGNOSIS — I169 Hypertensive crisis, unspecified: Secondary | ICD-10-CM | POA: Diagnosis present

## 2019-08-30 DIAGNOSIS — E860 Dehydration: Secondary | ICD-10-CM | POA: Diagnosis present

## 2019-08-30 DIAGNOSIS — N390 Urinary tract infection, site not specified: Secondary | ICD-10-CM | POA: Diagnosis not present

## 2019-08-30 DIAGNOSIS — N184 Chronic kidney disease, stage 4 (severe): Secondary | ICD-10-CM | POA: Diagnosis not present

## 2019-08-30 DIAGNOSIS — E1022 Type 1 diabetes mellitus with diabetic chronic kidney disease: Secondary | ICD-10-CM | POA: Diagnosis present

## 2019-08-30 DIAGNOSIS — E785 Hyperlipidemia, unspecified: Secondary | ICD-10-CM | POA: Diagnosis present

## 2019-08-30 DIAGNOSIS — R64 Cachexia: Secondary | ICD-10-CM | POA: Diagnosis not present

## 2019-08-30 DIAGNOSIS — E10649 Type 1 diabetes mellitus with hypoglycemia without coma: Principal | ICD-10-CM | POA: Diagnosis present

## 2019-08-30 DIAGNOSIS — E1065 Type 1 diabetes mellitus with hyperglycemia: Secondary | ICD-10-CM | POA: Diagnosis present

## 2019-08-30 DIAGNOSIS — R627 Adult failure to thrive: Secondary | ICD-10-CM | POA: Diagnosis not present

## 2019-08-30 DIAGNOSIS — J90 Pleural effusion, not elsewhere classified: Secondary | ICD-10-CM | POA: Diagnosis not present

## 2019-08-30 DIAGNOSIS — D631 Anemia in chronic kidney disease: Secondary | ICD-10-CM

## 2019-08-30 DIAGNOSIS — Z79899 Other long term (current) drug therapy: Secondary | ICD-10-CM

## 2019-08-30 DIAGNOSIS — M109 Gout, unspecified: Secondary | ICD-10-CM | POA: Diagnosis present

## 2019-08-30 DIAGNOSIS — I129 Hypertensive chronic kidney disease with stage 1 through stage 4 chronic kidney disease, or unspecified chronic kidney disease: Secondary | ICD-10-CM | POA: Diagnosis present

## 2019-08-30 DIAGNOSIS — R109 Unspecified abdominal pain: Secondary | ICD-10-CM | POA: Diagnosis not present

## 2019-08-30 DIAGNOSIS — Z8249 Family history of ischemic heart disease and other diseases of the circulatory system: Secondary | ICD-10-CM

## 2019-08-30 DIAGNOSIS — N1832 Chronic kidney disease, stage 3b: Secondary | ICD-10-CM | POA: Diagnosis not present

## 2019-08-30 DIAGNOSIS — E162 Hypoglycemia, unspecified: Secondary | ICD-10-CM | POA: Diagnosis present

## 2019-08-30 DIAGNOSIS — Z7189 Other specified counseling: Secondary | ICD-10-CM

## 2019-08-30 DIAGNOSIS — R9431 Abnormal electrocardiogram [ECG] [EKG]: Secondary | ICD-10-CM | POA: Diagnosis present

## 2019-08-30 DIAGNOSIS — E1029 Type 1 diabetes mellitus with other diabetic kidney complication: Secondary | ICD-10-CM | POA: Diagnosis not present

## 2019-08-30 DIAGNOSIS — I10 Essential (primary) hypertension: Secondary | ICD-10-CM | POA: Diagnosis not present

## 2019-08-30 DIAGNOSIS — Z79890 Hormone replacement therapy (postmenopausal): Secondary | ICD-10-CM

## 2019-08-30 DIAGNOSIS — F419 Anxiety disorder, unspecified: Secondary | ICD-10-CM

## 2019-08-30 DIAGNOSIS — E11319 Type 2 diabetes mellitus with unspecified diabetic retinopathy without macular edema: Secondary | ICD-10-CM

## 2019-08-30 DIAGNOSIS — Z66 Do not resuscitate: Secondary | ICD-10-CM | POA: Diagnosis not present

## 2019-08-30 DIAGNOSIS — Z743 Need for continuous supervision: Secondary | ICD-10-CM | POA: Diagnosis not present

## 2019-08-30 DIAGNOSIS — Z9851 Tubal ligation status: Secondary | ICD-10-CM

## 2019-08-30 DIAGNOSIS — Z87891 Personal history of nicotine dependence: Secondary | ICD-10-CM

## 2019-08-30 DIAGNOSIS — Z833 Family history of diabetes mellitus: Secondary | ICD-10-CM

## 2019-08-30 DIAGNOSIS — M5489 Other dorsalgia: Secondary | ICD-10-CM | POA: Diagnosis not present

## 2019-08-30 DIAGNOSIS — Z9842 Cataract extraction status, left eye: Secondary | ICD-10-CM

## 2019-08-30 DIAGNOSIS — Z8673 Personal history of transient ischemic attack (TIA), and cerebral infarction without residual deficits: Secondary | ICD-10-CM

## 2019-08-30 DIAGNOSIS — R6889 Other general symptoms and signs: Secondary | ICD-10-CM | POA: Diagnosis not present

## 2019-08-30 DIAGNOSIS — M1990 Unspecified osteoarthritis, unspecified site: Secondary | ICD-10-CM

## 2019-08-30 DIAGNOSIS — N200 Calculus of kidney: Secondary | ICD-10-CM | POA: Diagnosis not present

## 2019-08-30 DIAGNOSIS — I739 Peripheral vascular disease, unspecified: Secondary | ICD-10-CM | POA: Diagnosis present

## 2019-08-30 LAB — BASIC METABOLIC PANEL
Anion gap: 11 (ref 5–15)
BUN: 17 mg/dL (ref 8–23)
CO2: 27 mmol/L (ref 22–32)
Calcium: 8.8 mg/dL — ABNORMAL LOW (ref 8.9–10.3)
Chloride: 108 mmol/L (ref 98–111)
Creatinine, Ser: 1.84 mg/dL — ABNORMAL HIGH (ref 0.44–1.00)
GFR calc Af Amer: 30 mL/min — ABNORMAL LOW (ref >60)
GFR calc non Af Amer: 26 mL/min — ABNORMAL LOW (ref >60)
Glucose, Bld: 49 mg/dL — ABNORMAL LOW (ref 70–99)
Potassium: 3.2 mmol/L — ABNORMAL LOW (ref 3.5–5.1)
Sodium: 146 mmol/L — ABNORMAL HIGH (ref 135–145)

## 2019-08-30 LAB — SARS CORONAVIRUS 2 BY RT PCR (HOSPITAL ORDER, PERFORMED IN ~~LOC~~ HOSPITAL LAB): SARS Coronavirus 2: NEGATIVE

## 2019-08-30 LAB — CBC
HCT: 38.4 % (ref 36.0–46.0)
Hemoglobin: 12.8 g/dL (ref 12.0–15.0)
MCH: 26.6 pg (ref 26.0–34.0)
MCHC: 33.3 g/dL (ref 30.0–36.0)
MCV: 79.7 fL — ABNORMAL LOW (ref 80.0–100.0)
Platelets: 261 10*3/uL (ref 150–400)
RBC: 4.82 MIL/uL (ref 3.87–5.11)
RDW: 21.3 % — ABNORMAL HIGH (ref 11.5–15.5)
WBC: 12.7 10*3/uL — ABNORMAL HIGH (ref 4.0–10.5)
nRBC: 0 % (ref 0.0–0.2)

## 2019-08-30 LAB — GLUCOSE, CAPILLARY
Glucose-Capillary: 51 mg/dL — ABNORMAL LOW (ref 70–99)
Glucose-Capillary: 214 mg/dL — ABNORMAL HIGH (ref 70–99)
Glucose-Capillary: 48 mg/dL — ABNORMAL LOW (ref 70–99)
Glucose-Capillary: 267 mg/dL — ABNORMAL HIGH (ref 70–99)

## 2019-08-30 LAB — URINALYSIS, COMPLETE (UACMP) WITH MICROSCOPIC
Bacteria, UA: NONE SEEN
Bilirubin Urine: NEGATIVE
Glucose, UA: NEGATIVE mg/dL
Hgb urine dipstick: NEGATIVE
Ketones, ur: NEGATIVE mg/dL
Nitrite: NEGATIVE
Protein, ur: >=300 mg/dL — AB
Specific Gravity, Urine: 1.012 (ref 1.005–1.030)
pH: 5 (ref 5.0–8.0)

## 2019-08-30 LAB — TROPONIN I (HIGH SENSITIVITY)
Troponin I (High Sensitivity): 39 ng/L — ABNORMAL HIGH (ref <18)
Troponin I (High Sensitivity): 37 ng/L — ABNORMAL HIGH (ref <18)

## 2019-08-30 MED ORDER — SODIUM CHLORIDE 0.9 % IV SOLN: Freq: Once | INTRAVENOUS | Status: DC

## 2019-08-30 MED ORDER — INSULIN ASPART 100 UNIT/ML ~~LOC~~ SOLN
0.0000 [IU] | Freq: Three times a day (TID) | SUBCUTANEOUS | Status: DC
  Administered 2019-08-31 – 2019-09-01 (×3): 3 [IU] via SUBCUTANEOUS
  Administered 2019-09-01: 2 [IU] via SUBCUTANEOUS
  Administered 2019-09-02: 13:00:00 3 [IU] via SUBCUTANEOUS
  Administered 2019-09-02: 19:00:00 2 [IU] via SUBCUTANEOUS
  Administered 2019-09-02 – 2019-09-03 (×2): 1 [IU] via SUBCUTANEOUS
  Administered 2019-09-04: 09:00:00 2 [IU] via SUBCUTANEOUS
  Filled 2019-08-30 (×9): qty 1

## 2019-08-30 MED ORDER — DOXAZOSIN MESYLATE 4 MG PO TABS
4.0000 mg | ORAL_TABLET | Freq: Every day | ORAL | Status: DC
  Administered 2019-08-30 – 2019-09-03 (×4): 4 mg via ORAL
  Filled 2019-08-30 (×6): qty 1

## 2019-08-30 MED ORDER — ORAL CARE MOUTH RINSE
15.0000 mL | Freq: Two times a day (BID) | OROMUCOSAL | Status: DC
  Administered 2019-08-31 – 2019-09-04 (×8): 15 mL via OROMUCOSAL

## 2019-08-30 MED ORDER — SODIUM CHLORIDE 0.9 % IV SOLN: INTRAVENOUS | Status: DC

## 2019-08-30 MED ORDER — OXYCODONE HCL 5 MG PO TABS: 2.5000 mg | ORAL_TABLET | Freq: Four times a day (QID) | ORAL | Status: DC | PRN

## 2019-08-30 MED ORDER — ONDANSETRON HCL 4 MG/2ML IJ SOLN: 4.0000 mg | Freq: Four times a day (QID) | INTRAMUSCULAR | Status: DC | PRN

## 2019-08-30 MED ORDER — ONDANSETRON HCL 4 MG PO TABS
4.0000 mg | ORAL_TABLET | Freq: Four times a day (QID) | ORAL | Status: DC | PRN
  Administered 2019-09-01: 02:00:00 4 mg via ORAL
  Filled 2019-08-30: qty 1

## 2019-08-30 MED ORDER — ALBUTEROL SULFATE (2.5 MG/3ML) 0.083% IN NEBU: 3.0000 mL | INHALATION_SOLUTION | Freq: Four times a day (QID) | RESPIRATORY_TRACT | Status: DC | PRN

## 2019-08-30 MED ORDER — ALLOPURINOL 100 MG PO TABS
100.0000 mg | ORAL_TABLET | Freq: Every day | ORAL | Status: DC
  Administered 2019-08-30 – 2019-09-04 (×6): 100 mg via ORAL
  Filled 2019-08-30 (×6): qty 1

## 2019-08-30 MED ORDER — CLONIDINE HCL 0.1 MG PO TABS
0.2000 mg | ORAL_TABLET | Freq: Once | ORAL | Status: AC
  Administered 2019-08-30: 0.2 mg via ORAL
  Filled 2019-08-30: qty 2

## 2019-08-30 MED ORDER — ACETAMINOPHEN 325 MG PO TABS
650.0000 mg | ORAL_TABLET | Freq: Four times a day (QID) | ORAL | Status: DC | PRN
  Administered 2019-09-01: 650 mg via ORAL
  Filled 2019-08-30: qty 2

## 2019-08-30 MED ORDER — LINACLOTIDE 145 MCG PO CAPS
145.0000 ug | ORAL_CAPSULE | Freq: Every day | ORAL | Status: DC
  Administered 2019-08-31 – 2019-09-04 (×5): 145 ug via ORAL
  Filled 2019-08-30 (×6): qty 1

## 2019-08-30 MED ORDER — AMLODIPINE BESYLATE 10 MG PO TABS
10.0000 mg | ORAL_TABLET | Freq: Every day | ORAL | Status: DC
  Administered 2019-08-30 – 2019-09-04 (×6): 10 mg via ORAL
  Filled 2019-08-30 (×6): qty 1

## 2019-08-30 MED ORDER — MEGESTROL ACETATE 20 MG PO TABS
20.0000 mg | ORAL_TABLET | Freq: Every day | ORAL | Status: DC
  Administered 2019-08-30 – 2019-09-04 (×6): 20 mg via ORAL
  Filled 2019-08-30 (×9): qty 1

## 2019-08-30 MED ORDER — LEVOTHYROXINE SODIUM 50 MCG PO TABS
50.0000 ug | ORAL_TABLET | Freq: Every day | ORAL | Status: DC
  Administered 2019-08-31 – 2019-09-04 (×3): 50 ug via ORAL
  Filled 2019-08-30 (×4): qty 1

## 2019-08-30 MED ORDER — MONTELUKAST SODIUM 10 MG PO TABS
10.0000 mg | ORAL_TABLET | Freq: Every day | ORAL | Status: DC
  Administered 2019-08-30 – 2019-09-04 (×6): 10 mg via ORAL
  Filled 2019-08-30 (×6): qty 1

## 2019-08-30 MED ORDER — HYDRALAZINE HCL 20 MG/ML IJ SOLN
10.0000 mg | Freq: Four times a day (QID) | INTRAMUSCULAR | Status: DC | PRN
  Filled 2019-08-30: qty 1

## 2019-08-30 MED ORDER — ACETAMINOPHEN 500 MG PO TABS
1000.0000 mg | ORAL_TABLET | Freq: Once | ORAL | Status: AC
  Administered 2019-08-30: 1000 mg via ORAL
  Filled 2019-08-30: qty 2

## 2019-08-30 MED ORDER — ENOXAPARIN SODIUM 40 MG/0.4ML ~~LOC~~ SOLN: 40.0000 mg | SUBCUTANEOUS | Status: DC

## 2019-08-30 MED ORDER — ACETAMINOPHEN 650 MG RE SUPP: 650.0000 mg | Freq: Four times a day (QID) | RECTAL | Status: DC | PRN

## 2019-08-30 MED ORDER — ASPIRIN EC 81 MG PO TBEC
81.0000 mg | DELAYED_RELEASE_TABLET | Freq: Every day | ORAL | Status: DC
  Administered 2019-08-30 – 2019-09-04 (×6): 81 mg via ORAL
  Filled 2019-08-30 (×6): qty 1

## 2019-08-30 MED ORDER — DONEPEZIL HCL 5 MG PO TABS
10.0000 mg | ORAL_TABLET | Freq: Every day | ORAL | Status: DC
  Administered 2019-08-30 – 2019-09-03 (×4): 10 mg via ORAL
  Filled 2019-08-30 (×5): qty 2

## 2019-08-30 MED ORDER — SODIUM CHLORIDE 0.9 % IV SOLN
1.0000 g | Freq: Once | INTRAVENOUS | Status: AC
  Administered 2019-08-30: 1 g via INTRAVENOUS
  Filled 2019-08-30 (×2): qty 10

## 2019-08-30 MED ORDER — PRAVASTATIN SODIUM 20 MG PO TABS
20.0000 mg | ORAL_TABLET | Freq: Every day | ORAL | Status: DC
  Administered 2019-08-30 – 2019-09-02 (×4): 20 mg via ORAL
  Filled 2019-08-30 (×5): qty 1

## 2019-08-30 MED ORDER — DEXTROSE 50 % IV SOLN
INTRAVENOUS | Status: AC
  Administered 2019-08-30: 50 mL
  Filled 2019-08-30: qty 50

## 2019-08-30 MED ORDER — ALPRAZOLAM 0.25 MG PO TABS
0.2500 mg | ORAL_TABLET | Freq: Four times a day (QID) | ORAL | Status: DC | PRN
  Administered 2019-09-02 – 2019-09-04 (×3): 0.25 mg via ORAL
  Filled 2019-08-30 (×3): qty 1

## 2019-08-30 MED ORDER — MIRTAZAPINE 15 MG PO TABS
15.0000 mg | ORAL_TABLET | Freq: Every day | ORAL | Status: DC
  Administered 2019-08-30 – 2019-09-03 (×4): 15 mg via ORAL
  Filled 2019-08-30 (×5): qty 1

## 2019-08-30 MED ORDER — DEXTROSE-NACL 5-0.9 % IV SOLN: INTRAVENOUS | Status: DC

## 2019-08-30 MED ORDER — ASCORBIC ACID 500 MG PO TABS
500.0000 mg | ORAL_TABLET | Freq: Every day | ORAL | Status: DC
  Administered 2019-08-30 – 2019-09-04 (×6): 500 mg via ORAL
  Filled 2019-08-30 (×6): qty 1

## 2019-08-30 MED ORDER — POTASSIUM CHLORIDE CRYS ER 20 MEQ PO TBCR
40.0000 meq | EXTENDED_RELEASE_TABLET | Freq: Once | ORAL | Status: AC
  Administered 2019-08-30: 40 meq via ORAL
  Filled 2019-08-30: qty 2

## 2019-08-30 MED ORDER — ENOXAPARIN SODIUM 30 MG/0.3ML ~~LOC~~ SOLN
30.0000 mg | SUBCUTANEOUS | Status: DC
  Administered 2019-08-30 – 2019-09-01 (×3): 30 mg via SUBCUTANEOUS
  Filled 2019-08-30 (×3): qty 0.3

## 2019-08-30 NOTE — ED Triage Notes (Addendum)
First Nurse Note:  Arrives via [facility] for C/O back and abdominal pain.  Patient has history of lung CA and family stated that patient is a DNR.  Per report, VS wnl.  Patient's niece is in the parking lot.  Phone number [phone]

## 2019-08-30 NOTE — ED Notes (Signed)
Pt updated on room assignment. Pt resting calmly in bed. Bed locked low. Rails up. Call bell within reach.

## 2019-08-30 NOTE — ED Triage Notes (Signed)
Patient to ER via [facility] for c/o back pain ("all over") x2 days. Patient denies abd pain or chest pain. Patient hypertensive in triage, states she has not had HTN medication yet this am.

## 2019-08-30 NOTE — Progress Notes (Signed)
PHARMACIST - PHYSICIAN COMMUNICATION  CONCERNING:  Enoxaparin (Lovenox) for DVT Prophylaxis    RECOMMENDATION: Patient was prescribed enoxaprin 40mg  q24 hours for VTE prophylaxis.   Filed Weights   08/30/19 0934  Weight: 40.5 kg (89 lb 3.2 oz)    Body mass index is 14.4 kg/m.  Estimated Creatinine Clearance: 16.4 mL/min (A) (by C-G formula based on SCr of 1.84 mg/dL (H)).   Patient is candidate for enoxaparin 30mg  every 24 hours based on CrCl <22ml/min   DESCRIPTION: Pharmacy has adjusted enoxaparin dose per [facility] policy.  Patient is now receiving enoxaparin 30mg  every 24 hours.  Lu Duffel, PharmD Clinical Pharmacist  08/30/2019 2:59 PM

## 2019-08-30 NOTE — ED Provider Notes (Signed)
[facility] Emergency Department Provider Note ____________________________________________   First MD Initiated Contact with Patient 08/30/19 1011     (approximate)  I have reviewed the triage vital signs and the nursing notes.   HISTORY  Chief Complaint Back Pain    HPI Caitlyn Marshall is a 79 y.o. female with PMH as noted below presents with multiple symptoms over the last few days, specifically abdominal and low back pain, frequent urination, and somewhat loose stools.  The patient states that she has also had nausea.  She reports that all the symptoms have now resolved.  The family member present states that whenever she eats the food "goes right through her."  The patient's daughter who is out of state is concerned that the patient is becoming weaker and thinks she may need admission.  Past Medical History:  Diagnosis Date  . Chronic kidney disease   . Diabetes mellitus without complication ([place])   . Gout   . Hyperlipidemia   . Hypertension   . Hypothyroidism   . Thyroid disease     Patient Active Problem List   Diagnosis Date Noted  . Hypertensive crisis 08/30/2019  . Altered mental status 07/08/2019  . Diabetic retinopathy ([place]) 02/23/2018  . Nodule of lower lobe of left lung 04/17/2017  . Other disorders of lung 04/17/2017  . Aortic atherosclerosis ([place]) 03/18/2017  . Insomnia 07/12/2015  . Hypertension 07/12/2015  . Poor appetite 07/12/2015  . Constipation 07/12/2015  . Bursitis 05/31/2015  . 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 Leg swelling 05/31/2015  . Allergic rhinitis 12/26/2014  . Vitamin D deficiency 12/20/2014  . Anxiety 12/20/2014  . Hypercholesterolemia 12/20/2014  . Anemia 12/20/2014  . Hypothyroidism 12/20/2014  . Hypertensive CKD (chronic kidney disease) 12/20/2014  . Premature ventricular contraction 12/20/2014  . PVD (peripheral vascular disease) ([place]) 12/20/2014    . Gout 12/20/2014  . Osteoarthritis 12/20/2014  . Renal insufficiency 12/20/2014    Past Surgical History:  Procedure Laterality Date  . CATARACT EXTRACTION W/PHACO Left 05/26/2018   Procedure: CATARACT EXTRACTION PHACO AND INTRAOCULAR LENS PLACEMENT ([place])  LEFT DIABETIC;  Surgeon: Leandrew Koyanagi, MD;  Location: [facility];  Service: Ophthalmology;  Laterality: Left;  Diabetic - insulin  . TUBAL LIGATION    . Vascular Stent in right leg      Prior to Admission medications   Medication Sig Start Date End Date Taking? Authorizing Provider  albuterol (VENTOLIN HFA) 108 (90 Base) MCG/ACT inhaler Inhale 2 puffs into the lungs every 6 (six) hours as needed for wheezing or shortness of breath. 07/22/19   [name], MD  allopurinol (ZYLOPRIM) 100 MG tablet TAKE 2 TABLETS(200 MG) BY MOUTH TWICE DAILY 11/13/18   Birdie Sons, MD  ALPRAZolam Duanne Moron) 0.25 MG tablet Take 1 tablet (0.25 mg total) by mouth every 6 (six) hours as needed. 07/23/19   Mar Daring, PA-C  amLODipine (NORVASC) 10 MG tablet Take 10 mg by mouth daily. 12/21/14   [provider]  ascorbic acid (VITAMIN C) 500 MG tablet Take 500 mg by mouth daily.    [provider]  aspirin EC 81 MG tablet Take 81 mg by mouth daily.     [provider]  Cholecalciferol (VITAMIN D-3) 125 MCG (5000 UT) TABS Take 5,000 Units by mouth daily.    [provider]  Continuous Blood Gluc Receiver (FREESTYLE LIBRE READER) DEVI Use to check blood sugar four times daily  for insulin dependent diabetes 07/29/19   Birdie Sons, MD  Continuous Blood Gluc Sensor (FREESTYLE LIBRE 14 DAY SENSOR) MISC Place 1 Device onto the skin every 14 (fourteen) days. for insulin dependent type 2 diabetes 07/30/19   Birdie Sons, MD  donepezil (ARICEPT) 10 MG tablet Take 1 tablet (10 mg total) by mouth at bedtime. 07/23/19   Mar Daring, PA-C  doxazosin (CARDURA) 4 MG tablet Take 4 mg by mouth at  bedtime. 07/05/19   [provider]  insulin NPH Human (HUMULIN N) 100 UNIT/ML injection Inject 6-7 Units into the skin See admin instructions. Inject 6 units subcutaneously before breakfast and 7 units before supper 07/29/19   Birdie Sons, MD  insulin regular (HUMULIN R) 100 units/mL injection Inject 6 units with breakfast, 8 units with lunch and 10 units with supper 07/29/19   Birdie Sons, MD  Insulin Syringe-Needle U-100 (INSULIN SYRINGE .5CC/30GX5/16") 30G X 5/16" 0.5 ML MISC U QID UTD 09/13/14   [provider]  irbesartan (AVAPRO) 300 MG tablet Take 300 mg by mouth every evening. 01/02/17   [provider]  levothyroxine (SYNTHROID, LEVOTHROID) 50 MCG tablet Take 1 tablet by mouth daily. 10/21/14   [provider]  linaclotide Rolan Lipa) 145 MCG CAPS capsule Take 1 capsule (145 mcg total) by mouth daily before breakfast. 07/23/19   Mar Daring, PA-C  lovastatin (MEVACOR) 20 MG tablet TAKE 1 TABLET BY MOUTH DAILY 03/01/19   Birdie Sons, MD  megestrol (MEGACE) 20 MG tablet Take 1 tablet (20 mg total) by mouth daily. 05/31/19   Birdie Sons, MD  mirtazapine (REMERON) 15 MG tablet Take 1 tablet (15 mg total) by mouth at bedtime. 07/23/19   Mar Daring, PA-C  montelukast (SINGULAIR) 10 MG tablet TAKE 1 TABLET BY MOUTH EVERY DAY 05/29/19   Birdie Sons, MD  ondansetron (ZOFRAN) 4 MG tablet Take 4 mg by mouth every 8 (eight) hours as needed for nausea or vomiting.  08/12/18   [provider]  ONE TOUCH ULTRA TEST test strip TEST THREE TIMES DAILY 09/06/14   Jerrol Banana., MD  [facility] DELICA LANCETS 09W MISC INJECT AS DIRECTED THREE TIMES DAILY 11/30/14   [provider]  oxyCODONE (OXY IR/ROXICODONE) 5 MG immediate release tablet Take 0.5-1 tablets (2.5-5 mg total) by mouth every 6 (six) hours as needed for severe pain. 08/24/19   Borders, Kirt Boys, NP    Allergies Patient has no known allergies.  Family History   Problem Relation Age of Onset  . Hypertension Mother   . Diabetes Mother        Diabetes mellitus Type 2  . Alzheimer's disease Mother   . Cancer Son        died at age 70, esophagus    Social History Social History   Tobacco Use  . Smoking status: Former Smoker    Packs/day: 0.50    Years: 30.00    Pack years: 15.00    Types: Cigarettes    Quit date: 03/18/2007    Years since quitting: 12.4  . Smokeless tobacco: Never Used  . Tobacco comment: Quit smoking in 2008  Vaping Use  . Vaping Use: Never used  Substance Use Topics  . Alcohol use: No    Alcohol/week: 0.0 standard drinks  . Drug use: No    Review of Systems  Constitutional: No fever.  Positive for weakness. Eyes: No redness. ENT: No sore throat. Cardiovascular: Denies chest  pain. Respiratory: Denies shortness of breath. Gastrointestinal: Positive for nausea and loose stools. Genitourinary: Negative for dysuria.  Positive for frequency.  Musculoskeletal: Positive for back pain. Skin: Negative for rash. Neurological: Negative for headaches, focal weakness or numbness.   ____________________________________________   PHYSICAL EXAM:  VITAL SIGNS: ED Triage Vitals  Enc Vitals Group     BP 08/30/19 0924 (!) 202/61     Pulse Rate 08/30/19 0924 64     Resp 08/30/19 0924 18     Temp 08/30/19 0924 99.1 F (37.3 C)     Temp Source 08/30/19 0924 Oral     SpO2 08/30/19 0924 100 %     Weight 08/30/19 0934 89 lb 3.2 oz (40.5 kg)     Height 08/30/19 0934 5\' 6"  (1.676 m)     Head Circumference --      Peak Flow --      Pain Score 08/30/19 0934 9     Pain Loc --      Pain Edu? --      Excl. in [place]? --     Constitutional: Alert and oriented.  Somewhat weak and frail appearing but in no acute distress. Eyes: Conjunctivae are normal.  Head: Atraumatic. Nose: No congestion/rhinnorhea. Mouth/Throat: Mucous membranes are slightly dry. Neck: Normal range of motion.  Cardiovascular: Normal rate, regular  rhythm. Grossly normal heart sounds.  Good peripheral circulation. Respiratory: Normal respiratory effort.  No retractions. Lungs CTAB. Gastrointestinal: Soft and nontender. No distention.  Genitourinary: No flank tenderness. Musculoskeletal: No lower extremity edema.  Extremities warm and well perfused.  No midline spinal tenderness. Neurologic:  Normal speech and language.  Motor intact in all extremities. Skin:  Skin is warm and dry. No rash noted. Psychiatric: Mood and affect are normal. Speech and behavior are normal.  ____________________________________________   LABS (all labs ordered are listed, but only abnormal results are displayed)  Labs Reviewed  BASIC METABOLIC PANEL - Abnormal; Notable for the following components:      Result Value   Sodium 146 (*)    Potassium 3.2 (*)    Glucose, Bld 49 (*)    Creatinine, Ser 1.84 (*)    Calcium 8.8 (*)    GFR calc non Af Amer 26 (*)    GFR calc Af Amer 30 (*)    All other components within normal limits  CBC - Abnormal; Notable for the following components:   WBC 12.7 (*)    MCV 79.7 (*)    RDW 21.3 (*)    All other components within normal limits  URINALYSIS, COMPLETE (UACMP) WITH MICROSCOPIC - Abnormal; Notable for the following components:   Color, Urine YELLOW (*)    APPearance CLEAR (*)    Protein, ur >=300 (*)    Leukocytes,Ua TRACE (*)    All other components within normal limits  TROPONIN I (HIGH SENSITIVITY) - Abnormal; Notable for the following components:   Troponin I (High Sensitivity) 39 (*)    All other components within normal limits  TROPONIN I (HIGH SENSITIVITY) - Abnormal; Notable for the following components:   Troponin I (High Sensitivity) 37 (*)    All other components within normal limits  SARS CORONAVIRUS 2 BY RT PCR (HOSPITAL ORDER, [place] [facility])   ____________________________________________  EKG  ED ECG REPORT I, Arta Silence, the attending physician,  personally viewed and interpreted this ECG.  Date: 08/30/2019 EKG Time: 0933 Rate: 72 Rhythm: normal sinus rhythm QRS Axis: normal Intervals: Borderline QTc ST/T  Wave abnormalities: Nonspecific lateral T wave abnormalities Narrative Interpretation: Nonspecific lateral T wave abnormalities which are new when compared to EKG of 07/08/2019  ____________________________________________  RADIOLOGY  CT abdomen: No acute intra-abdominal abnormality.  Left-sided pleural effusion.  ____________________________________________   PROCEDURES  Procedure(s) performed: No  Procedures  Critical Care performed: No ____________________________________________   INITIAL IMPRESSION / ASSESSMENT AND PLAN / ED COURSE  Pertinent labs & imaging results that were available during my care of the patient were reviewed by me and considered in my medical decision making (see chart for details).  79 year old female with PMH as noted above presents with back and abdominal pain, loose stools, and urinary frequency as well as some generalized weakness.  However, the patient denies any of the symptoms at this moment.  I reviewed the past medical records in epic.  The patient was most recently admitted in April of this year due to an episode of unresponsiveness and altered mental status.  She eventually returned to her baseline and was sent home with home health.  The patient has a history of presumed lung cancer as well as chronic kidney disease.  She is planned for radiation therapy and has also been seen by palliative care but is not on hospice.  On exam today, the patient is somewhat frail-appearing but in no acute distress.  She is significantly hypertensive although states she did not take her medications this morning.  Her other vital signs are normal except for borderline elevated temperature.  The abdomen is soft and nontender.  She has no significant tenderness to the back.  Neurologic exam is nonfocal.   Patient is alert and oriented.  Given the patient's multiple and somewhat nonspecific symptoms, differential is broad but includes dehydration, other metabolic cause, enteritis, colitis, UTI, or less likely cardiac etiology.  At this time the patient has no signs or symptoms of endorgan dysfunction related to the elevated blood pressure.  We will give an antihypertensive, obtain a CT abdomen, lab work-up, and reassess.  ----------------------------------------- 1:12 PM on 08/30/2019 -----------------------------------------  The patient's blood pressure has improved with clonidine.  Her repeat troponin has not significantly changed.  Urinalysis is suggestive of possible UTI, which would go along with the patient's leukocytosis.  Based on discussion with the patient and her family member as well as the significant elevated blood pressure and EKG changes, we will plan for admission.  I discussed the case with Dr. Clementeen Graham from the hospitalist service.  ____________________________________________   FINAL CLINICAL IMPRESSION(S) / ED DIAGNOSES  Final diagnoses:  Urinary tract infection without hematuria, site unspecified  Hypertension, unspecified type  EKG abnormality      NEW MEDICATIONS STARTED DURING THIS VISIT:  New Prescriptions   No medications on file     Note:  This document was prepared using Dragon voice recognition software and may include unintentional dictation errors.    Arta Silence, MD 08/30/19 1313

## 2019-08-30 NOTE — Telephone Encounter (Signed)
Caller name: Cadence  Relation to pt: from [facility]  Call back number: [phone] fax # [phone]    Reason for call:  Faxed over 2x plan of care orders to [phone] for home health the date of service 5/25-6/23. Re faxed again today please not when received.

## 2019-08-30 NOTE — Progress Notes (Addendum)
   08/30/19 1507  Vitals  Temp 97.8 F (36.6 C)  Temp Source Oral  BP (!) 179/34  MAP (mmHg) 75  BP Location Right Arm  BP Method Automatic  Patient Position (if appropriate) Lying  Pulse Rate (!) 55  Resp 17  Level of Consciousness  Level of Consciousness Alert  Oxygen Therapy  SpO2 100 %  O2 Device Room Air  MEWS Score  MEWS Temp 0  MEWS Systolic 0  MEWS Pulse 0  MEWS RR 0  MEWS LOC 0  MEWS Score 0  MEWS Score Color Green  notified md Dhungel of HR no new orders

## 2019-08-30 NOTE — ED Notes (Signed)
This RN went to covid swab patient but patient refused and stated  "I'm sick of this shit". Did not swab patient since she refused.

## 2019-08-30 NOTE — Progress Notes (Addendum)
1751 CBG 51 followed hypoglycemic protocol gave 25g dextrose IV pt refused to drink juice notified MD Dhungel

## 2019-08-30 NOTE — ED Notes (Signed)
Called floor. Will call back soon.

## 2019-08-30 NOTE — ED Notes (Signed)
Attempted for 22g IV at R ac. Repeat trop sent to lab.

## 2019-08-30 NOTE — H&P (Addendum)
History and Physical    STARLETTE THUROW DJT:[id number] DOB: [birth year] DOA: 08/30/2019  PCP: Birdie Sons, MD   Patient coming from: home    Chief Complaint:back pain  HPI: Caitlyn Marshall is a 79 y.o. female with medical history significant of chronic kidney disease stage IIIb, type 2 diabetes mellitus on insulin, hypertension, hypothyroidism, left lung mass and pleural effusion diagnosed in March 2021.  PET scan done showed hypermetabolic mass in the left chest wall with mediastinal adenopathy suspicious for a primary bronchogenic carcinoma (stage IIIb).  She was referred to pulmonary for a biopsy but given her comorbidities did not appear to be a good candidate for it and was referred to radiation oncology for palliative treatment. Given her comorbidities, failure to thrive and possible dementia, she was referred to palliative care and hospice was also offered by family refused it at this time. History provided by ED physician.  Patient reportedly having low back pain, abdominal pain, frequent urination and?  Loose stools for several days associated with nausea.  Patient has been increasingly weak.  In March she was admitted for TIA and altered mental status.  Patient unable to provide any history and think she is in [place] and reports that she lives with her mother. In the ED she was found to have accelerated hypertension with systolic blood pressure of 200/50s, afebrile, normal heart rate and respiratory rate.  UA negative for UTI.  Mildly elevated high-sensitivity troponin and blood work showed potassium of 3.2, glucose of 49, BUN, 17 and worsening creatinine of 1.84 (from baseline of 1.6). CT of the abdomen and pelvis without contrast showed moderate left-sided pleural effusion (increased from prior) with atelectasis and masslike consolidation within the lingula.  Also showed new nodularity along the left heart border with pericardial lymph node versus pleural metastatic  disease. T wave inversion (did not see uploaded).  Patient given clonidine and Tylenol with improvement in her blood pressure. Hospitalist consulted for observation for failure to thrive and accelerated hypertension.    Review of Systems: As per HPI otherwise all other systems reviewed and are negative.  Past Medical History:  Diagnosis Date  . Chronic kidney disease   . Diabetes mellitus without complication ([place])   . Gout   . Hyperlipidemia   . Hypertension   . Hypothyroidism   . Thyroid disease     Past Surgical History:  Procedure Laterality Date  . CATARACT EXTRACTION W/PHACO Left 05/26/2018   Procedure: CATARACT EXTRACTION PHACO AND INTRAOCULAR LENS PLACEMENT ([place])  LEFT DIABETIC;  Surgeon: Leandrew Koyanagi, MD;  Location: [facility];  Service: Ophthalmology;  Laterality: Left;  Diabetic - insulin  . TUBAL LIGATION    . Vascular Stent in right leg      Social History  reports that she quit smoking about 12 years ago. Her smoking use included cigarettes. She has a 15.00 pack-year smoking history. She has never used smokeless tobacco. She reports that she does not drink alcohol and does not use drugs.  No Known Allergies  Family History  Problem Relation Age of Onset  . Hypertension Mother   . Diabetes Mother        Diabetes mellitus Type 2  . Alzheimer's disease Mother   . Cancer Son        died at age 75, esophagus   Prior to Admission medications   Medication Sig Start Date End Date Taking? Authorizing Provider  albuterol (VENTOLIN HFA) 108 (90 Base) MCG/ACT inhaler Inhale 2 puffs into  the lungs every 6 (six) hours as needed for wheezing or shortness of breath. 07/22/19   [name], MD  allopurinol (ZYLOPRIM) 100 MG tablet TAKE 2 TABLETS(200 MG) BY MOUTH TWICE DAILY 11/13/18   Birdie Sons, MD  ALPRAZolam Duanne Moron) 0.25 MG tablet Take 1 tablet (0.25 mg total) by mouth every 6 (six) hours as needed. 07/23/19   Mar Daring, PA-C   amLODipine (NORVASC) 10 MG tablet Take 10 mg by mouth daily. 12/21/14   [provider]  ascorbic acid (VITAMIN C) 500 MG tablet Take 500 mg by mouth daily.    [provider]  aspirin EC 81 MG tablet Take 81 mg by mouth daily.     [provider]  Cholecalciferol (VITAMIN D-3) 125 MCG (5000 UT) TABS Take 5,000 Units by mouth daily.    [provider]  Continuous Blood Gluc Receiver (FREESTYLE LIBRE READER) DEVI Use to check blood sugar four times daily for insulin dependent diabetes 07/29/19   Birdie Sons, MD  Continuous Blood Gluc Sensor (FREESTYLE LIBRE 14 DAY SENSOR) MISC Place 1 Device onto the skin every 14 (fourteen) days. for insulin dependent type 2 diabetes 07/30/19   Birdie Sons, MD  donepezil (ARICEPT) 10 MG tablet Take 1 tablet (10 mg total) by mouth at bedtime. 07/23/19   Mar Daring, PA-C  doxazosin (CARDURA) 4 MG tablet Take 4 mg by mouth at bedtime. 07/05/19   [provider]  insulin NPH Human (HUMULIN N) 100 UNIT/ML injection Inject 6-7 Units into the skin See admin instructions. Inject 6 units subcutaneously before breakfast and 7 units before supper 07/29/19   Birdie Sons, MD  insulin regular (HUMULIN R) 100 units/mL injection Inject 6 units with breakfast, 8 units with lunch and 10 units with supper 07/29/19   Birdie Sons, MD  irbesartan (AVAPRO) 300 MG tablet Take 300 mg by mouth every evening. 01/02/17   [provider]  levothyroxine (SYNTHROID, LEVOTHROID) 50 MCG tablet Take 1 tablet by mouth daily. 10/21/14   [provider]  linaclotide Rolan Lipa) 145 MCG CAPS capsule Take 1 capsule (145 mcg total) by mouth daily before breakfast. 07/23/19   Mar Daring, PA-C  lovastatin (MEVACOR) 20 MG tablet TAKE 1 TABLET BY MOUTH DAILY 03/01/19   Birdie Sons, MD  megestrol (MEGACE) 20 MG tablet Take 1 tablet (20 mg total) by mouth daily. 05/31/19   Birdie Sons, MD  mirtazapine (REMERON) 15  MG tablet Take 1 tablet (15 mg total) by mouth at bedtime. 07/23/19   Mar Daring, PA-C  montelukast (SINGULAIR) 10 MG tablet TAKE 1 TABLET BY MOUTH EVERY DAY 05/29/19   Birdie Sons, MD  ondansetron (ZOFRAN) 4 MG tablet Take 4 mg by mouth every 8 (eight) hours as needed for nausea or vomiting.  08/12/18   [provider]  ONE TOUCH ULTRA TEST test strip TEST THREE TIMES DAILY 09/06/14   Jerrol Banana., MD  oxyCODONE (OXY IR/ROXICODONE) 5 MG immediate release tablet Take 0.5-1 tablets (2.5-5 mg total) by mouth every 6 (six) hours as needed for severe pain. 08/24/19   Borders, Kirt Boys, NP    Physical Exam: Vitals:   08/30/19 1034 08/30/19 1035 08/30/19 1100 08/30/19 1121  BP:   (!) 243/49 (!) 202/58  Pulse: 65 62 66 (!) 59  Resp: 20 20 18 16   Temp:      TempSrc:      SpO2: 98% 99% 99% 96%  Weight:      Height:        Constitutional: NAD, calm, comfortable Vitals:   08/30/19 1034 08/30/19 1035 08/30/19 1100 08/30/19 1121  BP:   (!) 243/49 (!) 202/58  Pulse: 65 62 66 (!) 59  Resp: 20 20 18 16   Temp:      TempSrc:      SpO2: 98% 99% 99% 96%  Weight:      Height:      General: Elderly female lying in bed, appears. Eyes: Pupils reactive bilaterally, normal extraocular movement ENMT: Pallor present, moist mucosa, supple neck, temporal wasting Respiratory: Diminished breath sounds over left lung base, no crackles, rhonchi or wheeze Cardiovascular: Normal S1-S2, no murmur rub or gallop Abdomen: Soft, nondistended, nontender, bowel sounds present Musculoskeletal: Warm, trace edema bilaterally Skin: Normal skin without any rash or erythema Neurologic: Alert and awake, oriented x0, moving all extremities  Labs on Admission: I have personally reviewed following labs and imaging studies  CBC: Recent Labs  Lab 08/30/19 0939  WBC 12.7*  HGB 12.8  HCT 38.4  MCV 79.7*  PLT 267    Basic Metabolic Panel: Recent Labs  Lab 08/30/19 0939  NA 146*  K 3.2*   CL 108  CO2 27  GLUCOSE 49*  BUN 17  CREATININE 1.84*  CALCIUM 8.8*    GFR: Estimated Creatinine Clearance: 16.4 mL/min (A) (by C-G formula based on SCr of 1.84 mg/dL (H)).  Liver Function Tests: No results for input(s): AST, ALT, ALKPHOS, BILITOT, PROT, ALBUMIN in the last 168 hours.  Urine analysis:    Component Value Date/Time   COLORURINE YELLOW (A) 08/30/2019 0939   APPEARANCEUR CLEAR (A) 08/30/2019 0939   APPEARANCEUR Cloudy 05/04/2013 2136   LABSPEC 1.012 08/30/2019 0939   LABSPEC 1.015 05/04/2013 2136   PHURINE 5.0 08/30/2019 0939   GLUCOSEU NEGATIVE 08/30/2019 0939   GLUCOSEU >=500 05/04/2013 2136   HGBUR NEGATIVE 08/30/2019 0939   BILIRUBINUR NEGATIVE 08/30/2019 0939   BILIRUBINUR Negative 05/04/2013 2136   KETONESUR NEGATIVE 08/30/2019 0939   PROTEINUR >=300 (A) 08/30/2019 0939   NITRITE NEGATIVE 08/30/2019 0939   LEUKOCYTESUR TRACE (A) 08/30/2019 0939   LEUKOCYTESUR 3+ 05/04/2013 2136    Radiological Exams on Admission: CT ABDOMEN PELVIS WO CONTRAST  Result Date: 08/30/2019 CLINICAL DATA:  Back pain, abdominal pain.  History of lung cancer EXAM: CT ABDOMEN AND PELVIS WITHOUT CONTRAST TECHNIQUE: Multidetector CT imaging of the abdomen and pelvis was performed following the standard protocol without IV contrast. COMPARISON:  CT 06/15/2019 FINDINGS: Lower chest: Moderate sized left-sided pleural effusion with associated atelectasis. Partially visualized masslike consolidation within the lingula. Nodularity along the left heart border may represent pericardial lymph nodes or areas of pleural metastatic disease (series 2, images 8-10). This is more prominent in appearance compared to prior. Hepatobiliary: Stable small hypoattenuating hepatic lesions. No new focal hepatic mass. Gallbladder appears unremarkable. No hyperdense gallstone or biliary dilatation. Pancreas: Pancreatic parenchymal atrophy with numerous coarse calcifications reflecting sequela of chronic  pancreatitis. No peripancreatic inflammatory changes or fluid. Spleen: Unremarkable. Adrenals/Urinary Tract: No adrenal mass identified. Bilateral punctate nonobstructing renal calculi. No hydronephrosis. Urinary bladder appears unremarkable. Stomach/Bowel: No evidence of bowel obstruction or acute bowel inflammation, although evaluation is limited by patient's cachexia and lack of intra-abdominal fat. Appendix not definitively visualized. Vascular/Lymphatic: Marked aortoiliac atherosclerotic calcification without evidence of aneurysm. No definite abdominopelvic lymphadenopathy within the limitations of this exam. Reproductive: Grossly unremarkable. Other: Unchanged soft tissue prominence along the lower anterior abdominal wall, nonspecific, but  may be injection related. No ascites or intra-abdominal fluid collection. No free intraperitoneal air. Musculoskeletal: No acute or significant osseous findings. No suspicious osseous lesion IMPRESSION: 1. Moderate sized left-sided pleural effusion with associated atelectasis, slightly increased in size from prior. Partially visualized mass-like consolidation within the lingula. 2. Nodularity along the left heart border may represent pericardial lymph nodes or areas of pleural metastatic disease. This is more prominent in appearance compared to prior study. 3. No acute abdominopelvic findings. 4. Bilateral punctate nonobstructing renal calculi. 5. Sequela of chronic pancreatitis. 6. Aortic atherosclerosis.  (ICD10-I70.0). Electronically Signed   By: Davina Poke D.O.   On: 08/30/2019 11:33    EKG pending  Assessment/Plan Principal Problem:   FTT (failure to thrive) in adult Suspect due left-sided lung cancer with local versus systemic metastases. Placed in observation.  IV fluids.  Patient seen by palliative NP (borders) and offered hospice to the family.  Daughter [name]) who is the [place] was undecided and wanted to discuss with her family. Patient was not a  candidate for systemic chemotherapy and was referred to palliative radiation. Will consult palliative care for further discussion as patient's condition continues to worsen. Continue oxycodone every 6 hours as needed for pain.  Active Problems:   Type 2 diabetes mellitus with other diabetic kidney complication [facility]) Patient hyperglycemic in the ED.  Placed on D5 normal saline.  Hold off on home insulin dose and monitor on sliding scale coverage.  Accelerated hypertension/hypertensive crisis Improved after receiving clonidine in the ED.  Resume amlodipine and Cardura.  Will place on as needed IV hydralazine.  Acute kidney injury on chronic kidney disease stage IIIb Possibly due to dehydration.  Hold irbesartan     Bronchogenic lung cancer, left (HCC)   Pleural effusion on left No respiratory symptoms and does not need thoracentesis at present.  Palliative care consult with possible discussion with family on hospice care.  Total time spent: 50 minutes  DVT prophylaxis: Subcu Lovenox Code Status:   DNR Family Communication:  Left voice message to the daughter Disposition Plan:   Patient is from:  Home  Anticipated DC to:  Home  Anticipated DC date:  6/15  Anticipated DC barriers: Generalized weakness, accelerated hypertension  Consults called:  Palliative Admission status:  Observation  Severity of Illness: The appropriate patient status for this patient is OBSERVATION. Observation status is judged to be reasonable and necessary in order to provide the required intensity of service to ensure the patient's safety. The patient's presenting symptoms, physical exam findings, and initial radiographic and laboratory data in the context of their medical condition is felt to place them at decreased risk for further clinical deterioration. Furthermore, it is anticipated that the patient will be medically stable for discharge from the hospital within 2 midnights of admission. The following  factors support the patient status of observation.   " The patient's presenting symptoms include generalized weakness " The physical exam findings include accelerated hypertension " The initial radiographic and laboratory data are left-sided pleural effusion      Davi Rotan MD [facility]  How to contact the [facility] Attending or Consulting provider [place] or covering provider during after hours [place], for this patient?   1. Check the care team in [facility] and look for a) attending/consulting [facility] provider listed and b) the [facility] team listed 2. Log into [email or web address] and use [place]'s universal password to access. If you do not have the password, please contact the hospital operator.  3. Locate the [facility] provider you are looking for under [facility] and page to a number that you can be directly reached. 4. If you still have difficulty reaching the provider, please page the [facility] (Director on Call) for the Hospitalists listed on amion for assistance.  08/30/2019, 1:59 PM

## 2019-08-30 NOTE — ED Notes (Signed)
MD at bedside. 

## 2019-08-30 NOTE — ED Notes (Signed)
[place] as she had previously requested this RN notify her once pt got room assignment. Voicemail full so could not leave a message to call this RN back.

## 2019-08-31 ENCOUNTER — Ambulatory Visit: Payer: Medicare Other

## 2019-08-31 DIAGNOSIS — N390 Urinary tract infection, site not specified: Secondary | ICD-10-CM | POA: Diagnosis present

## 2019-08-31 DIAGNOSIS — E162 Hypoglycemia, unspecified: Secondary | ICD-10-CM | POA: Diagnosis not present

## 2019-08-31 DIAGNOSIS — E1051 Type 1 diabetes mellitus with diabetic peripheral angiopathy without gangrene: Secondary | ICD-10-CM | POA: Diagnosis present

## 2019-08-31 DIAGNOSIS — N179 Acute kidney failure, unspecified: Secondary | ICD-10-CM | POA: Diagnosis present

## 2019-08-31 DIAGNOSIS — R531 Weakness: Secondary | ICD-10-CM | POA: Diagnosis not present

## 2019-08-31 DIAGNOSIS — C3492 Malignant neoplasm of unspecified part of left bronchus or lung: Secondary | ICD-10-CM | POA: Diagnosis not present

## 2019-08-31 DIAGNOSIS — I169 Hypertensive crisis, unspecified: Secondary | ICD-10-CM | POA: Diagnosis not present

## 2019-08-31 DIAGNOSIS — R64 Cachexia: Secondary | ICD-10-CM | POA: Diagnosis present

## 2019-08-31 DIAGNOSIS — Z961 Presence of intraocular lens: Secondary | ICD-10-CM | POA: Diagnosis present

## 2019-08-31 DIAGNOSIS — R9431 Abnormal electrocardiogram [ECG] [EKG]: Secondary | ICD-10-CM | POA: Diagnosis present

## 2019-08-31 DIAGNOSIS — Z743 Need for continuous supervision: Secondary | ICD-10-CM | POA: Diagnosis not present

## 2019-08-31 DIAGNOSIS — Z20822 Contact with and (suspected) exposure to covid-19: Secondary | ICD-10-CM | POA: Diagnosis present

## 2019-08-31 DIAGNOSIS — Z681 Body mass index (BMI) 19 or less, adult: Secondary | ICD-10-CM | POA: Diagnosis not present

## 2019-08-31 DIAGNOSIS — Z7189 Other specified counseling: Secondary | ICD-10-CM | POA: Diagnosis not present

## 2019-08-31 DIAGNOSIS — R5381 Other malaise: Secondary | ICD-10-CM | POA: Diagnosis not present

## 2019-08-31 DIAGNOSIS — I129 Hypertensive chronic kidney disease with stage 1 through stage 4 chronic kidney disease, or unspecified chronic kidney disease: Secondary | ICD-10-CM | POA: Diagnosis present

## 2019-08-31 DIAGNOSIS — J90 Pleural effusion, not elsewhere classified: Secondary | ICD-10-CM | POA: Diagnosis not present

## 2019-08-31 DIAGNOSIS — E10649 Type 1 diabetes mellitus with hypoglycemia without coma: Secondary | ICD-10-CM | POA: Diagnosis present

## 2019-08-31 DIAGNOSIS — R627 Adult failure to thrive: Secondary | ICD-10-CM | POA: Diagnosis not present

## 2019-08-31 DIAGNOSIS — F0390 Unspecified dementia without behavioral disturbance: Secondary | ICD-10-CM | POA: Diagnosis present

## 2019-08-31 DIAGNOSIS — E860 Dehydration: Secondary | ICD-10-CM | POA: Diagnosis present

## 2019-08-31 DIAGNOSIS — E1022 Type 1 diabetes mellitus with diabetic chronic kidney disease: Secondary | ICD-10-CM | POA: Diagnosis present

## 2019-08-31 DIAGNOSIS — E785 Hyperlipidemia, unspecified: Secondary | ICD-10-CM | POA: Diagnosis present

## 2019-08-31 DIAGNOSIS — E039 Hypothyroidism, unspecified: Secondary | ICD-10-CM | POA: Diagnosis present

## 2019-08-31 DIAGNOSIS — Z515 Encounter for palliative care: Secondary | ICD-10-CM | POA: Diagnosis not present

## 2019-08-31 DIAGNOSIS — M109 Gout, unspecified: Secondary | ICD-10-CM | POA: Diagnosis present

## 2019-08-31 DIAGNOSIS — Z7401 Bed confinement status: Secondary | ICD-10-CM | POA: Diagnosis not present

## 2019-08-31 DIAGNOSIS — Z66 Do not resuscitate: Secondary | ICD-10-CM | POA: Diagnosis not present

## 2019-08-31 DIAGNOSIS — K861 Other chronic pancreatitis: Secondary | ICD-10-CM | POA: Diagnosis present

## 2019-08-31 DIAGNOSIS — N1832 Chronic kidney disease, stage 3b: Secondary | ICD-10-CM | POA: Diagnosis present

## 2019-08-31 DIAGNOSIS — M2550 Pain in unspecified joint: Secondary | ICD-10-CM | POA: Diagnosis not present

## 2019-08-31 LAB — GLUCOSE, CAPILLARY
Glucose-Capillary: 225 mg/dL — ABNORMAL HIGH (ref 70–99)
Glucose-Capillary: 214 mg/dL — ABNORMAL HIGH (ref 70–99)
Glucose-Capillary: 52 mg/dL — ABNORMAL LOW (ref 70–99)
Glucose-Capillary: 60 mg/dL — ABNORMAL LOW (ref 70–99)
Glucose-Capillary: 76 mg/dL (ref 70–99)
Glucose-Capillary: 111 mg/dL — ABNORMAL HIGH (ref 70–99)
Glucose-Capillary: 105 mg/dL — ABNORMAL HIGH (ref 70–99)

## 2019-08-31 LAB — BASIC METABOLIC PANEL
Anion gap: 10 (ref 5–15)
BUN: 13 mg/dL (ref 8–23)
CO2: 23 mmol/L (ref 22–32)
Calcium: 8.1 mg/dL — ABNORMAL LOW (ref 8.9–10.3)
Chloride: 112 mmol/L — ABNORMAL HIGH (ref 98–111)
Creatinine, Ser: 1.36 mg/dL — ABNORMAL HIGH (ref 0.44–1.00)
GFR calc Af Amer: 43 mL/min — ABNORMAL LOW (ref >60)
GFR calc non Af Amer: 37 mL/min — ABNORMAL LOW (ref >60)
Glucose, Bld: 249 mg/dL — ABNORMAL HIGH (ref 70–99)
Potassium: 3.8 mmol/L (ref 3.5–5.1)
Sodium: 145 mmol/L (ref 135–145)

## 2019-08-31 MED ORDER — SODIUM CHLORIDE 0.9 % IV SOLN: INTRAVENOUS | Status: AC

## 2019-08-31 MED ORDER — INSULIN GLARGINE 100 UNIT/ML ~~LOC~~ SOLN
4.0000 [IU] | Freq: Every day | SUBCUTANEOUS | Status: DC
  Administered 2019-09-01 – 2019-09-03 (×3): 4 [IU] via SUBCUTANEOUS
  Filled 2019-08-31 (×5): qty 0.04

## 2019-08-31 NOTE — Plan of Care (Signed)

## 2019-08-31 NOTE — Progress Notes (Signed)
[facility]ST consulted to obtain IV access. Upon arriving at pt's bedside and introducing self and procedure, pt responded, "I am tired of all this. I just want to leave this place". I asked patient if she would allow me to look at her right arm and she obliged. Assessment of right arm revealed small fragile vessels. Attempted to place 24g IV twice, unsuccessfully. Pt verbalized she does not want to be stuck anymore. Notified pt's nurse, Princess of findings. Advised if patient will allow a second IVT member to assess her arms after 1900, to place an IVT consult.

## 2019-08-31 NOTE — Progress Notes (Signed)
PROGRESS NOTE    Caitlyn Marshall  XQJ:[id number] DOB: September 02, 1940 DOA: 08/30/2019 PCP: Birdie Sons, MD    Chief Complaint  Patient presents with  . Back Pain    Brief Narrative:  79 year old female with chronic kidney disease stage IIIb, type 1 diabetes mellitus on insulin, possible dementia, hypertension, left lung mass with pleural effusion diagnosed in March 2021 with PET scan suggestive of bronchogenic carcinoma stage IIIb.  With her comorbidities and frailty she was not a candidate for biopsy and neither for chemotherapy.  She was referred to palliative radiation and was also recently seen by palliative oncology and recommended hospice which family was not yet convinced of. Patient presented to the ED with abdominal and low back pain, frequent urination and nausea with weakness.  Found to have accelerated hypertension in the ED with acute on chronic CKD, hypokalemia and hypoglycemia. Admitted for further management.    Assessment & Plan:   Principal Problem:   FTT (failure to thrive) in adult Secondary to underlying lung malignancy, poor nutrition and overall deconditioning. Continue gentle hydration.   Palliative care consulted and will discuss with family regarding goals of care discussion and on pursuing hospice.  She is not a candidate for systemic chemotherapy and was referred to palliative radiation.  Continue oxycodone every 6 hours as needed for pain.  PT evaluation.  Active Problems:   Type 1 diabetes mellitus with other diabetic kidney complication (HCC) Patient hyperglycemic in the ED and placed on D5 drip.  Episode of hypoglycemia overnight and required D50. CBG elevated this morning and I have started her on low-dose Lantus with sliding scale coverage.  Accelerated hypertension/hypertensive crisis Improved after receiving clonidine in the ED.  Resume home meds and added as needed hydralazine.  Blood pressure currently stable.  Acute kidney injury on  chronic kidney disease stage IIIb Secondary to dehydration.  Improved in a.m. lab with IV fluids.  Continue to hold irbesartan.     Bronchogenic lung cancer, left (HCC)   Pleural effusion on left No respiratory symptoms and does not need thoracentesis at present.  Palliative care on board.  Plan to discuss with family regarding pursuing hospice.   DVT prophylaxis: Subcu Lovenox Code Status: DNR Family Communication: Carb modified Disposition:   Status is: Observation  The patient will require care spanning > 2 midnights and should be moved to inpatient because: IV treatments appropriate due to intensity of illness or inability to take PO.  Significant failure to thrive with poor p.o. intake, episodes of hypoglycemia and type 1 diabetes.  Needs inpatient goals of care discussion for proper disposition.  Dispo: The patient is from: Home              Anticipated d/c is to: To be determined              Anticipated d/c date is: 1 day              Patient currently is not medically stable to d/c.       Consultants:   Palliative care   Procedures: CT abdomen   Antimicrobials: None   Subjective: Seen and examined.  Remains confused.  CBG elevated this morning.  Objective: Vitals:   08/30/19 2346 08/31/19 0403 08/31/19 0817 08/31/19 1154  BP: (!) 124/56 (!) 161/46 (!) 139/55 (!) 156/50  Pulse: 79 73 69 84  Resp: 16 16 14 14   Temp: 98.4 F (36.9 C) 98 F (36.7 C) 98.8 F (37.1 C) 98.4 F (36.9  C)  TempSrc: Oral Oral    SpO2: 95% 97% 98% 98%  Weight:      Height:        Intake/Output Summary (Last 24 hours) at 08/31/2019 1217 Last data filed at 08/31/2019 0945 Gross per 24 hour  Intake 1540.88 ml  Output --  Net 1540.88 ml   Filed Weights   08/30/19 0934  Weight: 40.5 kg   Physical exam Elderly female, appears frail, not in distress HEENT: Temporal wasting, pallor present, moist mucosa Chest: Diminished breath sounds over left lung CVs: Normal S1-S2 GI:  Soft, nondistended nontender Musculoskeletal: Warm, no edema CNS: AAO x0   Data Reviewed: I have personally reviewed following labs and imaging studies  CBC: Recent Labs  Lab 08/30/19 0939  WBC 12.7*  HGB 12.8  HCT 38.4  MCV 79.7*  PLT 322    Basic Metabolic Panel: Recent Labs  Lab 08/30/19 0939 08/31/19 0809  NA 146* 145  K 3.2* 3.8  CL 108 112*  CO2 27 23  GLUCOSE 49* 249*  BUN 17 13  CREATININE 1.84* 1.36*  CALCIUM 8.8* 8.1*    GFR: Estimated Creatinine Clearance: 22.1 mL/min (A) (by C-G formula based on SCr of 1.36 mg/dL (H)).  Liver Function Tests: No results for input(s): AST, ALT, ALKPHOS, BILITOT, PROT, ALBUMIN in the last 168 hours.  CBG: Recent Labs  Lab 08/30/19 1741 08/30/19 1820 08/30/19 2132 08/31/19 0817 08/31/19 1154  GLUCAP 48* 214* 267* 225* 214*     Recent Results (from the past 240 hour(s))  SARS Coronavirus 2 by RT PCR (hospital order, performed in [facility] hospital lab) Nasopharyngeal Nasopharyngeal Swab     Status: None   Collection Time: 08/30/19  2:55 PM   Specimen: Nasopharyngeal Swab  Result Value Ref Range Status   SARS Coronavirus 2 NEGATIVE NEGATIVE Final    Comment: (NOTE) SARS-CoV-2 target nucleic acids are NOT DETECTED.  The SARS-CoV-2 RNA is generally detectable in upper and lower respiratory specimens during the acute phase of infection. The lowest concentration of SARS-CoV-2 viral copies this assay can detect is 250 copies / mL. A negative result does not preclude SARS-CoV-2 infection and should not be used as the sole basis for treatment or other patient management decisions.  A negative result may occur with improper specimen collection / handling, submission of specimen other than nasopharyngeal swab, presence of viral mutation(s) within the areas targeted by this assay, and inadequate number of viral copies (<250 copies / mL). A negative result must be combined with clinical observations, patient history,  and epidemiological information.  Fact Sheet for Patients:   StrictlyIdeas.no  Fact Sheet for Healthcare Providers: BankingDealers.co.za  This test is not yet approved or  cleared by the [place] FDA and has been authorized for detection and/or diagnosis of SARS-CoV-2 by FDA under an Emergency Use Authorization (EUA).  This EUA will remain in effect (meaning this test can be used) for the duration of the COVID-19 declaration under Section 564(b)(1) of the Act, 21 U.S.C. section 360bbb-3(b)(1), unless the authorization is terminated or revoked sooner.  Performed at [facility], [address], [place], [place] [address]          Radiology Studies: CT ABDOMEN PELVIS WO CONTRAST  Result Date: 08/30/2019 CLINICAL DATA:  Back pain, abdominal pain.  History of lung cancer EXAM: CT ABDOMEN AND PELVIS WITHOUT CONTRAST TECHNIQUE: Multidetector CT imaging of the abdomen and pelvis was performed following the standard protocol without IV contrast. COMPARISON:  CT  06/15/2019 FINDINGS: Lower chest: Moderate sized left-sided pleural effusion with associated atelectasis. Partially visualized masslike consolidation within the lingula. Nodularity along the left heart border may represent pericardial lymph nodes or areas of pleural metastatic disease (series 2, images 8-10). This is more prominent in appearance compared to prior. Hepatobiliary: Stable small hypoattenuating hepatic lesions. No new focal hepatic mass. Gallbladder appears unremarkable. No hyperdense gallstone or biliary dilatation. Pancreas: Pancreatic parenchymal atrophy with numerous coarse calcifications reflecting sequela of chronic pancreatitis. No peripancreatic inflammatory changes or fluid. Spleen: Unremarkable. Adrenals/Urinary Tract: No adrenal mass identified. Bilateral punctate nonobstructing renal calculi. No hydronephrosis. Urinary bladder appears unremarkable.  Stomach/Bowel: No evidence of bowel obstruction or acute bowel inflammation, although evaluation is limited by patient's cachexia and lack of intra-abdominal fat. Appendix not definitively visualized. Vascular/Lymphatic: Marked aortoiliac atherosclerotic calcification without evidence of aneurysm. No definite abdominopelvic lymphadenopathy within the limitations of this exam. Reproductive: Grossly unremarkable. Other: Unchanged soft tissue prominence along the lower anterior abdominal wall, nonspecific, but may be injection related. No ascites or intra-abdominal fluid collection. No free intraperitoneal air. Musculoskeletal: No acute or significant osseous findings. No suspicious osseous lesion IMPRESSION: 1. Moderate sized left-sided pleural effusion with associated atelectasis, slightly increased in size from prior. Partially visualized mass-like consolidation within the lingula. 2. Nodularity along the left heart border may represent pericardial lymph nodes or areas of pleural metastatic disease. This is more prominent in appearance compared to prior study. 3. No acute abdominopelvic findings. 4. Bilateral punctate nonobstructing renal calculi. 5. Sequela of chronic pancreatitis. 6. Aortic atherosclerosis.  (ICD10-I70.0). Electronically Signed   By: Davina Poke D.O.   On: 08/30/2019 11:33        Scheduled Meds: . allopurinol  100 mg Oral Daily  . amLODipine  10 mg Oral Daily  . ascorbic acid  500 mg Oral Daily  . aspirin EC  81 mg Oral Daily  . donepezil  10 mg Oral QHS  . doxazosin  4 mg Oral QHS  . enoxaparin (LOVENOX) injection  30 mg Subcutaneous Q24H  . insulin aspart  0-9 Units Subcutaneous TID WC  . insulin glargine  4 Units Subcutaneous Daily  . levothyroxine  50 mcg Oral Daily  . linaclotide  145 mcg Oral QAC breakfast  . mouth rinse  15 mL Mouth Rinse BID  . megestrol  20 mg Oral Daily  . mirtazapine  15 mg Oral QHS  . montelukast  10 mg Oral Daily  . pravastatin  20 mg Oral  q1800   Continuous Infusions: . dextrose 5 % and 0.9% NaCl 100 mL/hr at 08/31/19 0500     LOS: 0 days    Time spent: 25 minutes    Linford Quintela, MD [facility]   To contact the attending provider between 7A-7P or the covering provider during after hours 7P-7A, please log into the web site [email or web address] and access using universal [place] password for that web site. If you do not have the password, please call the hospital operator.  08/31/2019, 12:17 PM

## 2019-08-31 NOTE — Progress Notes (Signed)
  0800- No IV on assessment patient had pulled it out. Writer attempted access x3 times, sent order to IV team who were also not successful..will follow up with another IV nurse, MD Notified that patient does not have an ID fluids on hold at this time.  1530- Sister [name] and another relative at bedside.Glass blower/designer notified MD they wanted to speak with him.  1650-FM not present, no other issues or concerns at this time, pt has no complaints of pain, discomfort or respiratory distress.

## 2019-08-31 NOTE — Progress Notes (Signed)
Inpatient Diabetes Program Recommendations  AACE/ADA: New Consensus Statement on Inpatient Glycemic Control   Target Ranges:  Prepandial:   less than 140 mg/dL      Peak postprandial:   less than 180 mg/dL (1-2 hours)      Critically ill patients:  140 - 180 mg/dL   Results for Caitlyn Marshall, Caitlyn Marshall (MRN [id number]) as of 08/31/2019 07:28  Ref. Range 08/30/2019 17:27 08/30/2019 17:41 08/30/2019 18:20 08/30/2019 21:32  Glucose-Capillary Latest Ref Range: 70 - 99 mg/dL 51 (L) 48 (L) 214 (H) 267 (H)   Review of Glycemic Control  Diabetes history: DM1 (per office note by Dr. Gabriel Carina on 06/02/19) Outpatient Diabetes medications: NPH 6 units in AM, NPH 7 units in PM, Regular 6 units with breakfast, 8 units with lunch, and 10 units with supper Current orders for Inpatient glycemic control: Novolog 0-9 units TID with meals; D5NS @ 100 ml/hr  Inpatient Diabetes Program Recommendations:    Labs: Please consider ordering stat BMET to ensure patient is not in DKA.   Insulin-If patient is not in DKA, please consider ordering Levemir 4 units Q24H and changing CBGs to Q4H and Novolog to 0-6 units Q4H.  NOTE: Patient initially hypoglycemic on 08/30/19 when she presented to hospital. No insulin given since admitted. Noted glucose 267 mg/dl at 21:32 last night.  Per chart, patient sees Dr. Gabriel Carina (Endocrinologist) and was last seen by Dr. Gabriel Carina on 06/02/19 and Dr. Gabriel Carina has diabetes listed as Type 1 DM.  Would recommend checking stat BMET to ensure patient is not in DKA. If in DKA, would recommend IV insulin. IF not in DKA, recommend low dose Levemir and Novolog correction Q4H.  Thanks, Barnie Alderman, RN, MSN, CDE Diabetes Coordinator Inpatient Diabetes Program [phone] (Team Pager from 8am to 5pm)

## 2019-08-31 NOTE — Progress Notes (Signed)
Patient has a pending outpatient Palliative appointment with [facility] Palliative NP on 6/21. [place] notified.  Hospital Liaison will continue to follow disposition.  Flo Shanks BSN, RN, [place] [phone]

## 2019-08-31 NOTE — Progress Notes (Signed)
Palliative:  Stopped by patient's room multiple times - no family present and patient unable to participate in [place] conversation independently. Left voicemail with daughter who is HCPOA - will await return call. Will attempt to speak with daughter again tomorrow if I do not hear from her today.  Juel Burrow, DNP, AGNP-C Palliative Medicine Team Team Phone # [phone]  Pager # [phone]  NO CHARGE

## 2019-09-01 ENCOUNTER — Ambulatory Visit: Payer: Medicare Other

## 2019-09-01 DIAGNOSIS — J90 Pleural effusion, not elsewhere classified: Secondary | ICD-10-CM

## 2019-09-01 DIAGNOSIS — Z515 Encounter for palliative care: Secondary | ICD-10-CM

## 2019-09-01 DIAGNOSIS — Z66 Do not resuscitate: Secondary | ICD-10-CM

## 2019-09-01 DIAGNOSIS — E1029 Type 1 diabetes mellitus with other diabetic kidney complication: Secondary | ICD-10-CM

## 2019-09-01 DIAGNOSIS — Z7189 Other specified counseling: Secondary | ICD-10-CM

## 2019-09-01 LAB — GLUCOSE, CAPILLARY
Glucose-Capillary: 190 mg/dL — ABNORMAL HIGH (ref 70–99)
Glucose-Capillary: 224 mg/dL — ABNORMAL HIGH (ref 70–99)
Glucose-Capillary: 191 mg/dL — ABNORMAL HIGH (ref 70–99)

## 2019-09-01 MED ORDER — SODIUM CHLORIDE 0.9 % IV SOLN: INTRAVENOUS | Status: DC

## 2019-09-01 NOTE — Progress Notes (Signed)
[name], female   DOB: 1940/10/23, 79 y.o.   MRN: [id number]  PROGRESS NOTE    [name]  LFY:[id number] DOB: 03-25-1940 DOA: 08/30/2019 PCP: Birdie Sons, MD   Brief Narrative:  79 year old female with chronic kidney disease stage IIIb, type 1 diabetes mellitus on insulin, possible dementia, hypertension, left lung mass with pleural effusion diagnosed in March 2021 with PET scan suggestive of bronchogenic carcinoma stage IIIb for which she was not a candidate for biopsy or chemotherapy because of her comorbidities and frailty; referred to palliative radiation and was seen by palliative/oncology recently and recommended hospice which family was not yet convinced of.  She presented to the ED with abdominal and low back pain, frequent urination along with nausea and weakness.  Found to have accelerated hypertension in the ED with acute on chronic CKD, hypokalemia and hyperglycemia.  Admitted for further management.  Assessment & Plan:  FTT (failure to thrive) in adult Secondary to underlying lung malignancy, poor nutrition and overall deconditioning. Continue gentle hydration.  Palliative care consulted and will discuss with family regarding goals of care discussion and on pursuing hospice.  She is not a candidate for systemic chemotherapy and was referred to palliative radiation. -Palliative/oncology had recently seen her and had recommended hospice which family was not yet convinced of. -Continue oxycodone every 6 hours as needed for pain.  PT evaluation.  Type 1diabetes mellitus with other diabetic kidney complication (HCC) with hyperglycemia and hypoglycemia -Currently has episodes of hypoglycemia.  Continue CBGs with SSI.  Accelerated hypertension/hypertensive crisis -Improved after receiving clonidine in the ED.   continue amlodipine and added as needed hydralazine.  Blood pressure currently stable.  Acute kidney injury on chronic kidney disease  stage IIIb -Secondary to dehydration.  Improved with IV fluids. Continue to hold irbesartan.   Bronchogenic lung cancer, left (HCC) Pleural effusion on left -No respiratory symptoms and does not need thoracentesis at present. Palliative care on board.  Plan to discuss with family regarding pursuing hospice.   DVT prophylaxis: Subcutaneous Lovenox Code Status: DNR Family Communication: None at bedside Disposition Plan: Status is: Inpatient  Remains inpatient appropriate because:IV treatments appropriate due to intensity of illness or inability to take PO.  Would be appropriate for home/residential hospice if family agreeable   Dispo: The patient is from: Home              Anticipated d/c is to: Home              Anticipated d/c date is: 1 day              Patient currently is not medically stable to d/c.   Consultants: Palliative care  Procedures: None Antimicrobials: None   Subjective: Patient seen and examined at bedside.  Awake but a very poor historian, slightly confused, very slow to respond.  No overnight fever, vomiting reported.  Oral intake is still very poor.  Objective: Vitals:   08/31/19 1154 08/31/19 1650 08/31/19 2005 08/31/19 2336  BP: (!) 156/50 (!) 163/62 (!) 152/60 (!) 135/50  Pulse: 84 88 82 97  Resp: 14 16 16 18   Temp: 98.4 F (36.9 C) 98.6 F (37 C) 98.3 F (36.8 C) 97.8 F (36.6 C)  TempSrc:  Oral Oral Oral  SpO2: 98%  98% 99%  Weight:      Height:        Intake/Output Summary (Last 24 hours) at 09/01/2019 1056 Last data filed at 09/01/2019 0939 Gross per 24  hour  Intake 120 ml  Output --  Net 120 ml   Filed Weights   08/30/19 0934  Weight: 40.5 kg    Examination:  General exam: Appears calm and comfortable.  Awake, slightly confused, very slow to respond to questions.  Looks cachectic. Respiratory system: Bilateral decreased breath sounds at bases with some scattered crackles Cardiovascular system: S1 & S2 heard, Rate  controlled Gastrointestinal system: Abdomen is nondistended, soft and nontender. Normal bowel sounds heard. Extremities: No cyanosis, clubbing, edema  Central nervous system: Chronic, slightly confused.  No focal neurological deficits. Moving extremities Skin: No rashes, lesions or ulcers Psychiatry: Flat affect.     Data Reviewed: I have personally reviewed following labs and imaging studies  CBC: Recent Labs  Lab 08/30/19 0939  WBC 12.7*  HGB 12.8  HCT 38.4  MCV 79.7*  PLT 144   Basic Metabolic Panel: Recent Labs  Lab 08/30/19 0939 08/31/19 0809  NA 146* 145  K 3.2* 3.8  CL 108 112*  CO2 27 23  GLUCOSE 49* 249*  BUN 17 13  CREATININE 1.84* 1.36*  CALCIUM 8.8* 8.1*   GFR: Estimated Creatinine Clearance: 22.1 mL/min (A) (by C-G formula based on SCr of 1.36 mg/dL (H)). Liver Function Tests: No results for input(s): AST, ALT, ALKPHOS, BILITOT, PROT, ALBUMIN in the last 168 hours. No results for input(s): LIPASE, AMYLASE in the last 168 hours. No results for input(s): AMMONIA in the last 168 hours. Coagulation Profile: No results for input(s): INR, PROTIME in the last 168 hours. Cardiac Enzymes: No results for input(s): CKTOTAL, CKMB, CKMBINDEX, TROPONINI in the last 168 hours. BNP (last 3 results) No results for input(s): PROBNP in the last 8760 hours. HbA1C: No results for input(s): HGBA1C in the last 72 hours. CBG: Recent Labs  Lab 08/31/19 1713 08/31/19 1730 08/31/19 2003 08/31/19 2128 09/01/19 0803  GLUCAP 60* 76 111* 105* 190*   Lipid Profile: No results for input(s): CHOL, HDL, LDLCALC, TRIG, CHOLHDL, LDLDIRECT in the last 72 hours. Thyroid Function Tests: No results for input(s): TSH, T4TOTAL, FREET4, T3FREE, THYROIDAB in the last 72 hours. Anemia Panel: No results for input(s): VITAMINB12, FOLATE, FERRITIN, TIBC, IRON, RETICCTPCT in the last 72 hours. Sepsis Labs: No results for input(s): PROCALCITON, LATICACIDVEN in the last 168  hours.  Recent Results (from the past 240 hour(s))  SARS Coronavirus 2 by RT PCR (hospital order, performed in [facility] hospital lab) Nasopharyngeal Nasopharyngeal Swab     Status: None   Collection Time: 08/30/19  2:55 PM   Specimen: Nasopharyngeal Swab  Result Value Ref Range Status   SARS Coronavirus 2 NEGATIVE NEGATIVE Final    Comment: (NOTE) SARS-CoV-2 target nucleic acids are NOT DETECTED.  The SARS-CoV-2 RNA is generally detectable in upper and lower respiratory specimens during the acute phase of infection. The lowest concentration of SARS-CoV-2 viral copies this assay can detect is 250 copies / mL. A negative result does not preclude SARS-CoV-2 infection and should not be used as the sole basis for treatment or other patient management decisions.  A negative result may occur with improper specimen collection / handling, submission of specimen other than nasopharyngeal swab, presence of viral mutation(s) within the areas targeted by this assay, and inadequate number of viral copies (<250 copies / mL). A negative result must be combined with clinical observations, patient history, and epidemiological information.  Fact Sheet for Patients:   StrictlyIdeas.no  Fact Sheet for Healthcare Providers: BankingDealers.co.za  This test is not yet approved or  cleared by the [place] and has been authorized for detection and/or diagnosis of SARS-CoV-2 by FDA under an Emergency Use Authorization (EUA).  This EUA will remain in effect (meaning this test can be used) for the duration of the COVID-19 declaration under Section 564(b)(1) of the Act, 21 U.S.C. section 360bbb-3(b)(1), unless the authorization is terminated or revoked sooner.  Performed at [facility], [address] [place], [place] [address]          Radiology Studies: CT ABDOMEN PELVIS WO CONTRAST  Result Date: 08/30/2019 CLINICAL DATA:   Back pain, abdominal pain.  History of lung cancer EXAM: CT ABDOMEN AND PELVIS WITHOUT CONTRAST TECHNIQUE: Multidetector CT imaging of the abdomen and pelvis was performed following the standard protocol without IV contrast. COMPARISON:  CT 06/15/2019 FINDINGS: Lower chest: Moderate sized left-sided pleural effusion with associated atelectasis. Partially visualized masslike consolidation within the lingula. Nodularity along the left heart border may represent pericardial lymph nodes or areas of pleural metastatic disease (series 2, images 8-10). This is more prominent in appearance compared to prior. Hepatobiliary: Stable small hypoattenuating hepatic lesions. No new focal hepatic mass. Gallbladder appears unremarkable. No hyperdense gallstone or biliary dilatation. Pancreas: Pancreatic parenchymal atrophy with numerous coarse calcifications reflecting sequela of chronic pancreatitis. No peripancreatic inflammatory changes or fluid. Spleen: Unremarkable. Adrenals/Urinary Tract: No adrenal mass identified. Bilateral punctate nonobstructing renal calculi. No hydronephrosis. Urinary bladder appears unremarkable. Stomach/Bowel: No evidence of bowel obstruction or acute bowel inflammation, although evaluation is limited by patient's cachexia and lack of intra-abdominal fat. Appendix not definitively visualized. Vascular/Lymphatic: Marked aortoiliac atherosclerotic calcification without evidence of aneurysm. No definite abdominopelvic lymphadenopathy within the limitations of this exam. Reproductive: Grossly unremarkable. Other: Unchanged soft tissue prominence along the lower anterior abdominal wall, nonspecific, but may be injection related. No ascites or intra-abdominal fluid collection. No free intraperitoneal air. Musculoskeletal: No acute or significant osseous findings. No suspicious osseous lesion IMPRESSION: 1. Moderate sized left-sided pleural effusion with associated atelectasis, slightly increased in size from  prior. Partially visualized mass-like consolidation within the lingula. 2. Nodularity along the left heart border may represent pericardial lymph nodes or areas of pleural metastatic disease. This is more prominent in appearance compared to prior study. 3. No acute abdominopelvic findings. 4. Bilateral punctate nonobstructing renal calculi. 5. Sequela of chronic pancreatitis. 6. Aortic atherosclerosis.  (ICD10-I70.0). Electronically Signed   By: Davina Poke D.O.   On: 08/30/2019 11:33        Scheduled Meds: . allopurinol  100 mg Oral Daily  . amLODipine  10 mg Oral Daily  . ascorbic acid  500 mg Oral Daily  . aspirin EC  81 mg Oral Daily  . donepezil  10 mg Oral QHS  . doxazosin  4 mg Oral QHS  . enoxaparin (LOVENOX) injection  30 mg Subcutaneous Q24H  . insulin aspart  0-9 Units Subcutaneous TID WC  . insulin glargine  4 Units Subcutaneous Daily  . levothyroxine  50 mcg Oral Daily  . linaclotide  145 mcg Oral QAC breakfast  . mouth rinse  15 mL Mouth Rinse BID  . megestrol  20 mg Oral Daily  . mirtazapine  15 mg Oral QHS  . montelukast  10 mg Oral Daily  . pravastatin  20 mg Oral q1800   Continuous Infusions: . sodium chloride Stopped (08/31/19 1456)          Aline August, MD [facility] 09/01/2019, 10:56 AM

## 2019-09-01 NOTE — Progress Notes (Addendum)
Inpatient Diabetes Program Recommendations  AACE/ADA: New Consensus Statement on Inpatient Glycemic Control   Target Ranges:  Prepandial:   less than 140 mg/dL      Peak postprandial:   less than 180 mg/dL (1-2 hours)      Critically ill patients:  140 - 180 mg/dL   Results for Caitlyn Marshall, Caitlyn Marshall (MRN [id number]) as of 09/01/2019 09:31  Ref. Range 08/31/2019 08:17 08/31/2019 11:54 08/31/2019 16:52 08/31/2019 17:13 08/31/2019 17:30 08/31/2019 20:03 08/31/2019 21:28 09/01/2019 08:03  Glucose-Capillary Latest Ref Range: 70 - 99 mg/dL 225 (H) 214 (H) 52 (L) 60 (L) 76 111 (H) 105 (H) 190 (H)   Review of Glycemic Control  Diabetes history: DM1 (per office note by Dr. Gabriel Carina on 06/02/19) Outpatient Diabetes medications: NPH 6 units in AM, NPH 7 units in PM, Regular 6 units with breakfast, 8 units with lunch, and 10 units with supper Current orders for Inpatient glycemic control: Lantus 4 units daily, Novolog 0-9 units TID with meals  Inpatient Diabetes Program Recommendations:    Insulin-Basal: In reviewing chart, noted Lantus was NOT GIVEN on 08/31/19. Patient is scheduled to receive Lantus 4 units today at 10 am.  Insulin-Correction: Please consider decreasing Novolog correction to 0-6 units TID with meals.  Thanks, Barnie Alderman, RN, MSN, CDE Diabetes Coordinator Inpatient Diabetes Program [phone] (Team Pager from 8am to 5pm)

## 2019-09-01 NOTE — Consult Note (Signed)
Consultation Note Date: 09/01/2019   Patient Name: Caitlyn Marshall  DOB: 10/05/1940  MRN: [id number]  Age / Sex: 79 y.o., female  PCP: Birdie Sons, MD Referring Physician: Aline August, MD  Reason for Consultation: Establishing goals of care  HPI/Patient Profile: 79 y.o. female  with past medical history of presumed lung cancer (unable to tolerate biopsy), CKD 3, T1DM, dementia, HTN, and HLD admitted on 08/30/2019 with abdomen and low back pain. Found to have accelerated htn, aki, hyperkalemia, and hypoglycemia on admission. Patient diagnosed with FTT d/t malignancy. Patient has not been a candidate for chemotherapy but was going to receive palliative radiation; however, she was hospitalized prior to initiation. PMT consulted to assist with [facility].     Clinical Assessment and Goals of Care: I have reviewed medical records including EPIC notes, labs and imaging, received report from RN, assessed the patient and then met with patient, daughter Chauncey Reading) [name], and cousin  to discuss diagnosis prognosis, GOC, EOL wishes, disposition and options.  Patient is sleeping today - barely wakes to voice and physical stimulation but quickly goes back to sleep. Unable to participate in goals of care discussion.   I introduced Palliative Medicine as specialized medical care for people living with serious illness. It focuses on providing relief from the symptoms and stress of a serious illness. The goal is to improve quality of life for both the patient and the family.  Family shares about patient's decline - barely ambulatory, very weak, at least a 15 lb weight loss in the last month. Poor appetite. We discussed patient's current illness and what it means in the larger context of patient's on-going co-morbidities.  Natural disease trajectory and expectations at EOL were discussed.  We discuss that patient is likely too frail for any aggressive interventions and  they express understanding.   The difference between aggressive medical intervention and comfort care was considered in light of the patient's goals of care.   Hospice and Palliative Care services outpatient were explained and offered. Family is interested in hospice support - they are trying to decide between home hospice or hospice facility. We discuss differences of them. They ask for the evening to discuss with other family members.   We discuss symptom management. Currently patient appears comfortable and family does not have any concerns. They do share they are opposed to morphine and request that other opioids be given if needed.   They do request that current interventions be maintained for now, such as IV fluids, while she is hospitalized. We discuss that there will come a time when IV fluids will not be beneficial and possibly harmful - when she is in the dying process IV fluids may add to discomfort. Family expresses understanding to this.   Today patient is much less interactive than yesterday - unclear why - family feels she is just having a bad day and may perk up. We discuss this is possible but also possible this is a result of her illness and as her illness continues to progress we expect her to sleep more. They express understanding.   We planned to meet again tomorrow at 12 PM.  Questions and concerns were addressed. The family was encouraged to call with questions or concerns.   Primary Decision Maker HCPOA - daughter [name]    SUMMARY OF RECOMMENDATIONS   - family deciding between hospice at home and hospice facility - **please note family is opposed to morphine but would accept other opioids if needed for comfort** -  will f/u tomorrow at 12 - continue current measures - please continue IV fluids per family request - with no escalation  Code Status/Advance Care Planning:  DNR  Prognosis:   Poor prognosis r/t failure to thrive and malignancy  Discharge Planning:  To Be Determined      Primary Diagnoses: Present on Admission: . Hypertensive crisis . FTT (failure to thrive) in adult . 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 PVD (peripheral vascular disease) ([place]) . Dementia without behavioral disturbance ([place]) . Bronchogenic lung cancer, left ([place]) . Pleural effusion on left . Hypoglycemia   I have reviewed the medical record, interviewed the patient and family, and examined the patient. The following aspects are pertinent.  Past Medical History:  Diagnosis Date  . Chronic kidney disease   . Diabetes mellitus without complication ([place])   . Gout   . Hyperlipidemia   . Hypertension   . Hypothyroidism   . Thyroid disease    Social History   Socioeconomic History  . Marital status: Widowed    Spouse name: Not on file  . Number of children: 3  . Years of education: Not on file  . Highest education level: 12th grade  Occupational History  . Occupation: retired  Tobacco Use  . Smoking status: Former Smoker    Packs/day: 0.50    Years: 30.00    Pack years: 15.00    Types: Cigarettes    Quit date: 03/18/2007    Years since quitting: 12.4  . Smokeless tobacco: Never Used  . Tobacco comment: Quit smoking in 2008  Vaping Use  . Vaping Use: Never used  Substance and Sexual Activity  . Alcohol use: No    Alcohol/week: 0.0 standard drinks  . Drug use: No  . Sexual activity: Not on file  Other Topics Concern  . Not on file  Social History Narrative  . Not on file   Social Determinants of Health   Financial Resource Strain:   . Difficulty of Paying Living Expenses:   Food Insecurity:   . Worried About Charity fundraiser in the Last Year:   . Arboriculturist in the Last Year:   Transportation Needs:   . Film/video editor (Medical):   Marland Kitchen Lack of Transportation (Non-Medical):   Physical Activity: Inactive  . Days of Exercise per Week: 0 days  . Minutes of Exercise per Session: 0 min    Stress: No Stress Concern Present  . Feeling of Stress : Not at all  Social Connections: Unknown  . Frequency of Communication with Friends and Family: Patient refused  . Frequency of Social Gatherings with Friends and Family: Patient refused  . Attends Religious Services: Patient refused  . Active Member of Clubs or Organizations: Patient refused  . Attends Archivist Meetings: Patient refused  . Marital Status: Patient refused   Family History  Problem Relation Age of Onset  . Hypertension Mother   . Diabetes Mother        Diabetes mellitus Type 2  . Alzheimer's disease Mother   . Cancer Son        died at age 60, esophagus   Scheduled Meds: . allopurinol  100 mg Oral Daily  . amLODipine  10 mg Oral Daily  . ascorbic acid  500 mg Oral Daily  . aspirin EC  81 mg Oral Daily  . donepezil  10 mg Oral QHS  . doxazosin  4 mg Oral QHS  .  enoxaparin (LOVENOX) injection  30 mg Subcutaneous Q24H  . insulin aspart  0-9 Units Subcutaneous TID WC  . insulin glargine  4 Units Subcutaneous Daily  . levothyroxine  50 mcg Oral Daily  . linaclotide  145 mcg Oral QAC breakfast  . mouth rinse  15 mL Mouth Rinse BID  . megestrol  20 mg Oral Daily  . mirtazapine  15 mg Oral QHS  . montelukast  10 mg Oral Daily  . pravastatin  20 mg Oral q1800   Continuous Infusions: PRN Meds:.acetaminophen **OR** acetaminophen, albuterol, ALPRAZolam, hydrALAZINE, ondansetron **OR** ondansetron (ZOFRAN) IV, oxyCODONE No Known Allergies Review of Systems  Unable to perform ROS: Mental status change    Physical Exam Constitutional:      General: She is not in acute distress.    Comments: Temporal wasting lethargic  Pulmonary:     Effort: Pulmonary effort is normal. No respiratory distress.  Skin:    General: Skin is warm and dry.  Neurological:     Mental Status: She is disoriented.     Vital Signs: BP 128/68 (BP Location: Right Arm)   Pulse 96   Temp 99 F (37.2 C) (Oral)   Resp 15    Ht _0  (1.676 m)   Wt 40.5 kg   SpO2 95%   BMI 14.40 kg/m  Pain Scale: 0-10   Pain Score: 0-No pain   SpO2: SpO2: 95 % O2 Device:SpO2: 95 % O2 Flow Rate: .   IO: Intake/output summary:   Intake/Output Summary (Last 24 hours) at 09/01/2019 1415 Last data filed at 09/01/2019 0939 Gross per 24 hour  Intake 120 ml  Output --  Net 120 ml    LBM:   Baseline Weight: Weight: 40.5 kg Most recent weight: Weight: 40.5 kg     Palliative Assessment/Data: PPS 20%    Time Total: 70 MINUTES Greater than 50%  of this time was spent counseling and coordinating care related to the above assessment and plan.  Juel Burrow, DNP, AGNP-C Palliative Medicine Team [phone] Pager: [phone]

## 2019-09-02 ENCOUNTER — Ambulatory Visit: Payer: Medicare Other

## 2019-09-02 LAB — GLUCOSE, CAPILLARY
Glucose-Capillary: 148 mg/dL — ABNORMAL HIGH (ref 70–99)
Glucose-Capillary: 243 mg/dL — ABNORMAL HIGH (ref 70–99)
Glucose-Capillary: 193 mg/dL — ABNORMAL HIGH (ref 70–99)
Glucose-Capillary: 81 mg/dL (ref 70–99)

## 2019-09-02 LAB — BASIC METABOLIC PANEL
Anion gap: 9 (ref 5–15)
BUN: 18 mg/dL (ref 8–23)
CO2: 25 mmol/L (ref 22–32)
Calcium: 8.5 mg/dL — ABNORMAL LOW (ref 8.9–10.3)
Chloride: 113 mmol/L — ABNORMAL HIGH (ref 98–111)
Creatinine, Ser: 1.62 mg/dL — ABNORMAL HIGH (ref 0.44–1.00)
GFR calc Af Amer: 35 mL/min — ABNORMAL LOW (ref >60)
GFR calc non Af Amer: 30 mL/min — ABNORMAL LOW (ref >60)
Glucose, Bld: 161 mg/dL — ABNORMAL HIGH (ref 70–99)
Potassium: 4.8 mmol/L (ref 3.5–5.1)
Sodium: 147 mmol/L — ABNORMAL HIGH (ref 135–145)

## 2019-09-02 NOTE — Progress Notes (Signed)
[name], female   DOB: [birth year], 79 y.o.   MRN: [id number]  PROGRESS NOTE    Caitlyn Marshall  TGY:[id number] DOB: [birth year] DOA: 08/30/2019 PCP: Birdie Sons, MD   Brief Narrative:  79 year old female with chronic kidney disease stage IIIb, type 1 diabetes mellitus on insulin, possible dementia, hypertension, left lung mass with pleural effusion diagnosed in March 2021 with PET scan suggestive of bronchogenic carcinoma stage IIIb for which she was not a candidate for biopsy or chemotherapy because of her comorbidities and frailty; referred to palliative radiation and was seen by palliative/oncology recently and recommended hospice which family was not yet convinced of.  She presented to the ED with abdominal and low back pain, frequent urination along with nausea and weakness.  Found to have accelerated hypertension in the ED with acute on chronic CKD, hypokalemia and hyperglycemia.  Admitted for further management.  Assessment & Plan:  FTT (failure to thrive) in adult Secondary to underlying lung malignancy, poor nutrition and overall deconditioning. Continue gentle hydration. She is not a candidate for systemic chemotherapy and was referred to palliative radiation. -Palliative/oncology had recently seen her and had recommended hospice which family was not yet convinced of. -Continue oxycodone every 6 hours as needed for pain.   -Oral intake has been very poor during the hospitalization -Palliative care following and family is thinking about home/residential hospice.  Family wants to continue IV fluids for now.  Type 1diabetes mellitus with other diabetic kidney complication (HCC) with hyperglycemia and hypoglycemia -Blood sugars improving.  Continue CBGs with SSI.Marland Kitchen  Accelerated hypertension/hypertensive crisis -Improved after receiving clonidine in the ED.   continue amlodipine and added as needed hydralazine.  Blood pressure currently stable.  Acute  kidney injury on chronic kidney disease stage IIIb -Secondary to dehydration.  Improved with IV fluids. Continue to hold irbesartan.  Bronchogenic lung cancer, left (HCC) Pleural effusion on left -No respiratory symptoms and does not need thoracentesis at present. Palliative care on board.  Overall prognosis is very poor.  DVT prophylaxis: Subcutaneous Lovenox Code Status: DNR Family Communication: None at bedside Disposition Plan: Status is: Inpatient  Remains inpatient appropriate because:IV treatments appropriate due to intensity of illness or inability to take PO.  Would be appropriate for home/residential hospice if family agreeable   Dispo: The patient is from: Home              Anticipated d/c is to: Home/essential hospice if family agreeable              Anticipated d/c date is: 1 day              Patient currently is not medically stable to d/c.   Consultants: Palliative care  Procedures: None Antimicrobials: None   Subjective: Patient seen and examined at bedside.  Awake but a very poor historian, slightly confused, very slow to respond.  Oral intake is very poor as per nursing staff.  No overnight fever or vomiting reported. Objective: Vitals:   09/01/19 2130 09/01/19 2230 09/01/19 2330 09/02/19 0020  BP:    (!) 130/58  Pulse:    98  Resp: 19 20 (!) 21 18  Temp:    98.6 F (37 C)  TempSrc:    Oral  SpO2:      Weight:      Height:        Intake/Output Summary (Last 24 hours) at 09/02/2019 0726 Last data filed at 09/02/2019 0409 Gross per 24 hour  Intake  682.49 ml  Output 525 ml  Net 157.49 ml   Filed Weights   08/30/19 0934  Weight: 40.5 kg    Examination:  General exam: No acute distress.  Awake, slightly confused, very slow to respond to questions.  Looks cachectic. Respiratory system: Bilateral decreased breath sounds at bases with scattered crackles cardiovascular system: Rate controlled, S1-S2 heard Gastrointestinal system: Abdomen is  nondistended, soft and nontender.  Bowel sounds heard  extremities: No cyanosis, clubbing, edema    Data Reviewed: I have personally reviewed following labs and imaging studies  CBC: Recent Labs  Lab 08/30/19 0939  WBC 12.7*  HGB 12.8  HCT 38.4  MCV 79.7*  PLT 010   Basic Metabolic Panel: Recent Labs  Lab 08/30/19 0939 08/31/19 0809 09/02/19 0538  NA 146* 145 147*  K 3.2* 3.8 4.8  CL 108 112* 113*  CO2 27 23 25   GLUCOSE 49* 249* 161*  BUN 17 13 18   CREATININE 1.84* 1.36* 1.62*  CALCIUM 8.8* 8.1* 8.5*   GFR: Estimated Creatinine Clearance: 18.6 mL/min (A) (by C-G formula based on SCr of 1.62 mg/dL (H)). Liver Function Tests: No results for input(s): AST, ALT, ALKPHOS, BILITOT, PROT, ALBUMIN in the last 168 hours. No results for input(s): LIPASE, AMYLASE in the last 168 hours. No results for input(s): AMMONIA in the last 168 hours. Coagulation Profile: No results for input(s): INR, PROTIME in the last 168 hours. Cardiac Enzymes: No results for input(s): CKTOTAL, CKMB, CKMBINDEX, TROPONINI in the last 168 hours. BNP (last 3 results) No results for input(s): PROBNP in the last 8760 hours. HbA1C: No results for input(s): HGBA1C in the last 72 hours. CBG: Recent Labs  Lab 08/31/19 2003 08/31/19 2128 09/01/19 0803 09/01/19 1633 09/01/19 2033  GLUCAP 111* 105* 190* 224* 191*   Lipid Profile: No results for input(s): CHOL, HDL, LDLCALC, TRIG, CHOLHDL, LDLDIRECT in the last 72 hours. Thyroid Function Tests: No results for input(s): TSH, T4TOTAL, FREET4, T3FREE, THYROIDAB in the last 72 hours. Anemia Panel: No results for input(s): VITAMINB12, FOLATE, FERRITIN, TIBC, IRON, RETICCTPCT in the last 72 hours. Sepsis Labs: No results for input(s): PROCALCITON, LATICACIDVEN in the last 168 hours.  Recent Results (from the past 240 hour(s))  SARS Coronavirus 2 by RT PCR (hospital order, performed in [facility] hospital lab) Nasopharyngeal Nasopharyngeal Swab      Status: None   Collection Time: 08/30/19  2:55 PM   Specimen: Nasopharyngeal Swab  Result Value Ref Range Status   SARS Coronavirus 2 NEGATIVE NEGATIVE Final    Comment: (NOTE) SARS-CoV-2 target nucleic acids are NOT DETECTED.  The SARS-CoV-2 RNA is generally detectable in upper and lower respiratory specimens during the acute phase of infection. The lowest concentration of SARS-CoV-2 viral copies this assay can detect is 250 copies / mL. A negative result does not preclude SARS-CoV-2 infection and should not be used as the sole basis for treatment or other patient management decisions.  A negative result may occur with improper specimen collection / handling, submission of specimen other than nasopharyngeal swab, presence of viral mutation(s) within the areas targeted by this assay, and inadequate number of viral copies (<250 copies / mL). A negative result must be combined with clinical observations, patient history, and epidemiological information.  Fact Sheet for Patients:   StrictlyIdeas.no  Fact Sheet for Healthcare Providers: BankingDealers.co.za  This test is not yet approved or  cleared by the [place] FDA and has been authorized for detection and/or diagnosis of SARS-CoV-2 by FDA under  an Emergency Use Authorization (EUA).  This EUA will remain in effect (meaning this test can be used) for the duration of the COVID-19 declaration under Section 564(b)(1) of the Act, 21 U.S.C. section 360bbb-3(b)(1), unless the authorization is terminated or revoked sooner.  Performed at [facility], [address], [place], [place] [address]          Radiology Studies: No results found.      Scheduled Meds: . allopurinol  100 mg Oral Daily  . amLODipine  10 mg Oral Daily  . ascorbic acid  500 mg Oral Daily  . aspirin EC  81 mg Oral Daily  . donepezil  10 mg Oral QHS  . doxazosin  4 mg Oral QHS  . enoxaparin  (LOVENOX) injection  30 mg Subcutaneous Q24H  . insulin aspart  0-9 Units Subcutaneous TID WC  . insulin glargine  4 Units Subcutaneous Daily  . levothyroxine  50 mcg Oral Daily  . linaclotide  145 mcg Oral QAC breakfast  . mouth rinse  15 mL Mouth Rinse BID  . megestrol  20 mg Oral Daily  . mirtazapine  15 mg Oral QHS  . montelukast  10 mg Oral Daily  . pravastatin  20 mg Oral q1800   Continuous Infusions: . sodium chloride 50 mL/hr at 09/02/19 0409          Aline August, MD [facility] 09/02/2019, 7:26 AM

## 2019-09-02 NOTE — Progress Notes (Signed)
Manufacturing engineer hospital Liaison Note:  New referral for [facility] hospice home received from Palliative NP Kathie Rhodes, TOC Doran Clay made aware.  Patient information sent to referral, Hospice Home eligibility pending.  Writer spoke via telephone to patient's daughter Jeannene Patella to initiate education regarding hospice services both at the Hospice home and at the patient's home.  [facility] is unable to offer a bed today. Both Pam and TOC aware. Writer to meet with family tomorrow afternoon. Thank you for this referral.  Flo Shanks BSN, RN, [place] [phone]

## 2019-09-02 NOTE — Progress Notes (Signed)
Daily Progress Note   Patient Name: Caitlyn Marshall       Date: 09/02/2019 DOB: 23-Aug-1940  Age: 79 y.o. MRN#: [id number] Attending Physician: Aline August, MD Primary Care Physician: Birdie Sons, MD Admit Date: 08/30/2019  Reason for Consultation/Follow-up: Establishing goals of care  Subjective: Patient more awake and interactive today, just tells me she wants to go home  Length of Stay: 2  Current Medications: Scheduled Meds:  . allopurinol  100 mg Oral Daily  . amLODipine  10 mg Oral Daily  . ascorbic acid  500 mg Oral Daily  . aspirin EC  81 mg Oral Daily  . donepezil  10 mg Oral QHS  . doxazosin  4 mg Oral QHS  . insulin aspart  0-9 Units Subcutaneous TID WC  . insulin glargine  4 Units Subcutaneous Daily  . levothyroxine  50 mcg Oral Daily  . linaclotide  145 mcg Oral QAC breakfast  . mouth rinse  15 mL Mouth Rinse BID  . megestrol  20 mg Oral Daily  . mirtazapine  15 mg Oral QHS  . montelukast  10 mg Oral Daily  . pravastatin  20 mg Oral q1800    Continuous Infusions: . sodium chloride 50 mL/hr at 09/02/19 0409    PRN Meds: acetaminophen **OR** acetaminophen, albuterol, ALPRAZolam, hydrALAZINE, ondansetron **OR** ondansetron (ZOFRAN) IV, oxyCODONE  Physical Exam Constitutional:      General: She is not in acute distress. Pulmonary:     Effort: Pulmonary effort is normal.  Skin:    General: Skin is warm and dry.  Neurological:     Mental Status: She is alert. She is disoriented.             Vital Signs: BP (!) 141/59   Pulse 98   Temp 98.2 F (36.8 C) (Oral)   Resp 20   Ht 5\' 6"  (1.676 m)   Wt 40.5 kg   SpO2 98%   BMI 14.40 kg/m  SpO2: SpO2: 98 % O2 Device: O2 Device: Room Air O2 Flow Rate:    Intake/output summary:   Intake/Output Summary (Last  24 hours) at 09/02/2019 1615 Last data filed at 09/02/2019 0409 Gross per 24 hour  Intake 442.49 ml  Output 350 ml  Net 92.49 ml   LBM:   Baseline Weight: Weight: 40.5 kg Most recent weight: Weight: 40.5 kg       Palliative Assessment/Data: PPS 40%    Flowsheet Rows     Most Recent Value  Intake Tab  Referral Department Hospitalist  Unit at Time of Referral Med/Surg Unit  Palliative Care Primary Diagnosis Cancer  Date Notified 08/30/19  Palliative Care Type New Palliative care  Reason for referral Clarify Goals of Care  Date of Admission 08/30/19  Date first seen by Palliative Care 08/31/19  # of days Palliative referral response time 1 Day(s)  # of days IP prior to Palliative referral 0  Clinical Assessment  Palliative Performance Scale Score 20%  Psychosocial & Spiritual Assessment  Palliative Care Outcomes  Patient/Family meeting held? Yes  Who was at the meeting? daughter and cousin  Palliative Care Outcomes Clarified goals of care, Counseled regarding hospice, Provided end of life care  assistance, Provided psychosocial or spiritual support, Transitioned to hospice      Patient Active Problem List   Diagnosis Date Noted  . Goals of care, counseling/discussion   . DNR (do not resuscitate)   . Palliative care by specialist   . Hypoglycemia 08/31/2019  . Hypertensive crisis 08/30/2019  . FTT (failure to thrive) in adult 08/30/2019  . Dementia without behavioral disturbance ([place]) 08/30/2019  . Bronchogenic lung cancer, left ([place]) 08/30/2019  . Pleural effusion on left 08/30/2019  . Altered mental status 07/08/2019  . Diabetic retinopathy ([place]) 02/23/2018  . Nodule of lower lobe of left lung 04/17/2017  . Other disorders of lung 04/17/2017  . Aortic atherosclerosis ([place]) 03/18/2017  . Insomnia 07/12/2015  . Hypertension 07/12/2015  . Poor appetite 07/12/2015  . Constipation 07/12/2015  . Bursitis 05/31/2015  . 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 Leg swelling 05/31/2015  . Allergic rhinitis 12/26/2014  . Vitamin D deficiency 12/20/2014  . Anxiety 12/20/2014  . Hypercholesterolemia 12/20/2014  . Anemia 12/20/2014  . Hypothyroidism 12/20/2014  . Hypertensive CKD (chronic kidney disease) 12/20/2014  . Premature ventricular contraction 12/20/2014  . PVD (peripheral vascular disease) ([place]) 12/20/2014  . Gout 12/20/2014  . Osteoarthritis 12/20/2014  . Renal insufficiency 12/20/2014    Palliative Care Assessment & Plan   HPI: 79 y.o. female  with past medical history of presumed lung cancer (unable to tolerate biopsy), CKD 3, T1DM, dementia, HTN, and HLD admitted on 08/30/2019 with abdomen and low back pain. Found to have accelerated htn, aki, hyperkalemia, and hypoglycemia on admission. Patient diagnosed with FTT d/t malignancy. Patient has not been a candidate for chemotherapy but was going to receive palliative radiation; however, she was hospitalized prior to initiation. PMT consulted to assist with [place].     Assessment: Follow up with patient and daughter today - patient is interactive today and took in about 30% of breakfast. Patient is confused and is unable to participate in goals of care conversation.  Daughter [name] is at bedside. We reviewed our conversation yesterday. She tells me she has spoken with other family and they are now more interested in hospice facility than going home with hospice. We again reviewed hospice philosophy of care. [name] continues to wish for continuation of current measures here in the hospital such as IV fluids. We discuss that these measures would not continue at hospice facility and we expect a decline when she is no longer receiving these interventions. [name] expresses understanding to this. She asks that I make referral to hospice facility.    Recommendations/Plan:  Situation discussed with hospice liaison and TOC - family prefers hospice facility over  home with hospice - **please note family is opposed to morphine but would accept other opioids if needed for comfort**  Code Status:  DNR  Prognosis:   poor prognosis r/t failure to thrive and malignancy  Discharge Planning: Family interested in hospice facility placement - have discussed with hospice liaison  Care plan was discussed with patient's daughter, RN, hospice liaison, transitions of care team  Thank you for allowing the Palliative Medicine Team to assist in the care of this patient.   Total Time 25 minutes Prolonged Time Billed  no       Greater than 50%  of this time was spent counseling and coordinating care related to the above assessment and plan.  Juel Burrow, DNP, [facility] Palliative Medicine Team Team Phone # [phone]  Pager [phone]

## 2019-09-02 NOTE — Progress Notes (Signed)
Pt refused all PO meds this shift.

## 2019-09-03 ENCOUNTER — Ambulatory Visit: Payer: Medicare Other

## 2019-09-03 ENCOUNTER — Telehealth: Payer: Self-pay | Admitting: *Deleted

## 2019-09-03 LAB — GLUCOSE, CAPILLARY
Glucose-Capillary: 135 mg/dL — ABNORMAL HIGH (ref 70–99)
Glucose-Capillary: 166 mg/dL — ABNORMAL HIGH (ref 70–99)
Glucose-Capillary: 186 mg/dL — ABNORMAL HIGH (ref 70–99)
Glucose-Capillary: 189 mg/dL — ABNORMAL HIGH (ref 70–99)
Glucose-Capillary: 61 mg/dL — ABNORMAL LOW (ref 70–99)
Glucose-Capillary: 92 mg/dL (ref 70–99)

## 2019-09-03 MED ORDER — DEXTROSE 50 % IV SOLN
INTRAVENOUS | Status: AC
  Administered 2019-09-03: 50 mL
  Filled 2019-09-03: qty 50

## 2019-09-03 MED ORDER — DEXTROSE IN LACTATED RINGERS 5 % IV SOLN: INTRAVENOUS | Status: DC

## 2019-09-03 NOTE — TOC Transition Note (Signed)
Transition of Care [facility]) - CM/SW Discharge Note   Patient Details  Name: Caitlyn Marshall MRN: [id number] Date of Birth: 20-May-1940  [facility]) CM/SW Contact:  Meriel Flavors, LCSW Phone Number: 09/03/2019, 3:53 PM   Clinical Narrative:    Patient will likely discharge Saturday 6/19. 6/18: Patient and family have decided to take the patient home with hospice services as per Santiago Glad with [facility]          Patient Goals and CMS Choice        Discharge Placement                       Discharge Plan and Services                                     Social Determinants of Health (SDOH) Interventions     Readmission Risk Interventions No flowsheet data found.

## 2019-09-03 NOTE — Telephone Encounter (Signed)
Hospice?  Yes.

## 2019-09-03 NOTE — Telephone Encounter (Signed)
Patient being discharged from hospital and asking if Dr [name] will serve as attending, They plan to open her to services toomorrow

## 2019-09-03 NOTE — Progress Notes (Signed)
Cross Cover Brief Note Patient with hypoglycemic event. Nurse reports patietn refusing to eat. Lantus discontinued.  IV fluids changed to D5LR (NA = 147).

## 2019-09-03 NOTE — Progress Notes (Signed)
[facility] Liaison note:  Caitlyn Marshall is unable to offer a bed today. Writer spoke with patient's daughter Caitlyn Marshall in the room, she has spoken with her other family members and they have decided to take [name] back home with the support of hospice services. She would like for discharge home tomorrow via EMS. TOC [name] notified via [place] and voice mail.  Hospice contact information given to [facility]. Please fax the completed discharge summary tom [phone].  Thank you.  Flo Shanks BSN, RN, [place] [phone]

## 2019-09-03 NOTE — Telephone Encounter (Signed)
VERBAL ORDER called to [facility]

## 2019-09-03 NOTE — Progress Notes (Signed)
[name], female   DOB: 1940-06-01, 79 y.o.   MRN: [id number]  PROGRESS NOTE    Caitlyn Marshall  BEM:[id number] DOB: 12/28/1940 DOA: 08/30/2019 PCP: Birdie Sons, MD   Brief Narrative:  79 year old female with chronic kidney disease stage IIIb, type 1 diabetes mellitus on insulin, possible dementia, hypertension, left lung mass with pleural effusion diagnosed in March 2021 with PET scan suggestive of bronchogenic carcinoma stage IIIb for which she was not a candidate for biopsy or chemotherapy because of her comorbidities and frailty; referred to palliative radiation and was seen by palliative/oncology recently and recommended hospice which family was not yet convinced of.  She presented to the ED with abdominal and low back pain, frequent urination along with nausea and weakness.  Found to have accelerated hypertension in the ED with acute on chronic CKD, hypokalemia and hyperglycemia.  Admitted for further management.  During the hospitalization, after palliative care discussions, patient/family has decided to pursue residential hospice.  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  Family still wants her to be getting IV fluids still she gets transferred to hospice.  Continue rest of the management for now except for any further blood work or imaging.  DVT prophylaxis: DC Lovenox for comfort measures  code Status: DNR Family Communication: None at bedside Disposition Plan: Status is: Inpatient  Remains inpatient appropriate because:IV treatments appropriate due to intensity of illness  or inability to take PO.  Patient will need residential hospice placement  Dispo: The patient is from: Home              Anticipated d/c is to: Residential hospice              Anticipated d/c date is: 1 day              Patient currently is medically stable to d/c.  She is medically stable to discharge to residential hospice   Consultants: Palliative care  Procedures: None Antimicrobials: None   Subjective: Patient seen and examined at bedside.  Awake but a very poor historian.  No overnight fever, vomiting reported.   Objective: Vitals:   09/02/19 1924 09/03/19 0002 09/03/19 0359 09/03/19 0816  BP: (!) 139/55 (!) 163/65 (!) 174/61 (!) 155/59  Pulse: 88 92 100 86  Resp: 16 17 17 15   Temp: 98.2 F (36.8 C) 97.8 F (36.6 C) 98.5 F (36.9 C) 97.6 F (36.4 C)  TempSrc: Oral Oral Oral   SpO2: 97% 100% 97% 95%  Weight:      Height:        Intake/Output Summary (Last 24 hours) at 09/03/2019 1024 Last data filed at 09/03/2019 0427 Gross per 24 hour  Intake --  Output 300 ml  Net -300 ml   Filed Weights   08/30/19 0934  Weight: 40.5 kg    Examination:  General exam: No distress.  Looks cachectic.  Awake, slow to respond, slightly confused.   Respiratory system: Bilateral decreased breath sounds at bases with some scattered crackles  cardiovascular system: S1-S2 heard, rate controlled Gastrointestinal system: Abdomen is nondistended, soft and nontender.  Bowel sounds heard  extremities: No edema or cyanosis   Data Reviewed: I have  personally reviewed following labs and imaging studies  CBC: Recent Labs  Lab 08/30/19 0939  WBC 12.7*  HGB 12.8  HCT 38.4  MCV 79.7*  PLT 161   Basic Metabolic Panel: Recent Labs  Lab 08/30/19 0939 08/31/19 0809 09/02/19 0538  NA 146* 145 147*  K 3.2* 3.8 4.8  CL 108 112* 113*  CO2 27 23 25   GLUCOSE 49* 249* 161*  BUN 17 13 18   CREATININE 1.84* 1.36* 1.62*  CALCIUM 8.8* 8.1* 8.5*   GFR: Estimated Creatinine Clearance:  18.6 mL/min (A) (by C-G formula based on SCr of 1.62 mg/dL (H)). Liver Function Tests: No results for input(s): AST, ALT, ALKPHOS, BILITOT, PROT, ALBUMIN in the last 168 hours. No results for input(s): LIPASE, AMYLASE in the last 168 hours. No results for input(s): AMMONIA in the last 168 hours. Coagulation Profile: No results for input(s): INR, PROTIME in the last 168 hours. Cardiac Enzymes: No results for input(s): CKTOTAL, CKMB, CKMBINDEX, TROPONINI in the last 168 hours. BNP (last 3 results) No results for input(s): PROBNP in the last 8760 hours. HbA1C: No results for input(s): HGBA1C in the last 72 hours. CBG: Recent Labs  Lab 09/02/19 0816 09/02/19 1219 09/02/19 1638 09/02/19 2052 09/03/19 0827  GLUCAP 148* 243* 193* 81 135*   Lipid Profile: No results for input(s): CHOL, HDL, LDLCALC, TRIG, CHOLHDL, LDLDIRECT in the last 72 hours. Thyroid Function Tests: No results for input(s): TSH, T4TOTAL, FREET4, T3FREE, THYROIDAB in the last 72 hours. Anemia Panel: No results for input(s): VITAMINB12, FOLATE, FERRITIN, TIBC, IRON, RETICCTPCT in the last 72 hours. Sepsis Labs: No results for input(s): PROCALCITON, LATICACIDVEN in the last 168 hours.  Recent Results (from the past 240 hour(s))  SARS Coronavirus 2 by RT PCR (hospital order, performed in [facility] hospital lab) Nasopharyngeal Nasopharyngeal Swab     Status: None   Collection Time: 08/30/19  2:55 PM   Specimen: Nasopharyngeal Swab  Result Value Ref Range Status   SARS Coronavirus 2 NEGATIVE NEGATIVE Final    Comment: (NOTE) SARS-CoV-2 target nucleic acids are NOT DETECTED.  The SARS-CoV-2 RNA is generally detectable in upper and lower respiratory specimens during the acute phase of infection. The lowest concentration of SARS-CoV-2 viral copies this assay can detect is 250 copies / mL. A negative result does not preclude SARS-CoV-2 infection and should not be used as the sole basis for treatment or other patient  management decisions.  A negative result may occur with improper specimen collection / handling, submission of specimen other than nasopharyngeal swab, presence of viral mutation(s) within the areas targeted by this assay, and inadequate number of viral copies (<250 copies / mL). A negative result must be combined with clinical observations, patient history, and epidemiological information.  Fact Sheet for Patients:   StrictlyIdeas.no  Fact Sheet for Healthcare Providers: BankingDealers.co.za  This test is not yet approved or  cleared by the [place] FDA and has been authorized for detection and/or diagnosis of SARS-CoV-2 by FDA under an Emergency Use Authorization (EUA).  This EUA will remain in effect (meaning this test can be used) for the duration of the COVID-19 declaration under Section 564(b)(1) of the Act, 21 U.S.C. section 360bbb-3(b)(1), unless the authorization is terminated or revoked sooner.  Performed at [facility], [address] [place], [place] [address]          Radiology Studies: No results found.      Scheduled Meds: . allopurinol  100 mg Oral Daily  . amLODipine  10 mg Oral Daily  . ascorbic acid  500 mg Oral Daily  . aspirin EC  81 mg Oral Daily  . donepezil  10 mg Oral QHS  . doxazosin  4 mg Oral QHS  . insulin aspart  0-9 Units Subcutaneous TID WC  . insulin glargine  4 Units Subcutaneous Daily  . levothyroxine  50 mcg Oral Daily  . linaclotide  145 mcg Oral QAC breakfast  . mouth rinse  15 mL Mouth Rinse BID  . megestrol  20 mg Oral Daily  . mirtazapine  15 mg Oral QHS  . montelukast  10 mg Oral Daily  . pravastatin  20 mg Oral q1800   Continuous Infusions: . sodium chloride 50 mL/hr at 09/02/19 0409          Aline August, MD [facility] 09/03/2019, 10:24 AM

## 2019-09-04 LAB — GLUCOSE, CAPILLARY
Glucose-Capillary: 216 mg/dL — ABNORMAL HIGH (ref 70–99)
Glucose-Capillary: 190 mg/dL — ABNORMAL HIGH (ref 70–99)
Glucose-Capillary: 130 mg/dL — ABNORMAL HIGH (ref 70–99)
Glucose-Capillary: 170 mg/dL — ABNORMAL HIGH (ref 70–99)

## 2019-09-04 MED ORDER — ALPRAZOLAM 0.25 MG PO TABS: 0.2500 mg | ORAL_TABLET | Freq: Four times a day (QID) | ORAL | 0 refills | Status: AC | PRN

## 2019-09-04 MED ORDER — ALLOPURINOL 100 MG PO TABS: 100.0000 mg | ORAL_TABLET | Freq: Every day | ORAL | Status: AC

## 2019-09-04 NOTE — Progress Notes (Addendum)
Updated family on dc process still waiting on hospice set up before we can call EMS for transport 1634. Called pamela (daughter) and reviewed AVS with family. Awaiting ems at this time.

## 2019-09-04 NOTE — Progress Notes (Signed)
Pt d/c'd home in the company of EMS. D/c instructions reviewed with family on previous shift. Attempted to call daughter [name] to notify her of EMS arrival for transport, No answer and mailbox full.

## 2019-09-04 NOTE — Discharge Summary (Signed)
Physician Discharge Summary  Caitlyn Marshall YCX:[id number] DOB: Sep 01, 1940 DOA: 08/30/2019  PCP: Birdie Sons, MD  Admit date: 08/30/2019 Discharge date: 09/04/2019  Admitted From: Home Disposition: Home with hospice  Recommendations for Outpatient Follow-up:  1. Follow up with home hospice at earliest [place]: No Equipment/Devices: None  Discharge Condition: Poor CODE STATUS: DNR Diet recommendation: As per comfort measures  Brief/Interim Summary: 79 year old female with chronic kidney disease stage IIIb, type 1 diabetes mellitus on insulin, possible dementia, hypertension, left lung mass with pleural effusion diagnosed in March 2021 with PET scan suggestive of bronchogenic carcinoma stage IIIb for which she was not a candidate for biopsy or chemotherapy because of her comorbidities and frailty; referred to palliative radiation and was seen by palliative/oncology recently and recommended hospice which family was not yet convinced of.  She presented to the ED with abdominal and low back pain, frequent urination along with nausea and weakness.  Found to have accelerated hypertension in the ED with acute on chronic CKD, hypokalemia and hyperglycemia.  Admitted for further management.  During the hospitalization, after palliative care discussions, patient/family has decided to pursue residential hospice.  Subsequently, family decided to pursue home hospice.  She will be discharged to home with hospice today.  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  - Subsequently, family decided  to pursue home hospice.  She will be discharged to home with hospice today.  Will DC all forms of insulin as her oral intake is poor and blood sugars are on the lower side.  Other oral medications can be subsequently discontinued at home if her oral intake worsens.  Discharge Instructions   Allergies as of 09/04/2019   No Known Allergies     Medication List    STOP taking these medications   aspirin EC 81 MG tablet   FreeStyle Libre 14 Day Sensor Misc   FreeStyle Libre Reader Devi   insulin NPH Human 100 UNIT/ML injection Commonly known as: HumuLIN N   insulin regular 100 units/mL injection Commonly known as: HumuLIN R   irbesartan 300 MG tablet Commonly known as: AVAPRO   ONE TOUCH ULTRA TEST test strip Generic drug: glucose blood   Vitamin D-3 125 MCG (5000 UT) Tabs     TAKE these medications   albuterol 108 (90 Base) MCG/ACT inhaler Commonly known as: VENTOLIN HFA Inhale 2 puffs into the lungs every 6 (six) hours as needed for wheezing or shortness of breath.   allopurinol 100 MG tablet Commonly known as: ZYLOPRIM Take 1 tablet (100 mg total) by mouth daily. What changed: See the new instructions.   ALPRAZolam 0.25 MG tablet Commonly known as: XANAX Take 1 tablet (0.25 mg total) by mouth every 6 (six) hours as needed for anxiety. What changed: reasons to take this   amLODipine 10 MG tablet Commonly known as: NORVASC Take 10 mg by mouth daily.   ascorbic acid 500 MG tablet Commonly known as: VITAMIN C Take 500 mg by mouth daily.   donepezil 10 MG tablet Commonly known as: Aricept Take 1 tablet (10 mg total) by mouth at bedtime.   doxazosin 4 MG tablet Commonly known as: CARDURA Take 4 mg by mouth at  bedtime.   levothyroxine 50 MCG tablet Commonly known as: SYNTHROID Take 1 tablet by mouth daily.   linaclotide 145 MCG Caps capsule Commonly known as: Linzess Take 1 capsule (145 mcg total) by mouth daily before breakfast.   lovastatin 20 MG  tablet Commonly known as: MEVACOR TAKE 1 TABLET BY MOUTH DAILY   megestrol 20 MG tablet Commonly known as: MEGACE Take 1 tablet (20 mg total) by mouth daily.   mirtazapine 15 MG tablet Commonly known as: Remeron Take 1 tablet (15 mg total) by mouth at bedtime.   montelukast 10 MG tablet Commonly known as: SINGULAIR TAKE 1 TABLET BY MOUTH EVERY DAY   ondansetron 4 MG tablet Commonly known as: ZOFRAN Take 4 mg by mouth every 8 (eight) hours as needed for nausea or vomiting.   oxyCODONE 5 MG immediate release tablet Commonly known as: Oxy IR/ROXICODONE Take 0.5-1 tablets (2.5-5 mg total) by mouth every 6 (six) hours as needed for severe pain.       No Known Allergies  Consultations:  Palliative care   Procedures/Studies: CT ABDOMEN PELVIS WO CONTRAST  Result Date: 08/30/2019 CLINICAL DATA:  Back pain, abdominal pain.  History of lung cancer EXAM: CT ABDOMEN AND PELVIS WITHOUT CONTRAST TECHNIQUE: Multidetector CT imaging of the abdomen and pelvis was performed following the standard protocol without IV contrast. COMPARISON:  CT 06/15/2019 FINDINGS: Lower chest: Moderate sized left-sided pleural effusion with associated atelectasis. Partially visualized masslike consolidation within the lingula. Nodularity along the left heart border may represent pericardial lymph nodes or areas of pleural metastatic disease (series 2, images 8-10). This is more prominent in appearance compared to prior. Hepatobiliary: Stable small hypoattenuating hepatic lesions. No new focal hepatic mass. Gallbladder appears unremarkable. No hyperdense gallstone or biliary dilatation. Pancreas: Pancreatic parenchymal atrophy with numerous coarse calcifications reflecting sequela of chronic pancreatitis. No peripancreatic inflammatory changes or fluid. Spleen: Unremarkable. Adrenals/Urinary Tract: No adrenal mass identified. Bilateral punctate nonobstructing renal calculi. No hydronephrosis. Urinary bladder appears  unremarkable. Stomach/Bowel: No evidence of bowel obstruction or acute bowel inflammation, although evaluation is limited by patient's cachexia and lack of intra-abdominal fat. Appendix not definitively visualized. Vascular/Lymphatic: Marked aortoiliac atherosclerotic calcification without evidence of aneurysm. No definite abdominopelvic lymphadenopathy within the limitations of this exam. Reproductive: Grossly unremarkable. Other: Unchanged soft tissue prominence along the lower anterior abdominal wall, nonspecific, but may be injection related. No ascites or intra-abdominal fluid collection. No free intraperitoneal air. Musculoskeletal: No acute or significant osseous findings. No suspicious osseous lesion IMPRESSION: 1. Moderate sized left-sided pleural effusion with associated atelectasis, slightly increased in size from prior. Partially visualized mass-like consolidation within the lingula. 2. Nodularity along the left heart border may represent pericardial lymph nodes or areas of pleural metastatic disease. This is more prominent in appearance compared to prior study. 3. No acute abdominopelvic findings. 4. Bilateral punctate nonobstructing renal calculi. 5. Sequela of chronic pancreatitis. 6. Aortic atherosclerosis.  (ICD10-I70.0). Electronically Signed   By: Davina Poke D.O.   On: 08/30/2019 11:33       Subjective: Patient seen and examined at bedside.  Sleepy, hardly wakes up and calling her name.  No overnight fever or vomiting reported.  Oral intake has been very poor as per nursing staff.  Discharge Exam: Vitals:   09/04/19 0420 09/04/19 0815  BP: (!) 145/52 (!) 147/52  Pulse: 96 74  Resp: 16 16  Temp: 98.5 F (36.9 C) 98.7 F (37.1 C)  SpO2: 98% 97%    General: Pt is sleepy, looks chronically  ill, very thinly built.  No distress.  Hardly wakes up and calling her name. Cardiovascular: rate controlled, S1/S2 + Respiratory: bilateral decreased breath sounds at bases with  scattered crackles Abdominal: Soft, NT, ND, bowel sounds + Extremities: no edema, no cyanosis    The results of significant diagnostics from this hospitalization (including imaging, microbiology, ancillary and laboratory) are listed below for reference.     Microbiology: Recent Results (from the past 240 hour(s))  SARS Coronavirus 2 by RT PCR (hospital order, performed in [facility] hospital lab) Nasopharyngeal Nasopharyngeal Swab     Status: None   Collection Time: 08/30/19  2:55 PM   Specimen: Nasopharyngeal Swab  Result Value Ref Range Status   SARS Coronavirus 2 NEGATIVE NEGATIVE Final    Comment: (NOTE) SARS-CoV-2 target nucleic acids are NOT DETECTED.  The SARS-CoV-2 RNA is generally detectable in upper and lower respiratory specimens during the acute phase of infection. The lowest concentration of SARS-CoV-2 viral copies this assay can detect is 250 copies / mL. A negative result does not preclude SARS-CoV-2 infection and should not be used as the sole basis for treatment or other patient management decisions.  A negative result may occur with improper specimen collection / handling, submission of specimen other than nasopharyngeal swab, presence of viral mutation(s) within the areas targeted by this assay, and inadequate number of viral copies (<250 copies / mL). A negative result must be combined with clinical observations, patient history, and epidemiological information.  Fact Sheet for Patients:   StrictlyIdeas.no  Fact Sheet for Healthcare Providers: BankingDealers.co.za  This test is not yet approved or  cleared by the [place] FDA and has been authorized for detection and/or diagnosis of SARS-CoV-2 by FDA under an Emergency Use Authorization (EUA).  This EUA will remain in effect (meaning this test can be used) for the duration of the COVID-19 declaration under Section 564(b)(1) of the Act, 21 U.S.C. section  360bbb-3(b)(1), unless the authorization is terminated or revoked sooner.  Performed at [facility], [address] [place], [place] [address]      Labs: BNP (last 3 results) No results for input(s): BNP in the last 8760 hours. Basic Metabolic Panel: Recent Labs  Lab 08/30/19 0939 08/31/19 0809 09/02/19 0538  NA 146* 145 147*  K 3.2* 3.8 4.8  CL 108 112* 113*  CO2 27 23 25   GLUCOSE 49* 249* 161*  BUN 17 13 18   CREATININE 1.84* 1.36* 1.62*  CALCIUM 8.8* 8.1* 8.5*   Liver Function Tests: No results for input(s): AST, ALT, ALKPHOS, BILITOT, PROT, ALBUMIN in the last 168 hours. No results for input(s): LIPASE, AMYLASE in the last 168 hours. No results for input(s): AMMONIA in the last 168 hours. CBC: Recent Labs  Lab 08/30/19 0939  WBC 12.7*  HGB 12.8  HCT 38.4  MCV 79.7*  PLT 261   Cardiac Enzymes: No results for input(s): CKTOTAL, CKMB, CKMBINDEX, TROPONINI in the last 168 hours. BNP: Invalid input(s): POCBNP CBG: Recent Labs  Lab 09/03/19 2144 09/03/19 2217 09/03/19 2331 09/04/19 0417 09/04/19 0811  GLUCAP 61* 186* 166* 216* 190*   D-Dimer No results for input(s): DDIMER in the last 72 hours. Hgb A1c No results for input(s): HGBA1C in the last 72 hours. Lipid Profile No results for input(s): CHOL, HDL, LDLCALC, TRIG, CHOLHDL, LDLDIRECT in the last 72 hours. Thyroid function studies No results for input(s): TSH, T4TOTAL, T3FREE, THYROIDAB in the last 72 hours.  Invalid input(s): FREET3 Anemia work up No results for  input(s): VITAMINB12, FOLATE, FERRITIN, TIBC, IRON, RETICCTPCT in the last 72 hours. Urinalysis    Component Value Date/Time   COLORURINE YELLOW (A) 08/30/2019 0939   APPEARANCEUR CLEAR (A) 08/30/2019 0939   APPEARANCEUR Cloudy 05/04/2013 2136   LABSPEC 1.012 08/30/2019 0939   LABSPEC 1.015 05/04/2013 2136   PHURINE 5.0 08/30/2019 0939   GLUCOSEU NEGATIVE 08/30/2019 0939   GLUCOSEU >=500 05/04/2013 2136   HGBUR NEGATIVE  08/30/2019 0939   BILIRUBINUR NEGATIVE 08/30/2019 0939   BILIRUBINUR Negative 05/04/2013 2136   KETONESUR NEGATIVE 08/30/2019 0939   PROTEINUR >=300 (A) 08/30/2019 0939   NITRITE NEGATIVE 08/30/2019 0939   LEUKOCYTESUR TRACE (A) 08/30/2019 0939   LEUKOCYTESUR 3+ 05/04/2013 2136   Sepsis Labs Invalid input(s): PROCALCITONIN,  WBC,  LACTICIDVEN Microbiology Recent Results (from the past 240 hour(s))  SARS Coronavirus 2 by RT PCR (hospital order, performed in [facility] hospital lab) Nasopharyngeal Nasopharyngeal Swab     Status: None   Collection Time: 08/30/19  2:55 PM   Specimen: Nasopharyngeal Swab  Result Value Ref Range Status   SARS Coronavirus 2 NEGATIVE NEGATIVE Final    Comment: (NOTE) SARS-CoV-2 target nucleic acids are NOT DETECTED.  The SARS-CoV-2 RNA is generally detectable in upper and lower respiratory specimens during the acute phase of infection. The lowest concentration of SARS-CoV-2 viral copies this assay can detect is 250 copies / mL. A negative result does not preclude SARS-CoV-2 infection and should not be used as the sole basis for treatment or other patient management decisions.  A negative result may occur with improper specimen collection / handling, submission of specimen other than nasopharyngeal swab, presence of viral mutation(s) within the areas targeted by this assay, and inadequate number of viral copies (<250 copies / mL). A negative result must be combined with clinical observations, patient history, and epidemiological information.  Fact Sheet for Patients:   StrictlyIdeas.no  Fact Sheet for Healthcare Providers: BankingDealers.co.za  This test is not yet approved or  cleared by the [place] FDA and has been authorized for detection and/or diagnosis of SARS-CoV-2 by FDA under an Emergency Use Authorization (EUA).  This EUA will remain in effect (meaning this test can be used) for the  duration of the COVID-19 declaration under Section 564(b)(1) of the Act, 21 U.S.C. section 360bbb-3(b)(1), unless the authorization is terminated or revoked sooner.  Performed at [facility], [address], [place], [place] [address]      Time coordinating discharge: 35 minutes  SIGNED:   Aline August, MD  [facility] 09/04/2019, 8:34 AM

## 2019-09-05 ENCOUNTER — Encounter: Payer: Self-pay | Admitting: Family Medicine

## 2019-09-06 ENCOUNTER — Other Ambulatory Visit: Payer: Self-pay

## 2019-09-06 ENCOUNTER — Other Ambulatory Visit: Payer: Medicare Other | Admitting: Nurse Practitioner

## 2019-09-06 ENCOUNTER — Ambulatory Visit: Payer: Medicare Other

## 2019-09-06 NOTE — Telephone Encounter (Signed)
Error

## 2019-09-07 ENCOUNTER — Encounter: Payer: Self-pay | Admitting: *Deleted

## 2019-09-07 ENCOUNTER — Inpatient Hospital Stay: Payer: Medicare Other

## 2019-09-07 ENCOUNTER — Ambulatory Visit: Payer: Medicare Other

## 2019-09-07 NOTE — Progress Notes (Signed)
  Oncology Nurse Navigator Documentation  Navigator Location: [facility] (09/07/19 1400)   )Navigator Encounter Type: Appt/Treatment Plan Review (09/07/19 1400)                         Barriers/Navigation Needs: No Barriers At This Time (09/07/19 1400)   Interventions: None Required (09/07/19 1400)         pt referred to hospice. Nothing further needed at this time.             Time Spent with Patient: 15 (09/07/19 1400)

## 2019-09-08 ENCOUNTER — Ambulatory Visit: Payer: Medicare Other

## 2019-09-09 ENCOUNTER — Inpatient Hospital Stay: Payer: Medicare Other

## 2019-09-09 ENCOUNTER — Ambulatory Visit: Payer: Medicare Other

## 2019-09-10 ENCOUNTER — Ambulatory Visit: Payer: Medicare Other

## 2019-09-13 ENCOUNTER — Ambulatory Visit: Payer: Medicare Other

## 2019-09-14 ENCOUNTER — Ambulatory Visit: Payer: Medicare Other

## 2019-09-14 ENCOUNTER — Inpatient Hospital Stay: Payer: Medicare Other

## 2019-09-15 ENCOUNTER — Ambulatory Visit: Payer: Medicare Other

## 2019-09-16 ENCOUNTER — Ambulatory Visit: Payer: Medicare Other

## 2019-09-17 ENCOUNTER — Ambulatory Visit: Payer: Medicare Other

## 2019-09-21 ENCOUNTER — Ambulatory Visit: Payer: Medicare Other

## 2019-09-21 ENCOUNTER — Inpatient Hospital Stay: Payer: Medicare Other | Attending: Oncology

## 2019-09-22 ENCOUNTER — Ambulatory Visit: Payer: Medicare Other

## 2019-09-23 ENCOUNTER — Ambulatory Visit: Payer: Medicare Other

## 2019-09-24 ENCOUNTER — Ambulatory Visit: Payer: Medicare Other

## 2019-09-26 ENCOUNTER — Ambulatory Visit: Payer: Medicare Other

## 2019-09-27 ENCOUNTER — Ambulatory Visit: Payer: Medicare Other

## 2019-09-28 ENCOUNTER — Ambulatory Visit: Payer: Medicare Other

## 2019-09-29 ENCOUNTER — Ambulatory Visit: Payer: Medicare Other

## 2019-09-30 ENCOUNTER — Ambulatory Visit: Payer: Medicare Other

## 2019-09-30 ENCOUNTER — Ambulatory Visit (INDEPENDENT_AMBULATORY_CARE_PROVIDER_SITE_OTHER): Payer: Medicare Other | Admitting: Pulmonary Disease

## 2019-09-30 ENCOUNTER — Other Ambulatory Visit: Payer: Self-pay

## 2019-09-30 DIAGNOSIS — C3492 Malignant neoplasm of unspecified part of left bronchus or lung: Secondary | ICD-10-CM

## 2019-09-30 DIAGNOSIS — Z515 Encounter for palliative care: Secondary | ICD-10-CM

## 2019-09-30 NOTE — Progress Notes (Signed)
Attempted to reach patient, in all instances phone would be picked up however the patient would not respond.  She is currently under hospice care.  No further attempts were made for follow-up as the patient is under hospice care.  Renold Don, MD  PCCM

## 2019-10-01 ENCOUNTER — Ambulatory Visit: Payer: Medicare Other

## 2019-10-04 ENCOUNTER — Ambulatory Visit: Payer: Medicare Other

## 2019-10-05 ENCOUNTER — Ambulatory Visit: Payer: Medicare Other

## 2019-10-06 ENCOUNTER — Ambulatory Visit: Payer: Medicare Other

## 2019-10-07 ENCOUNTER — Telehealth: Payer: Self-pay | Admitting: Family Medicine

## 2019-10-07 NOTE — Telephone Encounter (Signed)
Patient declined the Medicare Wellness Visit with [facility]  Was told the patient is not doing well.

## 2019-10-08 ENCOUNTER — Other Ambulatory Visit: Payer: Self-pay | Admitting: *Deleted

## 2019-10-08 MED ORDER — OXYCODONE HCL 5 MG PO TABS: 2.5000 mg | ORAL_TABLET | Freq: Four times a day (QID) | ORAL | 0 refills | Status: AC | PRN

## 2019-10-08 NOTE — Telephone Encounter (Signed)
April with hospice called reporting that patient needs refill of her Oxycodone and that she is actively dying and probably only needs a 3 day supply

## 2019-10-14 ENCOUNTER — Telehealth: Payer: Self-pay | Admitting: *Deleted

## 2019-10-17 NOTE — Telephone Encounter (Signed)
April, RN Hospice called to report that patient expired at 4 AM this morning.

## 2019-10-17 DEATH — deceased

## 2019-11-08 ENCOUNTER — Encounter: Payer: Medicare Other | Admitting: Hospice and Palliative Medicine

## 2019-11-08 ENCOUNTER — Ambulatory Visit: Payer: Medicare Other | Admitting: Oncology

## 2021-08-19 IMAGING — CT CT ABD-PELV W/O CM
2 of 4 series · 14 of 46 positions shown, 16 images · non-contrast
Comparison: Chest CT 7849, abdominal CT 0547

CLINICAL DATA: Follow-up lung nodule, abdominal pain

EXAM:
CT CHEST, ABDOMEN AND PELVIS WITHOUT CONTRAST
TECHNIQUE: Multidetector CT imaging of the chest, abdomen and pelvis was
performed following the standard protocol without IV contrast.

[Series 2: axials cap 5.00 · axial · 0.70mm/px · z∈[-1406,-911]mm · 11 of 119 slices shown, 13 images]
[im 10/119  soft-tissue]
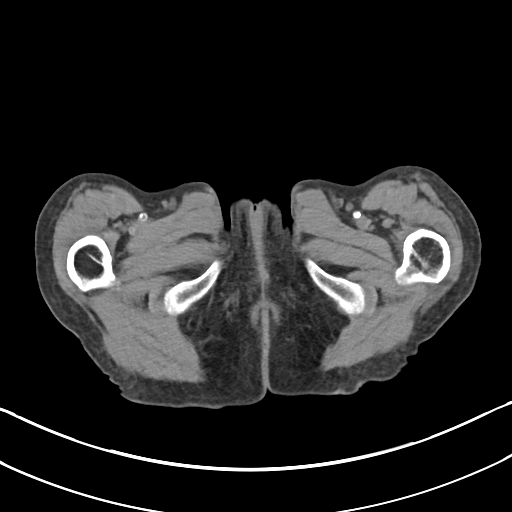
[im 10/119  bone]
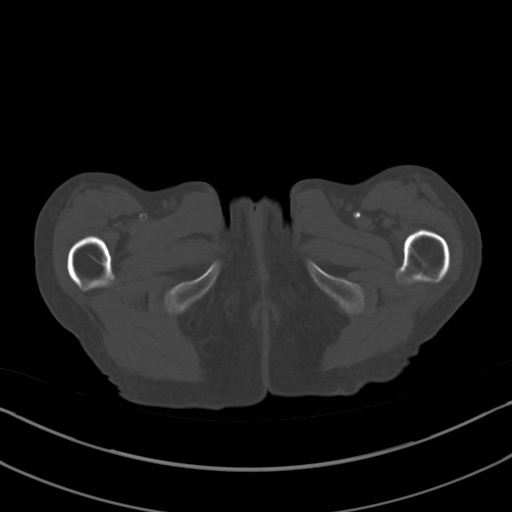
[im 19/119  soft-tissue]
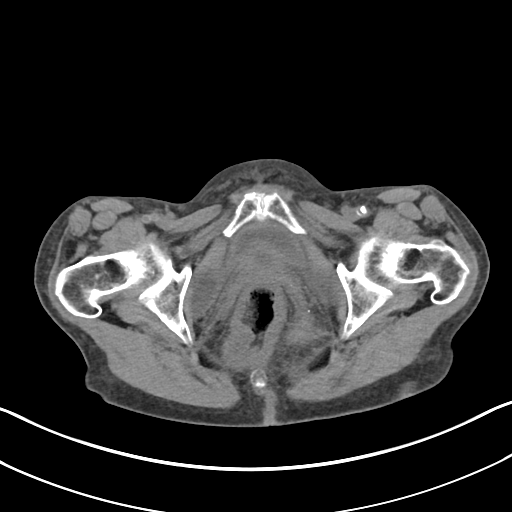
[im 28/119  soft-tissue]
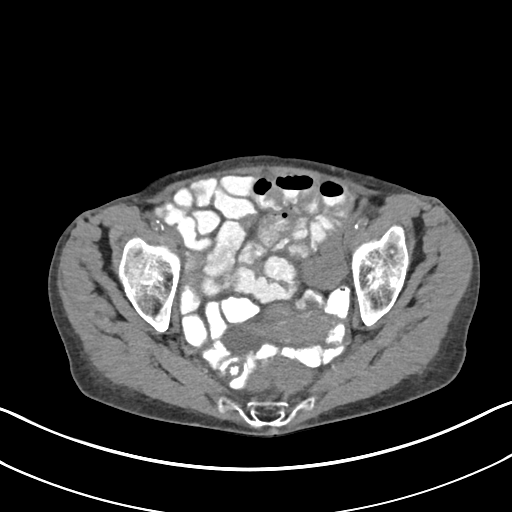
[im 37/119  soft-tissue]
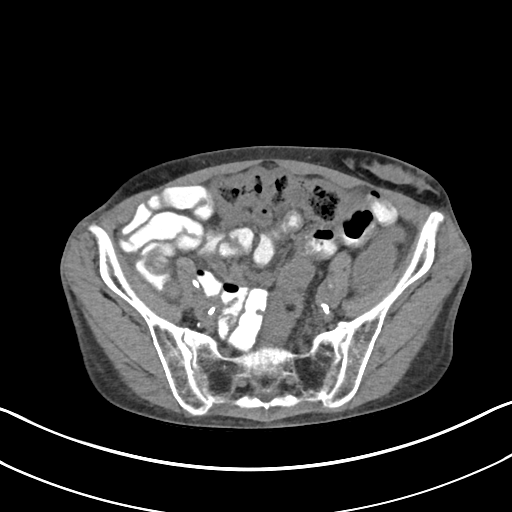
[im 46/119  soft-tissue]
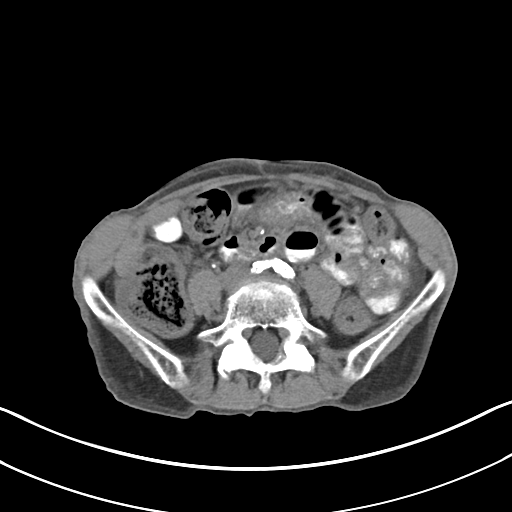
[im 64/119  soft-tissue]
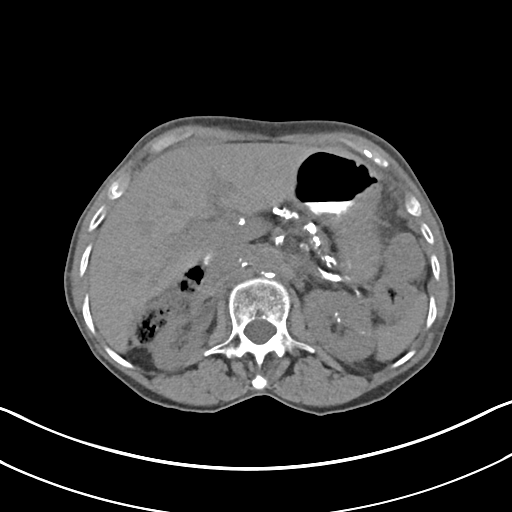
[im 73/119  soft-tissue]
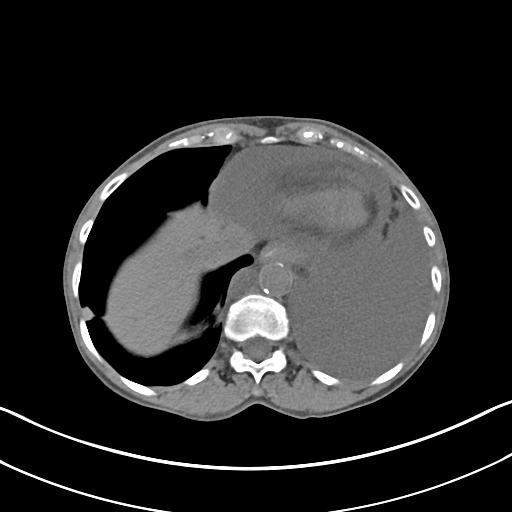
[im 82/119  soft-tissue]
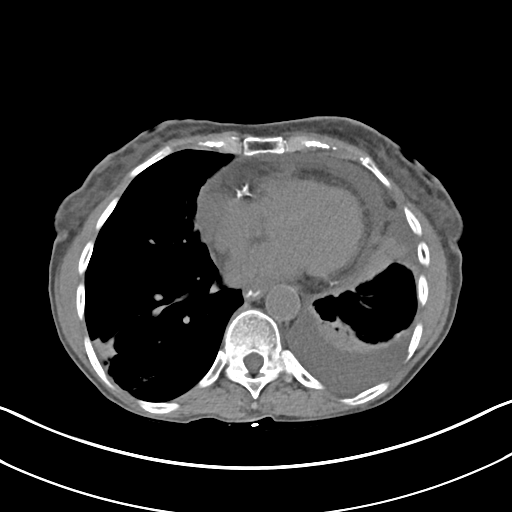
[im 91/119  soft-tissue]
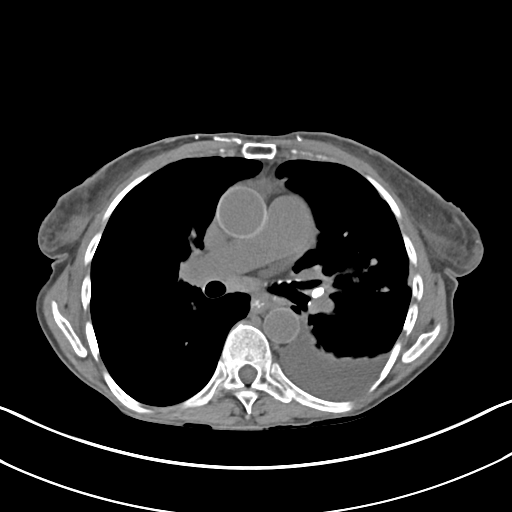
[im 91/119  bone]
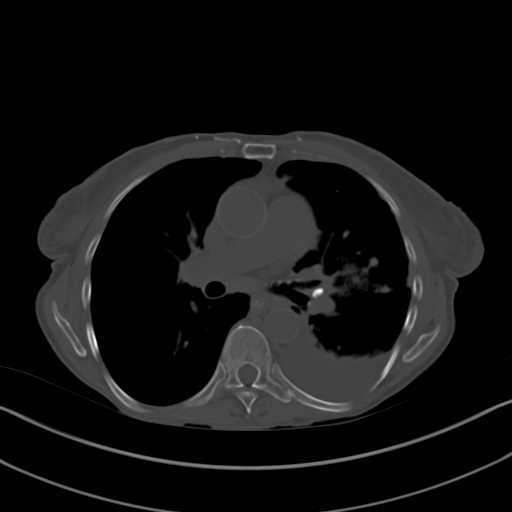
[im 100/119  soft-tissue]
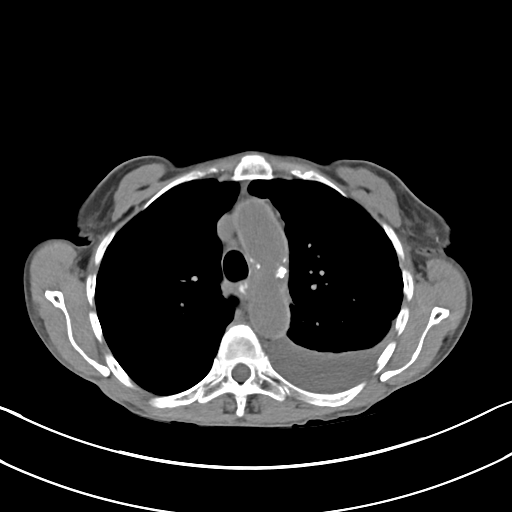
[im 109/119  soft-tissue]
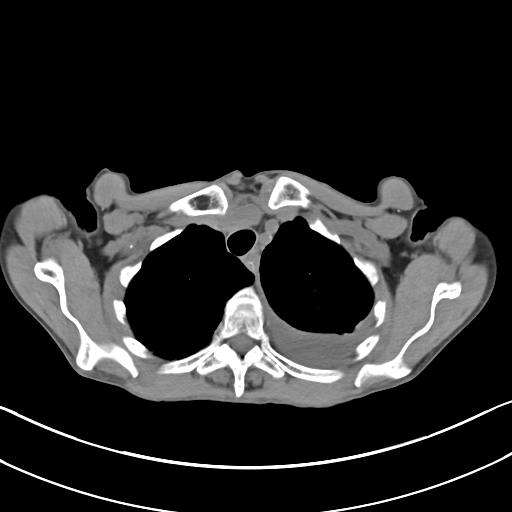

[Series 4: coronals cap 2.00 cor · coronal · 0.70mm/px · 3 of 129 slices shown]
[im 43/129  soft-tissue]
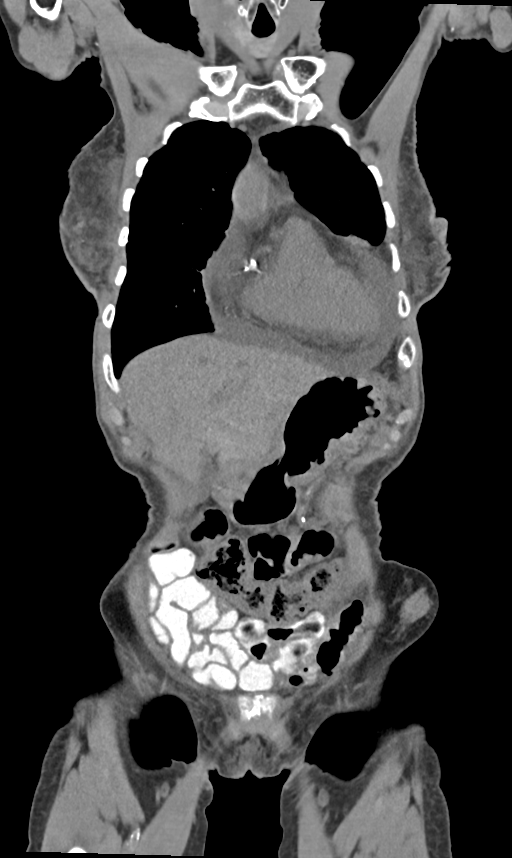
[im 57/129  soft-tissue]
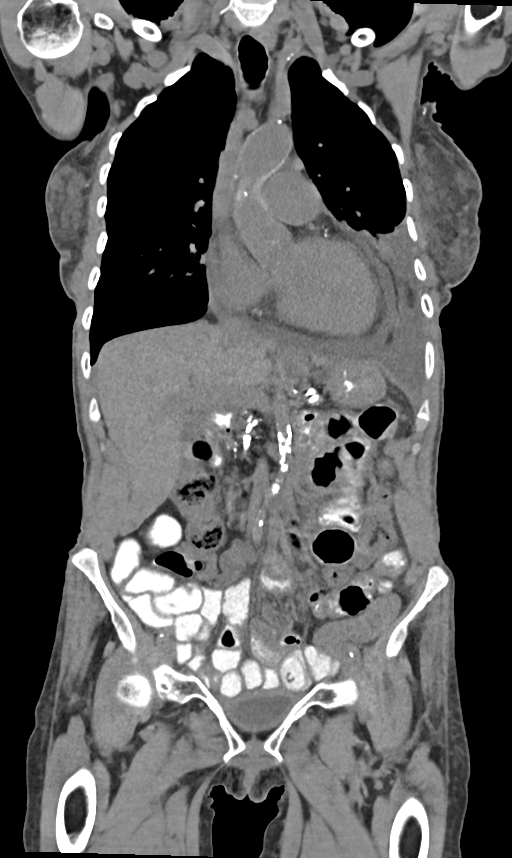
[im 72/129  soft-tissue]
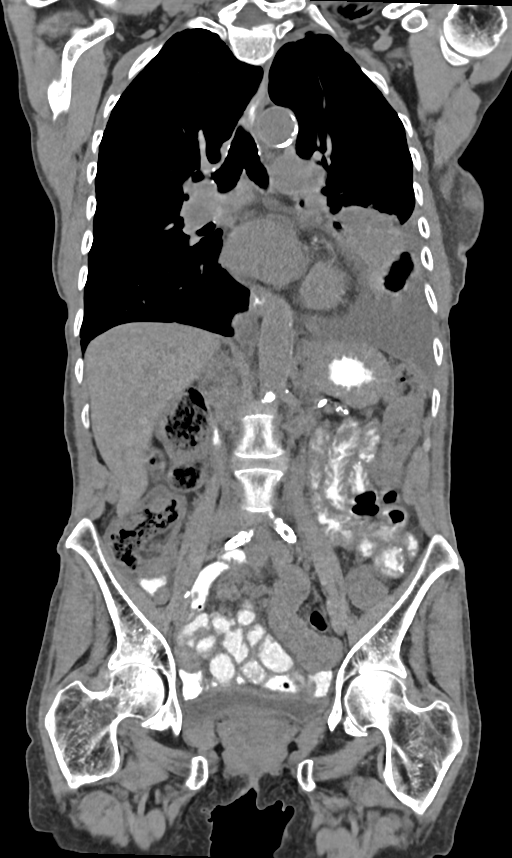

[14 of 46 positions shown; findings below may reference images not displayed]

FINDINGS: CT CHEST FINDINGS

Cardiovascular: Normal heart size. New moderate pericardial
effusion. Coronary and aortic calcification.

Mediastinum/Nodes: Possible enlarged node anterior to the carina
measuring 2.5 cm. There is also increased subcarinal soft tissue
density. Limited evaluation for hilar adenopathy due to lack of
intravenous contrast. There are calcified left hilar lymph nodes. No
axillary adenopathy.

Lungs/Pleura: New small to moderate left pleural effusion and
adjacent atelectasis. Masslike consolidation within the lingula
including the area of previously seen spiculated mass. Emphysema
with bullous changes. Soft tissue density associated with cystic
cavity of the right lower lobe again identified and is probably not
neoplastic.

Musculoskeletal: No aggressive osseous lesion.

CT ABDOMEN PELVIS FINDINGS

Hepatobiliary: Small hypoattenuating liver lesions were present on
0547 study and therefore benign. Gallbladder is unremarkable. No
biliary dilatation.

Pancreas: Atrophic and calcified likely reflecting sequelae of
chronic pancreatitis.

Spleen: Unremarkable.

Adrenals/Urinary Tract: Bilateral nonobstructing renal calculi.
Adrenals are unremarkable.

Stomach/Bowel: Stomach is within normal limits. Bowel is normal in
caliber. Appendix is not visualized.

Vascular/Lymphatic: Marked aortic atherosclerosis. No adenopathy
identified within limitation of noncontrast study.

Reproductive: Grossly unremarkable.

Other: Nonspecific soft tissue thickening in the subcutaneous fat of
the low ventral abdominal wall.

Musculoskeletal: No aggressive osseous lesion.
IMPRESSION: New small to moderate left pleural effusion with adjacent
atelectasis. Increase in size of masslike consolidation within the
lingula including the area of previously seen mass and likely
reflecting progression of disease.

New moderate pericardial effusion.

Possible enlarged precarinal and subcarinal lymph nodes. Evaluation
of the mediastinum and hila suboptimal on this noncontrast study.

Chronic intra-abdominal findings detailed above.

## 2021-08-19 IMAGING — CT CT CHEST W/O CM
2 of 4 series · 13 of 36 positions shown, 16 images · non-contrast
Comparison: Chest CT 7849, abdominal CT 0547

CLINICAL DATA: Follow-up lung nodule, abdominal pain

EXAM:
CT CHEST, ABDOMEN AND PELVIS WITHOUT CONTRAST
TECHNIQUE: Multidetector CT imaging of the chest, abdomen and pelvis was
performed following the standard protocol without IV contrast.

[Series 2: axials cap 5.00 · axial · 0.70mm/px · z∈[-1401,-916]mm · 10 of 119 slices shown, 13 images]
[im 11/119  mediastinal]
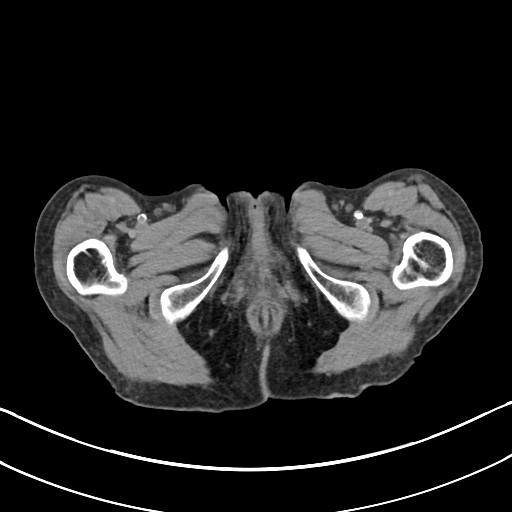
[im 11/119  lung]
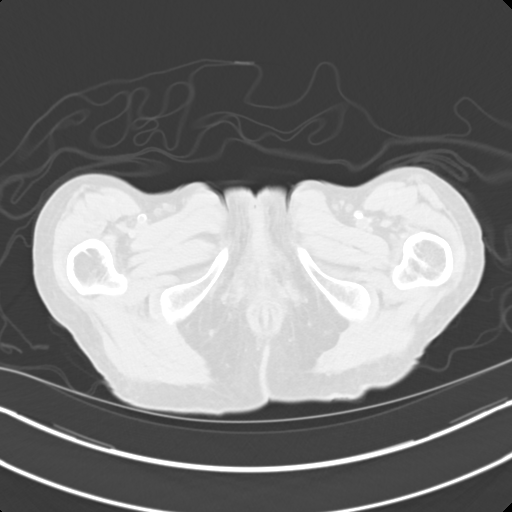
[im 22/119  lung]
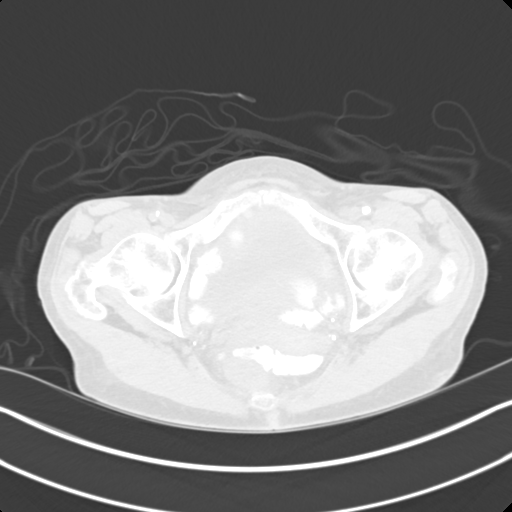
[im 33/119  lung]
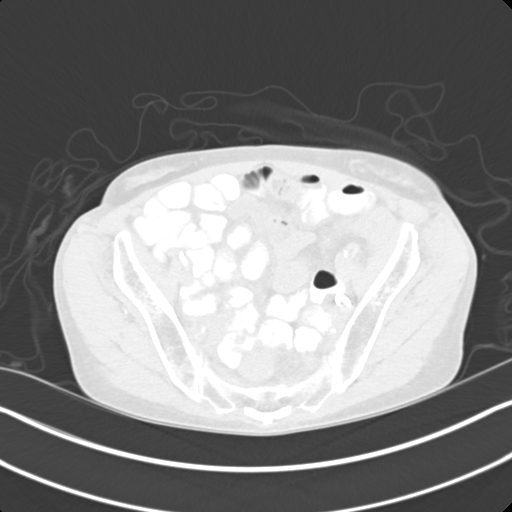
[im 43/119  lung]
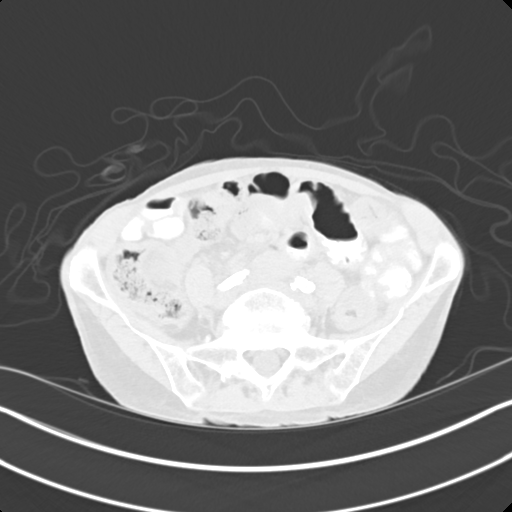
[im 54/119  mediastinal]
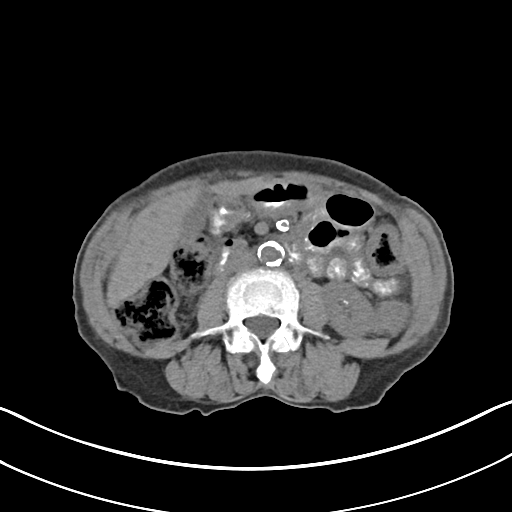
[im 54/119  lung]
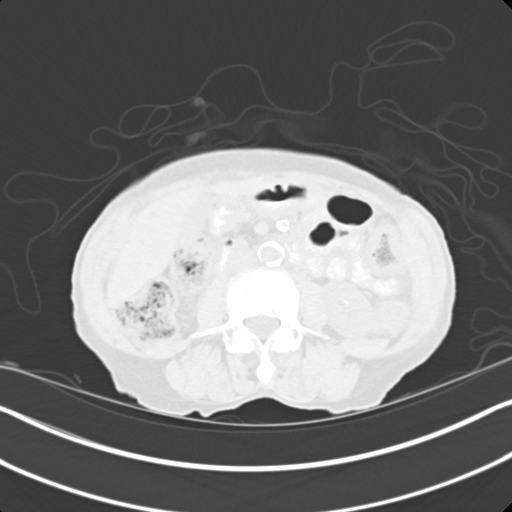
[im 65/119  lung]
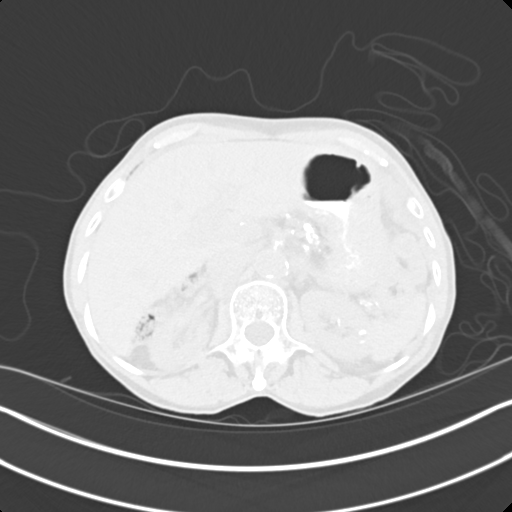
[im 76/119  lung]
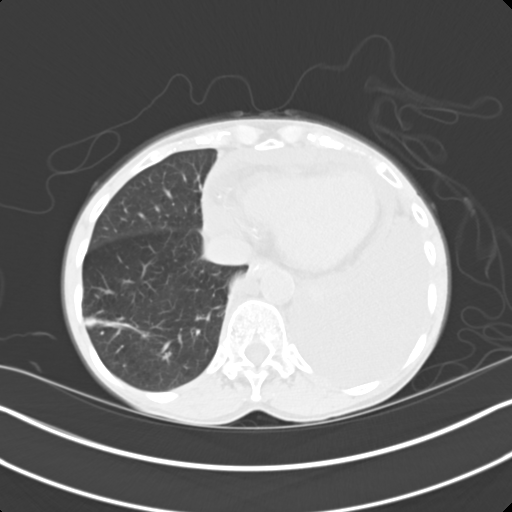
[im 86/119  lung]
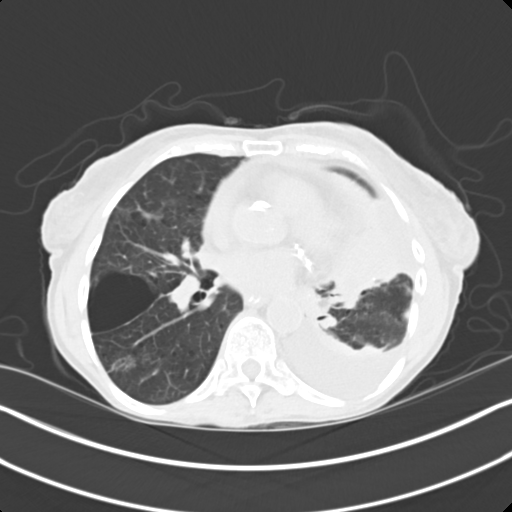
[im 97/119  mediastinal]
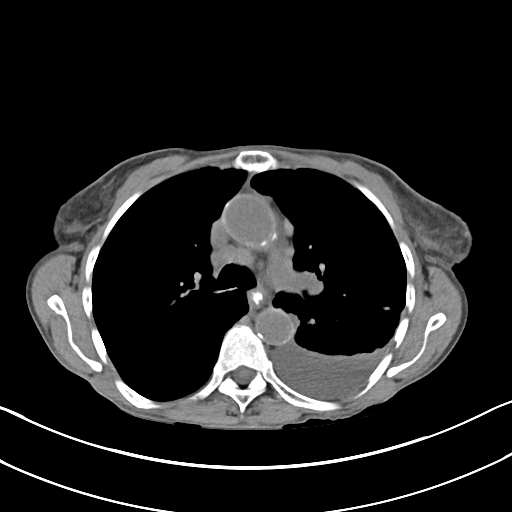
[im 97/119  lung]
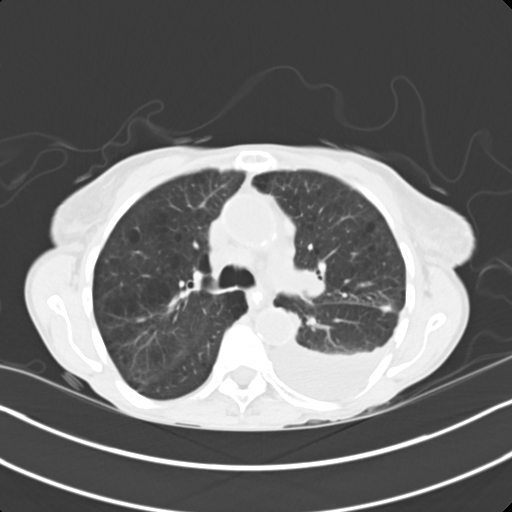
[im 108/119  lung]
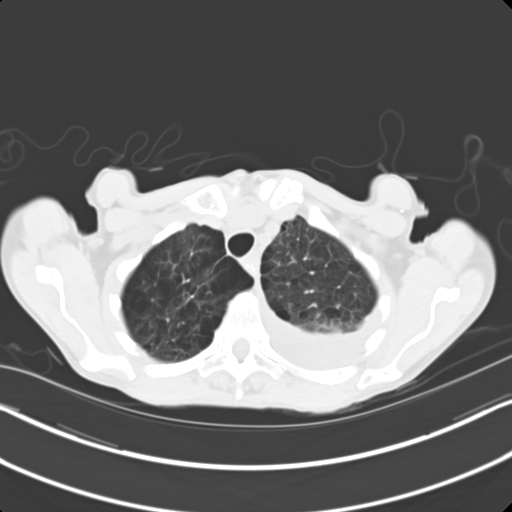

[Series 4: coronals cap 2.00 cor · coronal · 0.70mm/px · 3 of 129 slices shown]
[im 26/129  lung]
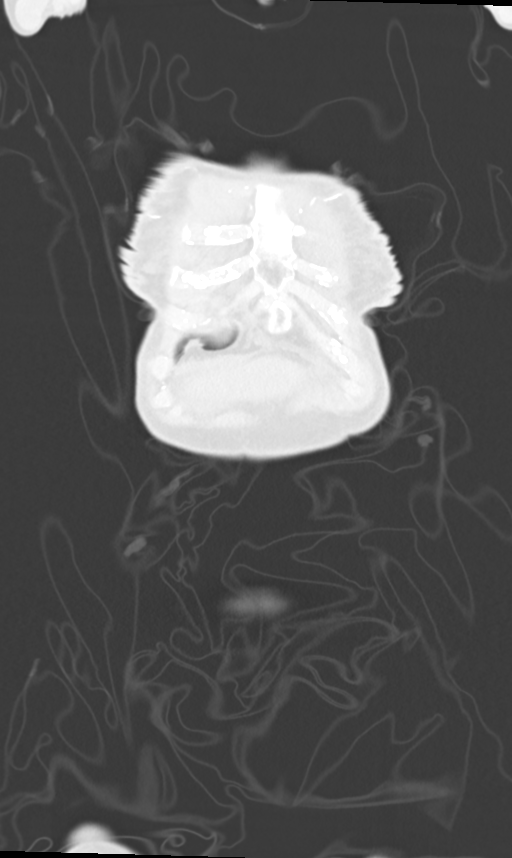
[im 52/129  lung]
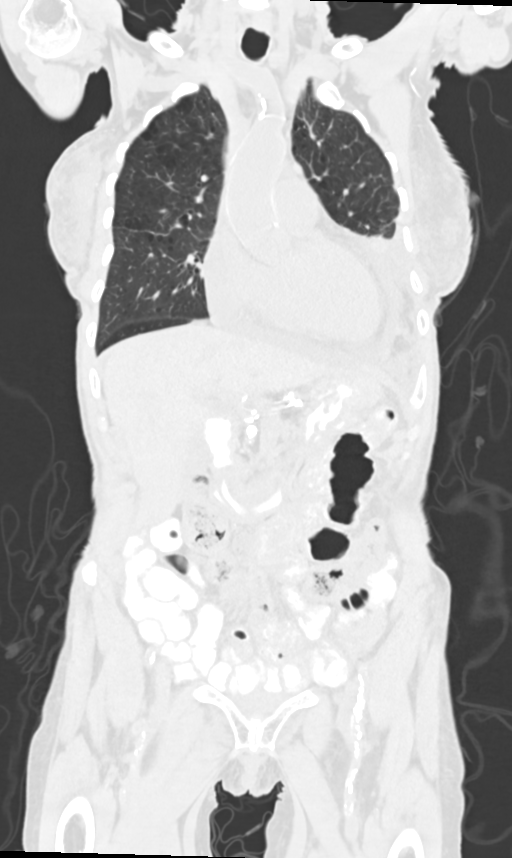
[im 77/129  lung]
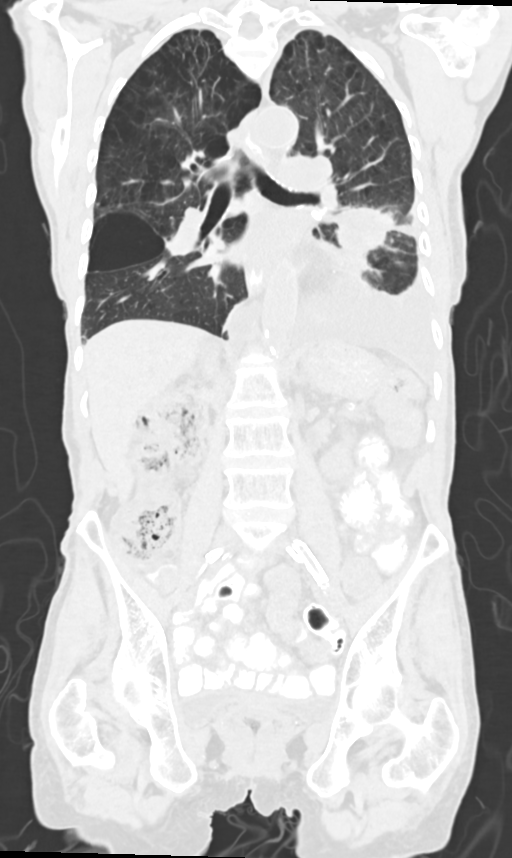

[13 of 36 positions shown; findings below may reference images not displayed]

FINDINGS: CT CHEST FINDINGS

Cardiovascular: Normal heart size. New moderate pericardial
effusion. Coronary and aortic calcification.

Mediastinum/Nodes: Possible enlarged node anterior to the carina
measuring 2.5 cm. There is also increased subcarinal soft tissue
density. Limited evaluation for hilar adenopathy due to lack of
intravenous contrast. There are calcified left hilar lymph nodes. No
axillary adenopathy.

Lungs/Pleura: New small to moderate left pleural effusion and
adjacent atelectasis. Masslike consolidation within the lingula
including the area of previously seen spiculated mass. Emphysema
with bullous changes. Soft tissue density associated with cystic
cavity of the right lower lobe again identified and is probably not
neoplastic.

Musculoskeletal: No aggressive osseous lesion.

CT ABDOMEN PELVIS FINDINGS

Hepatobiliary: Small hypoattenuating liver lesions were present on
0547 study and therefore benign. Gallbladder is unremarkable. No
biliary dilatation.

Pancreas: Atrophic and calcified likely reflecting sequelae of
chronic pancreatitis.

Spleen: Unremarkable.

Adrenals/Urinary Tract: Bilateral nonobstructing renal calculi.
Adrenals are unremarkable.

Stomach/Bowel: Stomach is within normal limits. Bowel is normal in
caliber. Appendix is not visualized.

Vascular/Lymphatic: Marked aortic atherosclerosis. No adenopathy
identified within limitation of noncontrast study.

Reproductive: Grossly unremarkable.

Other: Nonspecific soft tissue thickening in the subcutaneous fat of
the low ventral abdominal wall.

Musculoskeletal: No aggressive osseous lesion.
IMPRESSION: New small to moderate left pleural effusion with adjacent
atelectasis. Increase in size of masslike consolidation within the
lingula including the area of previously seen mass and likely
reflecting progression of disease.

New moderate pericardial effusion.

Possible enlarged precarinal and subcarinal lymph nodes. Evaluation
of the mediastinum and hila suboptimal on this noncontrast study.

Chronic intra-abdominal findings detailed above.

## 2021-08-29 IMAGING — CR DG CHEST 2V
2 series · 2 of 2 positions shown · non-contrast
Comparison: 04/16/2017, chest CT 06/15/2019

CLINICAL DATA: Lung nodule

EXAM:
CHEST - 2 VIEW

[chest lat]
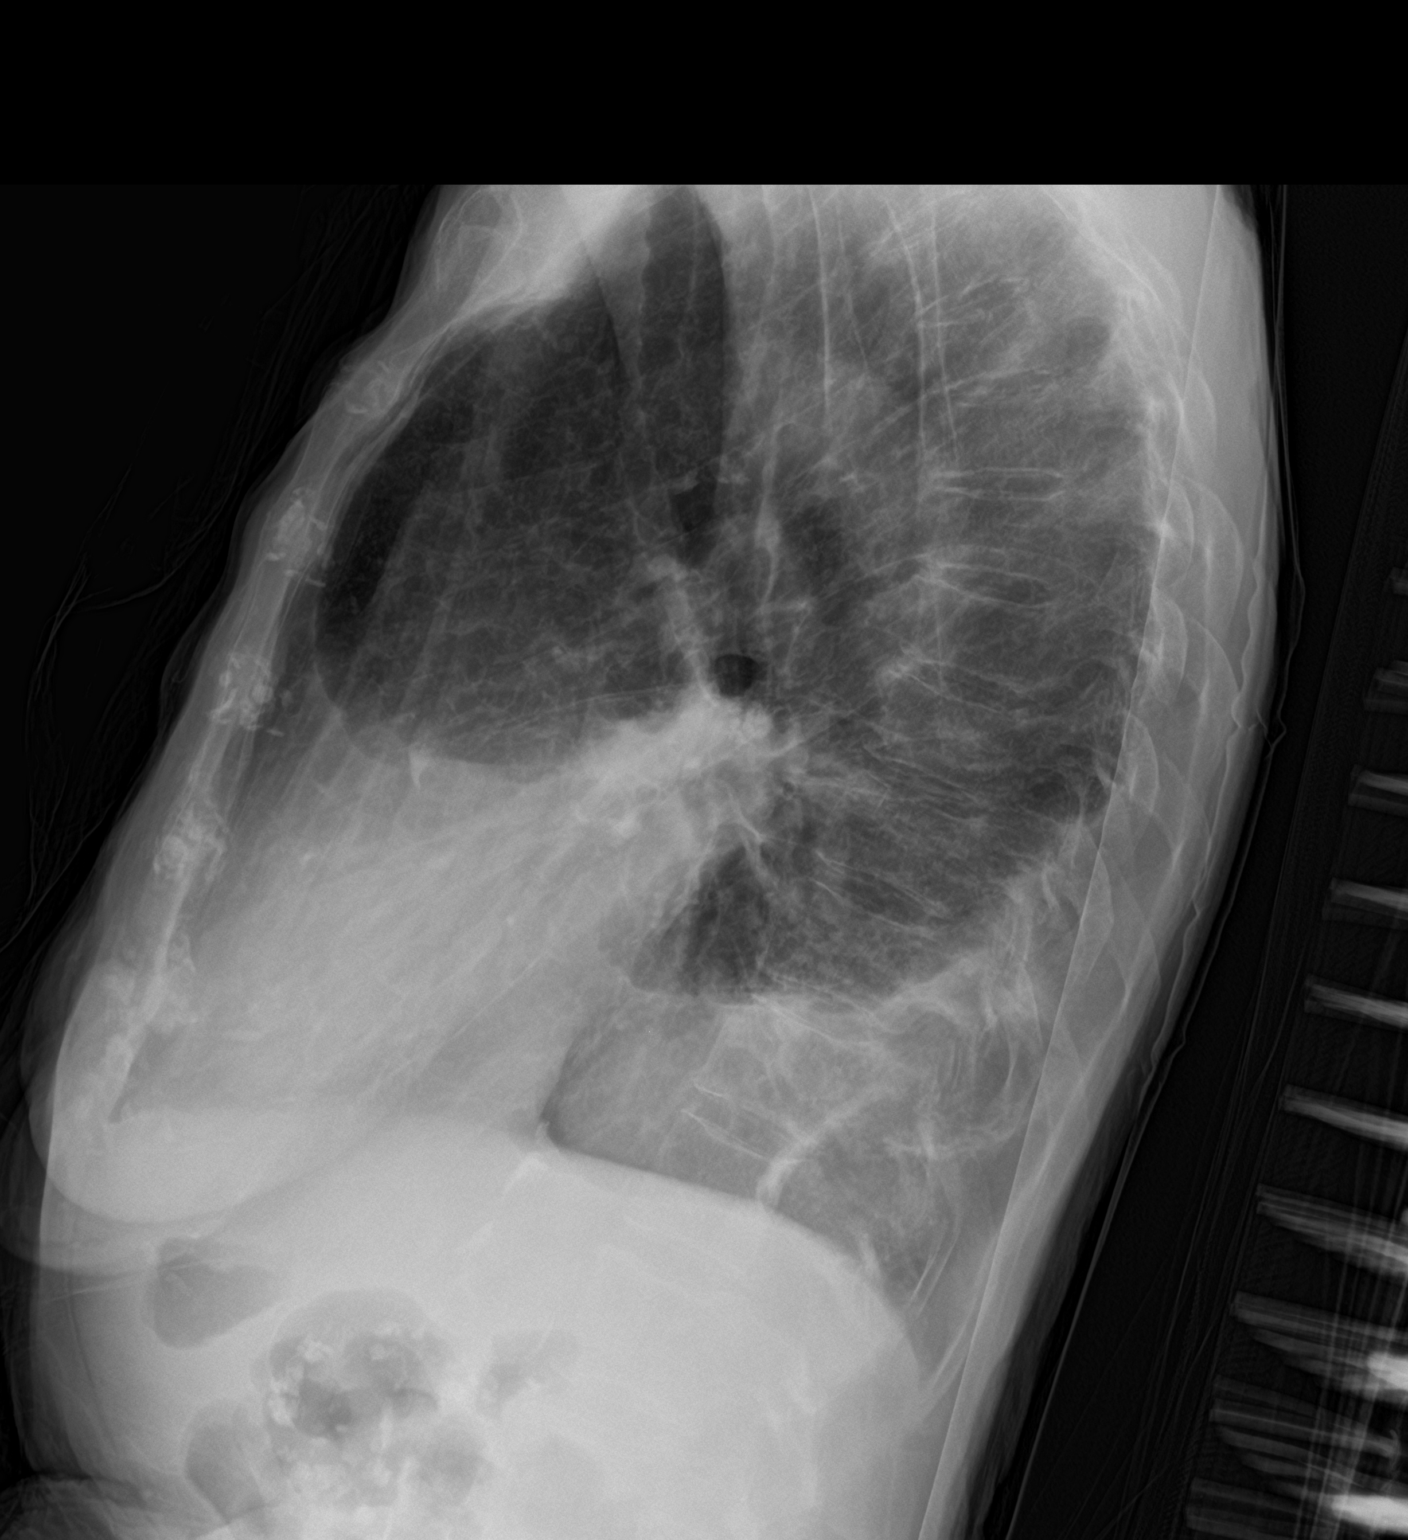

[chest ap]
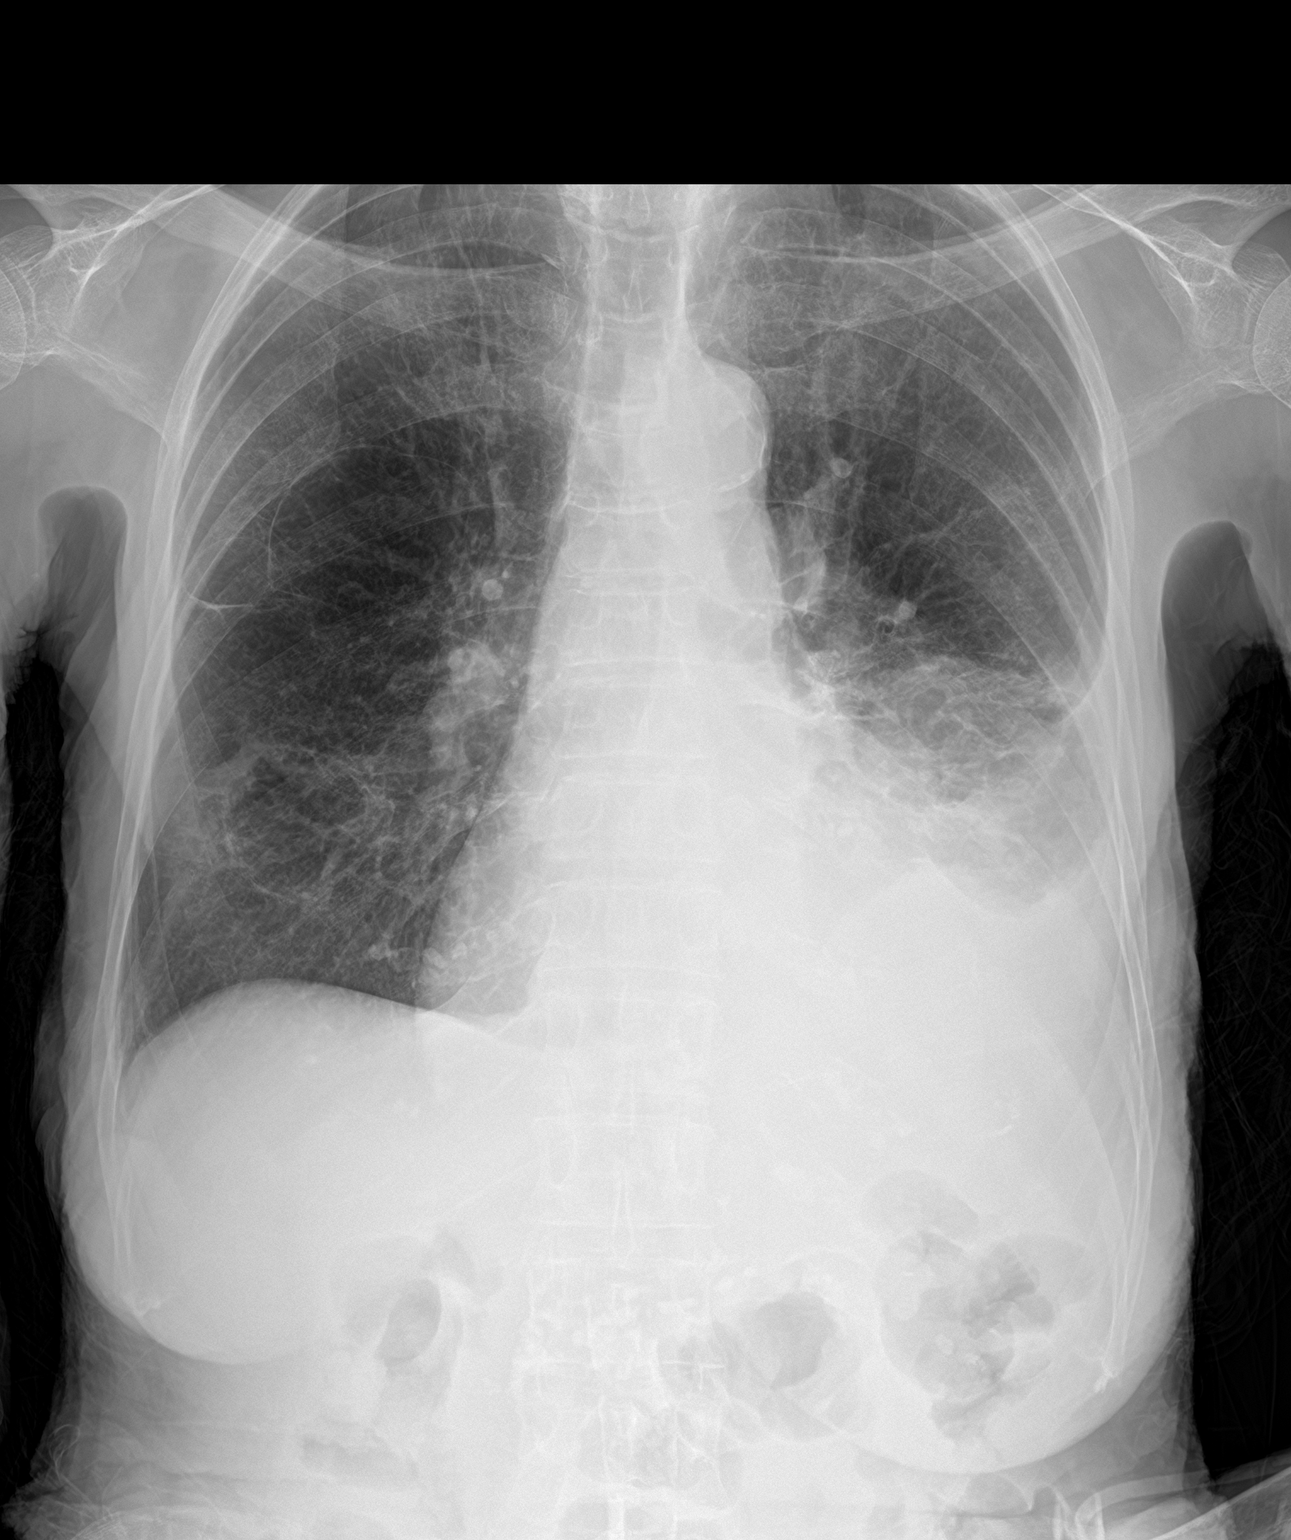

[2 of 2 positions shown; findings below may reference images not displayed]

FINDINGS: Left pleural effusion with left basilar atelectasis and lingular
consolidation better evaluated on recent chest CT. Emphysema. Normal
heart size. No acute osseous abnormality.
IMPRESSION: Left pleural effusion with left basilar atelectasis. Lingular
masslike consolidation better evaluated on recent CT.
